# Patient Record
Sex: Female | Born: 1980 | Race: White | Hispanic: No | Marital: Married | Smoking: Former smoker
Health system: Southern US, Community
[De-identification: ages and names within clinical notes are randomized; demographics above are authoritative.]

## PROBLEM LIST (undated history)

## (undated) DIAGNOSIS — B354 Tinea corporis: Secondary | ICD-10-CM

## (undated) DIAGNOSIS — K5790 Diverticulosis of intestine, part unspecified, without perforation or abscess without bleeding: Secondary | ICD-10-CM

## (undated) DIAGNOSIS — F329 Major depressive disorder, single episode, unspecified: Secondary | ICD-10-CM

## (undated) DIAGNOSIS — G40909 Epilepsy, unspecified, not intractable, without status epilepticus: Secondary | ICD-10-CM

## (undated) DIAGNOSIS — F32A Depression, unspecified: Secondary | ICD-10-CM

## (undated) DIAGNOSIS — Z9884 Bariatric surgery status: Secondary | ICD-10-CM

## (undated) DIAGNOSIS — G4730 Sleep apnea, unspecified: Secondary | ICD-10-CM

## (undated) DIAGNOSIS — G40409 Other generalized epilepsy and epileptic syndromes, not intractable, without status epilepticus: Secondary | ICD-10-CM

## (undated) DIAGNOSIS — F419 Anxiety disorder, unspecified: Secondary | ICD-10-CM

## (undated) DIAGNOSIS — J45909 Unspecified asthma, uncomplicated: Secondary | ICD-10-CM

## (undated) DIAGNOSIS — F319 Bipolar disorder, unspecified: Secondary | ICD-10-CM

## (undated) DIAGNOSIS — R0600 Dyspnea, unspecified: Secondary | ICD-10-CM

## (undated) DIAGNOSIS — E119 Type 2 diabetes mellitus without complications: Secondary | ICD-10-CM

## (undated) DIAGNOSIS — J189 Pneumonia, unspecified organism: Secondary | ICD-10-CM

## (undated) DIAGNOSIS — J449 Chronic obstructive pulmonary disease, unspecified: Secondary | ICD-10-CM

## (undated) DIAGNOSIS — G43909 Migraine, unspecified, not intractable, without status migrainosus: Secondary | ICD-10-CM

## (undated) HISTORY — PX: CARPAL TUNNEL RELEASE: SHX101

## (undated) HISTORY — DX: Anxiety disorder, unspecified: F41.9

## (undated) HISTORY — PX: HERNIA REPAIR: SHX51

## (undated) HISTORY — DX: Tinea corporis: B35.4

## (undated) HISTORY — DX: Diverticulosis of intestine, part unspecified, without perforation or abscess without bleeding: K57.90

## (undated) HISTORY — DX: Chronic obstructive pulmonary disease, unspecified: J44.9

## (undated) HISTORY — DX: Bariatric surgery status: Z98.84

## (undated) HISTORY — PX: OTHER SURGICAL HISTORY: SHX169

---

## 2001-03-15 ENCOUNTER — Encounter: Payer: Self-pay | Admitting: Emergency Medicine

## 2001-03-15 ENCOUNTER — Emergency Department (HOSPITAL_COMMUNITY): Admission: EM | Admit: 2001-03-15 | Discharge: 2001-03-15 | Payer: Self-pay | Admitting: Emergency Medicine

## 2002-04-07 ENCOUNTER — Emergency Department (HOSPITAL_COMMUNITY): Admission: EM | Admit: 2002-04-07 | Discharge: 2002-04-08 | Payer: Self-pay

## 2002-08-03 ENCOUNTER — Encounter: Payer: Self-pay | Admitting: Emergency Medicine

## 2002-08-03 ENCOUNTER — Encounter: Payer: Self-pay | Admitting: Internal Medicine

## 2002-08-03 ENCOUNTER — Inpatient Hospital Stay (HOSPITAL_COMMUNITY): Admission: EM | Admit: 2002-08-03 | Discharge: 2002-08-05 | Payer: Self-pay

## 2002-08-05 ENCOUNTER — Encounter: Payer: Self-pay | Admitting: Internal Medicine

## 2002-08-07 ENCOUNTER — Emergency Department (HOSPITAL_COMMUNITY): Admission: EM | Admit: 2002-08-07 | Discharge: 2002-08-07 | Payer: Self-pay | Admitting: *Deleted

## 2002-08-22 ENCOUNTER — Ambulatory Visit (HOSPITAL_COMMUNITY): Admission: RE | Admit: 2002-08-22 | Discharge: 2002-08-22 | Payer: Self-pay | Admitting: Infectious Diseases

## 2002-08-22 ENCOUNTER — Encounter: Payer: Self-pay | Admitting: Infectious Diseases

## 2005-10-31 ENCOUNTER — Emergency Department (HOSPITAL_COMMUNITY): Admission: EM | Admit: 2005-10-31 | Discharge: 2005-10-31 | Payer: Self-pay | Admitting: Emergency Medicine

## 2006-04-07 ENCOUNTER — Ambulatory Visit: Payer: Self-pay | Admitting: Obstetrics & Gynecology

## 2006-04-08 ENCOUNTER — Ambulatory Visit: Payer: Self-pay | Admitting: Obstetrics & Gynecology

## 2006-04-08 ENCOUNTER — Encounter: Payer: Self-pay | Admitting: Obstetrics & Gynecology

## 2006-04-09 ENCOUNTER — Ambulatory Visit (HOSPITAL_COMMUNITY): Admission: RE | Admit: 2006-04-09 | Discharge: 2006-04-09 | Payer: Self-pay | Admitting: Gynecology

## 2006-04-15 ENCOUNTER — Ambulatory Visit: Payer: Self-pay | Admitting: Obstetrics & Gynecology

## 2006-04-26 ENCOUNTER — Ambulatory Visit: Payer: Self-pay | Admitting: Obstetrics & Gynecology

## 2006-05-12 ENCOUNTER — Ambulatory Visit (HOSPITAL_COMMUNITY): Admission: RE | Admit: 2006-05-12 | Discharge: 2006-05-12 | Payer: Self-pay | Admitting: Gynecology

## 2006-05-13 ENCOUNTER — Ambulatory Visit: Payer: Self-pay | Admitting: Obstetrics & Gynecology

## 2006-05-20 ENCOUNTER — Ambulatory Visit: Payer: Self-pay | Admitting: Obstetrics & Gynecology

## 2006-06-19 ENCOUNTER — Inpatient Hospital Stay (HOSPITAL_COMMUNITY): Admission: AD | Admit: 2006-06-19 | Discharge: 2006-06-19 | Payer: Self-pay | Admitting: Obstetrics & Gynecology

## 2006-07-01 ENCOUNTER — Ambulatory Visit: Payer: Self-pay | Admitting: Obstetrics & Gynecology

## 2006-07-15 ENCOUNTER — Ambulatory Visit: Payer: Self-pay | Admitting: Family Medicine

## 2006-07-29 ENCOUNTER — Ambulatory Visit: Payer: Self-pay | Admitting: Obstetrics & Gynecology

## 2006-08-03 ENCOUNTER — Ambulatory Visit (HOSPITAL_COMMUNITY): Admission: RE | Admit: 2006-08-03 | Discharge: 2006-08-03 | Payer: Self-pay | Admitting: Gynecology

## 2006-08-05 ENCOUNTER — Ambulatory Visit: Payer: Self-pay | Admitting: Obstetrics & Gynecology

## 2006-08-19 ENCOUNTER — Ambulatory Visit: Payer: Self-pay | Admitting: Obstetrics & Gynecology

## 2006-08-19 ENCOUNTER — Ambulatory Visit (HOSPITAL_COMMUNITY): Admission: RE | Admit: 2006-08-19 | Discharge: 2006-08-19 | Payer: Self-pay | Admitting: Obstetrics & Gynecology

## 2006-08-19 ENCOUNTER — Inpatient Hospital Stay (HOSPITAL_COMMUNITY): Admission: AD | Admit: 2006-08-19 | Discharge: 2006-08-19 | Payer: Self-pay | Admitting: Obstetrics & Gynecology

## 2006-08-26 ENCOUNTER — Ambulatory Visit: Payer: Self-pay | Admitting: Obstetrics & Gynecology

## 2006-09-16 ENCOUNTER — Inpatient Hospital Stay (HOSPITAL_COMMUNITY): Admission: AD | Admit: 2006-09-16 | Discharge: 2006-09-16 | Payer: Self-pay | Admitting: Family Medicine

## 2006-09-16 ENCOUNTER — Ambulatory Visit: Payer: Self-pay | Admitting: Family Medicine

## 2006-09-16 ENCOUNTER — Ambulatory Visit: Payer: Self-pay | Admitting: Obstetrics & Gynecology

## 2006-09-23 ENCOUNTER — Ambulatory Visit: Payer: Self-pay | Admitting: Obstetrics & Gynecology

## 2006-09-30 ENCOUNTER — Inpatient Hospital Stay (HOSPITAL_COMMUNITY): Admission: AD | Admit: 2006-09-30 | Discharge: 2006-10-02 | Payer: Self-pay | Admitting: Family Medicine

## 2006-09-30 ENCOUNTER — Ambulatory Visit: Payer: Self-pay | Admitting: Obstetrics & Gynecology

## 2006-11-18 ENCOUNTER — Ambulatory Visit: Payer: Self-pay | Admitting: Obstetrics & Gynecology

## 2006-11-26 ENCOUNTER — Emergency Department: Payer: Self-pay | Admitting: Emergency Medicine

## 2007-03-24 ENCOUNTER — Emergency Department: Payer: Self-pay | Admitting: Emergency Medicine

## 2007-04-14 ENCOUNTER — Encounter: Payer: Self-pay | Admitting: Obstetrics & Gynecology

## 2007-04-14 ENCOUNTER — Ambulatory Visit: Payer: Self-pay | Admitting: Obstetrics & Gynecology

## 2007-06-23 ENCOUNTER — Ambulatory Visit: Payer: Self-pay | Admitting: Obstetrics & Gynecology

## 2007-07-28 ENCOUNTER — Ambulatory Visit: Payer: Self-pay | Admitting: Obstetrics & Gynecology

## 2008-03-15 ENCOUNTER — Encounter: Payer: Self-pay | Admitting: Obstetrics & Gynecology

## 2008-03-15 ENCOUNTER — Ambulatory Visit: Payer: Self-pay | Admitting: Obstetrics & Gynecology

## 2008-05-16 ENCOUNTER — Ambulatory Visit: Payer: Self-pay | Admitting: Obstetrics & Gynecology

## 2008-05-30 ENCOUNTER — Ambulatory Visit: Payer: Self-pay | Admitting: Obstetrics & Gynecology

## 2008-06-01 ENCOUNTER — Encounter: Admission: RE | Admit: 2008-06-01 | Discharge: 2008-06-01 | Payer: Self-pay | Admitting: Obstetrics & Gynecology

## 2009-07-15 ENCOUNTER — Encounter: Payer: Self-pay | Admitting: Obstetrics & Gynecology

## 2009-07-15 ENCOUNTER — Ambulatory Visit: Payer: Self-pay | Admitting: Obstetrics & Gynecology

## 2009-09-04 ENCOUNTER — Ambulatory Visit: Payer: Self-pay | Admitting: Obstetrics & Gynecology

## 2010-01-09 ENCOUNTER — Ambulatory Visit: Payer: Self-pay | Admitting: Unknown Physician Specialty

## 2010-05-21 ENCOUNTER — Ambulatory Visit: Payer: Self-pay | Admitting: Unknown Physician Specialty

## 2010-09-01 ENCOUNTER — Emergency Department (HOSPITAL_COMMUNITY): Admission: EM | Admit: 2010-09-01 | Discharge: 2010-09-02 | Payer: Self-pay | Admitting: Emergency Medicine

## 2010-09-01 ENCOUNTER — Ambulatory Visit: Payer: Self-pay | Admitting: Internal Medicine

## 2010-10-01 ENCOUNTER — Ambulatory Visit: Payer: Self-pay | Admitting: Obstetrics & Gynecology

## 2010-12-21 ENCOUNTER — Emergency Department (HOSPITAL_COMMUNITY)
Admission: EM | Admit: 2010-12-21 | Discharge: 2010-12-22 | Payer: Self-pay | Source: Home / Self Care | Admitting: Internal Medicine

## 2010-12-29 LAB — CBC
HCT: 41.1 % (ref 36.0–46.0)
Hemoglobin: 13.5 g/dL (ref 12.0–15.0)
MCH: 28 pg (ref 26.0–34.0)
MCHC: 32.8 g/dL (ref 30.0–36.0)
MCV: 85.1 fL (ref 78.0–100.0)
Platelets: 275 10*3/uL (ref 150–400)
RBC: 4.83 MIL/uL (ref 3.87–5.11)
RDW: 13 % (ref 11.5–15.5)
WBC: 9.8 10*3/uL (ref 4.0–10.5)

## 2010-12-29 LAB — COMPREHENSIVE METABOLIC PANEL
ALT: 44 U/L — ABNORMAL HIGH (ref 0–35)
AST: 23 U/L (ref 0–37)
Albumin: 3.6 g/dL (ref 3.5–5.2)
Alkaline Phosphatase: 61 U/L (ref 39–117)
BUN: 10 mg/dL (ref 6–23)
CO2: 25 mEq/L (ref 19–32)
Calcium: 9 mg/dL (ref 8.4–10.5)
Chloride: 105 mEq/L (ref 96–112)
Creatinine, Ser: 0.68 mg/dL (ref 0.4–1.2)
GFR calc Af Amer: >60 mL/min (ref >60)
GFR calc non Af Amer: >60 mL/min (ref >60)
Glucose, Bld: 141 mg/dL — ABNORMAL HIGH (ref 70–99)
Potassium: 3.7 mEq/L (ref 3.5–5.1)
Sodium: 138 mEq/L (ref 135–145)
Total Bilirubin: 0.3 mg/dL (ref 0.3–1.2)
Total Protein: 6 g/dL (ref 6.0–8.3)

## 2010-12-29 LAB — POCT I-STAT, CHEM 8
BUN: 12 mg/dL (ref 6–23)
Calcium, Ion: 1.14 mmol/L (ref 1.12–1.32)
Chloride: 104 mEq/L (ref 96–112)
Creatinine, Ser: 0.7 mg/dL (ref 0.4–1.2)
Glucose, Bld: 138 mg/dL — ABNORMAL HIGH (ref 70–99)
HCT: 41 % (ref 36.0–46.0)
Hemoglobin: 13.9 g/dL (ref 12.0–15.0)
Potassium: 3.6 mEq/L (ref 3.5–5.1)
Sodium: 138 mEq/L (ref 135–145)
TCO2: 25 mmol/L (ref 0–100)

## 2010-12-29 LAB — DIFFERENTIAL
Basophils Absolute: 0.1 10*3/uL (ref 0.0–0.1)
Basophils Relative: 1 % (ref 0–1)
Eosinophils Absolute: 0.3 10*3/uL (ref 0.0–0.7)
Eosinophils Relative: 3 % (ref 0–5)
Lymphocytes Relative: 30 % (ref 12–46)
Lymphs Abs: 2.9 10*3/uL (ref 0.7–4.0)
Monocytes Absolute: 0.6 10*3/uL (ref 0.1–1.0)
Monocytes Relative: 7 % (ref 3–12)
Neutro Abs: 5.8 10*3/uL (ref 1.7–7.7)
Neutrophils Relative %: 60 % (ref 43–77)

## 2010-12-29 LAB — URINALYSIS, ROUTINE W REFLEX MICROSCOPIC
Bilirubin Urine: NEGATIVE
Ketones, ur: NEGATIVE mg/dL
Leukocytes, UA: NEGATIVE
Nitrite: NEGATIVE
Protein, ur: NEGATIVE mg/dL
Specific Gravity, Urine: 1.024 (ref 1.005–1.030)
Urine Glucose, Fasting: NEGATIVE mg/dL
Urobilinogen, UA: 0.2 mg/dL (ref 0.0–1.0)
pH: 6 (ref 5.0–8.0)

## 2010-12-29 LAB — URINE MICROSCOPIC-ADD ON

## 2010-12-29 LAB — POCT PREGNANCY, URINE: Preg Test, Ur: NEGATIVE

## 2011-01-06 ENCOUNTER — Ambulatory Visit: Payer: Self-pay | Admitting: Neurology

## 2011-01-07 ENCOUNTER — Ambulatory Visit: Payer: Self-pay | Admitting: Neurology

## 2011-02-26 LAB — DIFFERENTIAL
Basophils Absolute: 0.1 10*3/uL (ref 0.0–0.1)
Lymphocytes Relative: 34 % (ref 12–46)
Lymphs Abs: 3.3 10*3/uL (ref 0.7–4.0)
Neutro Abs: 5.5 10*3/uL (ref 1.7–7.7)
Neutrophils Relative %: 57 % (ref 43–77)

## 2011-02-26 LAB — BASIC METABOLIC PANEL
BUN: 8 mg/dL (ref 6–23)
CO2: 27 mEq/L (ref 19–32)
Calcium: 9.1 mg/dL (ref 8.4–10.5)
Chloride: 105 mEq/L (ref 96–112)
Creatinine, Ser: 0.74 mg/dL (ref 0.4–1.2)
GFR calc Af Amer: >60 mL/min (ref >60)
GFR calc non Af Amer: >60 mL/min (ref >60)
Glucose, Bld: 123 mg/dL — ABNORMAL HIGH (ref 70–99)
Potassium: 3.7 mEq/L (ref 3.5–5.1)
Sodium: 139 mEq/L (ref 135–145)

## 2011-02-26 LAB — RAPID URINE DRUG SCREEN, HOSP PERFORMED
Amphetamines: NOT DETECTED
Barbiturates: NOT DETECTED
Benzodiazepines: NOT DETECTED
Cocaine: NOT DETECTED
Opiates: NOT DETECTED

## 2011-02-26 LAB — CBC
MCV: 84.5 fL (ref 78.0–100.0)
Platelets: 292 10*3/uL (ref 150–400)
RBC: 4.93 MIL/uL (ref 3.87–5.11)
RDW: 13.2 % (ref 11.5–15.5)
WBC: 9.7 10*3/uL (ref 4.0–10.5)

## 2011-02-26 LAB — ETHANOL: Alcohol, Ethyl (B): <5 mg/dL (ref 0–10)

## 2011-03-01 ENCOUNTER — Other Ambulatory Visit: Payer: Self-pay | Admitting: Family Medicine

## 2011-03-02 NOTE — Telephone Encounter (Signed)
Refill request

## 2011-04-28 NOTE — Assessment & Plan Note (Signed)
NAMEPHILLIP, [name] NO.:  [id number]   MEDICAL RECORD NO.:  [id number]          PATIENT TYPE:  POB   LOCATION:  [facility] at [facility]         FACILITY:  [facility]   PHYSICIAN:  Scheryl Darter, MD       DATE OF BIRTH:  [birth year]   DATE OF SERVICE:  09/04/2009                                  CLINIC NOTE   The patient comes for insertion of Mirena.  The patient is a 30-year-  old, white female, gravida 1, para 1, last menstrual period 2 weeks ago  uses condoms for birth control.  She had a Mirena in place for about 2  years and was removed year ago when she had planned to conceive, but she  now wishes to have IUD replaced.  She says she did well with Mirena, had  extremely little menstrual bleeding.  She understands method of birth  control and the risks of bleeding, infection, uterine damage, pain or  failure.  She signed consent.  She states that she has had intercourse  since her last menses, but she use condoms.  She had negative pregnancy  test today.   PHYSICAL EXAMINATION:  GENERAL:  No acute distress.  Normal affect.  PELVIC:  External genitalia, vagina, and cervix appeared normal.  Cervix  was prepped with Betadine.  Single-tooth tenaculum grasped the anterior  lip of the cervix.  Uterus sounded to 8 cm.  Mirena was placed in the  usual fashion without problems.  String was trimmed to about 2-1/2 cm.   The patient tolerated the procedure well without complications.  She  will return in about a month to check placement of IUD, but she reported  she has fever, pain, abnormal discharge, or heavy bleeding.      Scheryl Darter, MD     JA/MEDQ  D:  09/04/2009  T:  09/05/2009  Job:  [id number]

## 2011-04-28 NOTE — Assessment & Plan Note (Signed)
Rachel [name], Rachel [name]                  ACCOUNT NO.:  [id number]   MEDICAL RECORD NO.:  [id number]          PATIENT TYPE:  POB   LOCATION:  [facility] at [facility]         FACILITY:  [facility]   PHYSICIAN:  Elsie Lincoln, MD      DATE OF BIRTH:  1981-11-24   DATE OF SERVICE:  10/01/2010                                  CLINIC NOTE   The patient comes back after a visit to the [facility]  for suicide ideations and the patient's mother had called me on the  emergency OB line saying that the patient had a plan for hurting  herself, that is why I advised her to go to [facility].  Unfortunately, she had a very bad experience there.  They had her wait 5  hours and the psychiatrist spent 5 minutes with her and suggested her to  go to [facility] for counseling.  Since she lives in [place],  this was obviously not suitable for her.  She found counseling on her  own at [facility] in [place].  She is seeing a  psychiatrist and a counselor there.  They diagnosed her with depressive  bipolar and placed her on Lamictal for stabilization.  She is also on  Celexa 20 mg.  She seems much better today.  She has no plans for  suicidal ideation or hurting others.  Of other notes, she needs a flu  shot today.  She needs to quit smoking.  She also needs to weight loss.  She is up-to-date on her Pap smear.  She has a Mirena IUD in situ.   VITAL SIGNS:  Pulse 96, blood pressure 116/82, weight 256, height 5 feet  5 inches.   ASSESSMENT AND PLAN:  A 30 year old female with depressive bipolar.  1. We will speak to [facility] Behavior[facility] about the poor      experience she had with the behavioral health physicians.  2. Continue to see in [facility].  3. Flu shot.  4. Continue intrauterine device.  5. Stenosis.  6. Weight loss.           ______________________________  Elsie Lincoln, MD     KL/MEDQ  D:  10/01/2010  T:  10/02/2010   Job:  [id number]

## 2011-04-28 NOTE — Assessment & Plan Note (Signed)
NAMEGWENN, Rachel [name]              ACCOUNT NO.:  [id number]   MEDICAL RECORD NO.:  [id number]          PATIENT TYPE:  POB   LOCATION:  [facility] at [facility]         FACILITY:  [facility]   PHYSICIAN:  Elsie Lincoln, MD      DATE OF BIRTH:  Apr 14, 1981   DATE OF SERVICE:                                  CLINIC NOTE   REASON FOR VISIT:  Depression and IUD string check.  The patient was  very sad several weeks ago and was given a prescription for Celexa.  She  never got it filled.  She also never Behavior Health; however, she is  much happier now.  She does not have suicidal ideation, and she is  dealing with her difficult social and home situations right now very  well.  She is not sexually active.  She is not doing IUD string checks,  so we did that today.  Her speculum exam and bimanual exam revealed  positive IUD strings.  The patient is up to date on her Pap smear, and  given that she is no longer depressed, I think that we should see her  for annual exam.  She was given all precautions in case her suicidal  ideations or depression returns.  The patient to return to clinic by May  2009.           ______________________________  Elsie Lincoln, MD     KL/MEDQ  D:  07/28/2007  T:  07/29/2007  Job:  [id number]

## 2011-04-28 NOTE — Assessment & Plan Note (Signed)
Rachel [name], Rachel [name] NO.:  [id number]   MEDICAL RECORD NO.:  [id number]          PATIENT TYPE:  POB   LOCATION:  [facility] at [facility]         FACILITY:  [facility]   PHYSICIAN:  Elsie Lincoln, MD      DATE OF BIRTH:  [birth year]   DATE OF SERVICE:                                  CLINIC NOTE   The patient is a 30 year old para 1 female, who presents for annual  exam.  The patient is also desiring a Mirena IUD for birth control.  She  has no complaints.  She has regular menses except for this past month.  She has not had her menses in July, but she did spot yesterday.  Her UPT  is negative today.  The patient did try to get pregnant for a year, but  was unsuccessful.  She thinks it is because of her husband because he  was hit by a roadside bomb in [place] and had some problems with urination  and his urologist told him that most likely he is infertile.  They have  come to terms with this and she likes the IUD for cycle control.  We  will place her back on this next month.  The patient has no other  complaints.   PAST MEDICAL HISTORY:  Depression, PCO, irritable bowel, obesity, breast  lump that came back benign.   PAST SURGICAL HISTORY:  Denies all surgeries.   GYNECOLOGIC HISTORY:  NSVD x1.  No history of sexually-transmitted  diseases, abnormal Pap smears, ovarian cysts or fibroid tumors.  The  patient does have polycystic ovarian syndrome, which has been controlled  on Glucophage in the past.  She is no longer on Glucophage.   MEDICATIONS:  Celexa and Allegra.   ALLERGIES:  PHENERGAN and HORMONAL BIRTH CONTROL PILLS.  No latex  allergy.   FAMILY HISTORY:  Positive for diabetes, chronic hypertension and heart  disease.  No familial cancers with breast cancer, ovarian cancer,  endometrial cancer or colon cancer.   REVIEW OF SYSTEMS:  Negative.   PHYSICAL EXAMINATION:  VITAL SIGNS:  Pulse 104, blood pressure 114/93,  weight 246, height 5 feet 5 inches.  GENERAL:  Obese, well nourished, well developed, no apparent distress.  HEENT:  Normocephalic, atraumatic.  Good dentition.  Thyroid, no masses.  LUNGS:  Clear to auscultation bilaterally.  HEART:  Regular rate and rhythm.  BREASTS:  No masses, nontender.  No lymphadenopathy, hair on midline  chest.  ABDOMEN:  Obese, soft, nontender.  No organomegaly.  No hernia.  GENITALIA:  Tanner V.  Some evidence of tinea corporis being treated by  dermatologist.  Vagina pink, normal rugae.  Cervix is closed, nontender.  Uterus and adnexa, no masses, nontender, but difficult to feel secondary  to habitus.  No rectocele or cystocele.  No hemorrhoids.  EXTREMITIES:  Nontender.   ASSESSMENT AND PLAN:  A 30 year old para 1-0-0-1 female for well-woman  exam.  1. Pap smear, GC and chlamydia.  2. The patient to come back for Mirena intrauterine device.  3. Weight loss.           ______________________________  Elsie Lincoln, MD  KL/MEDQ  D:  07/15/2009  T:  07/16/2009  Job:  [id number]

## 2011-04-28 NOTE — Assessment & Plan Note (Signed)
Rachel [name], MELLONE NO.:  [id number]   MEDICAL RECORD NO.:  [id number]          PATIENT TYPE:  POB   LOCATION:  [facility] at [facility]         FACILITY:  [facility]   PHYSICIAN:  Elsie Lincoln, MD      DATE OF BIRTH:  May 10, 1981   DATE OF SERVICE:                                  CLINIC NOTE   The patient is a 30 year old female, G1, P1 who presents for her yearly  exam.  The patient uses Mirena IUD for birth control.  She has 3  complaints.  Number one, she was bit by a tick a couple weeks ago.  There is no evidence of rash or problems.  I told her to go to her  primary care doctor if she develops a problem.  She is having allergic  rhinitis and has been on Claritin D in the past but has not helped.  She  is much better when it rains, and she is doing well today because it is  currently raining.  I will write a prescription for Allegra-D for this.  If she has any further problems, she should follow up with her primary  care doctor.  She is also complaining of irritability and moodiness and  would like to go back on her Celexa.  The Zoloft she said made her more  somnolent than the Celexa.  She has gotten married since we last met.  She met a man who recently got discharged from the [employer] and works  for [employer].  She moved in with him and seems to be very happy.  He seems to  be a good husband and father to her son, Rachel [name].  They may try for  another child in October.  So currently, she will keep her IUD in place.   PAST MEDICAL HISTORY:  No changes.   PAST SURGICAL HISTORY:  No changes.   MEDICATIONS:  None.   ALLERGIES:  None.   REVIEW OF SYSTEMS:  As above.   PHYSICAL EXAMINATION:  VITAL SIGNS:  Pulse 88, blood pressure 110/86,  weight 235, height 5 feet 5.  GENERAL:  Well-nourished, well-developed in no apparent distress.  HEENT:  Normocephalic, atraumatic.  THYROID:  No masses.  LUNGS:  Clear to auscultation bilaterally.  HEART:  Regular rate and  rhythm.  BREASTS:  Nontender.  No lymphadenopathy.  Positive hirsutism between  the breast on the sternum.  ABDOMEN:  Soft, nontender.  No organomegaly.  No hernia.  GENITALIA:  Tanner V.  Evidence of old folliculitis well healed.  The  patient advised not to shave.  Vagina pink.  Normal rugae.  Cervix IUD  strings seen approximately 2-3 cm long.  Cervix closed, nontender.  Uterus small, nontender.  Adnexa nonpalpable but nontender.  Bladder and  urethra is well-supported.  EXTREMITIES:  Nontender.  No edema.   ASSESSMENT/PLAN:  A 30 year old female here for yearly Pap smear.   1. Pap smear.  2. Gonococcus and Chlamydia.  3. Allegra for allergic rhinitis.  4. Celexa for depression and irritability.  5. Return to clinic in a year or p.r.n.           ______________________________  Elsie Lincoln, MD     KL/MEDQ  D:  03/15/2008  T:  03/15/2008  Job:  [id number]

## 2011-04-28 NOTE — Assessment & Plan Note (Signed)
NAMEBRITINY, DEFRAIN              ACCOUNT NO.:  [id number]   MEDICAL RECORD NO.:  [id number]          PATIENT TYPE:  POB   LOCATION:  [facility]         FACILITY:  [facility]   PHYSICIAN:  Elsie Lincoln, MD      DATE OF BIRTH:  [birth year]   DATE OF SERVICE:  06/23/2007                                  CLINIC NOTE   The patient is a 30 year old female, who presents for IUD insertion and  also for depression.  The patient was consented for the IUD, her UCG was  negative.  Under sterile procedure, an IUD was inserted after the uterus  sounded to 7 cm.  Strings were cut 4 cm long.  The patient understands  that she can still get pregnant and to take a pregnancy test if she  suspects she is pregnant.  She also understands there is a risk that it  could be an ectopic pregnancy.  The patient is not due for a Pap smear  until May of 2009.  The patient is having lots of depression, and she  has been off of her Zoloft for a month.  We will restart her on Celexa  because the Zoloft makes her lethargic.  The patient was suicidal  earlier this month but not right now and agrees to call 911 and be  admitted to [facility] if she becomes suicidal.  Her plan in the  past has been to run her car off the road.  She does not want to do that  again and is not any threat to anyone else.  The patient was restarted  on Celexa 20 mg a day because this worked better for her than Zoloft.  The patient is being referred to [facility] and to come back in  four weeks to evaluate depression and string testing.           ______________________________  Elsie Lincoln, MD     KL/MEDQ  D:  06/23/2007  T:  06/23/2007  Job:  [id number]

## 2011-05-01 NOTE — Assessment & Plan Note (Signed)
[name], [name]              ACCOUNT NO.:  [id number]   MEDICAL RECORD NO.:  [id number]          PATIENT TYPE:  POB   LOCATION:  [facility]         FACILITY:  [facility]   PHYSICIAN:  Elsie Lincoln, MD      DATE OF BIRTH:  Nov 04, 1981   DATE OF SERVICE:  11/18/2006                                  CLINIC NOTE   The patient is a 29 year old female who had her first baby several weeks  ago.  She is PCO and is continuing metformin.  She is not sexually  active yet.  She is breast feeding.  She is currently on her menses.  She is not having any pain in her vagina or pelvic organs; however, who  has had some pain in her lower back since the epidural.  Her mother,  [name], works in the operating room and they have both talked with [employer] who offered her pain pills, but she is only  taking ibuprofen for this.  If she has any further problems, I will  refer her to neurology.  The patient is dating a new man.  The father of  the baby is no longer in her life.  The father of the baby has had a  vasectomy so she is not interested in birth control; however, she is not  sexually active yet.  She does take Zoloft for depression; however, she  is not currently depressed right now.  The patient is excited about her  life.  The baby seems to be doing well but is having problems with  lactose intolerance.  She is getting a job and eventually going back to  school in the spring.  The patient is to come back in April for a Pap  smear and yearly exam.  Continue Metformin for her PCO.           ______________________________  Elsie Lincoln, MD     KL/MEDQ  D:  11/18/2006  T:  11/18/2006  Job:  [id number]

## 2011-06-03 ENCOUNTER — Ambulatory Visit: Payer: Self-pay | Admitting: Family Medicine

## 2011-06-09 ENCOUNTER — Ambulatory Visit (INDEPENDENT_AMBULATORY_CARE_PROVIDER_SITE_OTHER): Payer: BC Managed Care – PPO | Admitting: Family Medicine

## 2011-06-09 DIAGNOSIS — Z01419 Encounter for gynecological examination (general) (routine) without abnormal findings: Secondary | ICD-10-CM

## 2011-06-09 DIAGNOSIS — Z1272 Encounter for screening for malignant neoplasm of vagina: Secondary | ICD-10-CM

## 2011-06-11 ENCOUNTER — Ambulatory Visit: Payer: Self-pay | Admitting: Obstetrics & Gynecology

## 2011-06-11 NOTE — Assessment & Plan Note (Signed)
Rachel [name], Rachel [name] NO.:  [id number]  MEDICAL RECORD NO.:  [id number]           PATIENT TYPE:  LOCATION:  [facility] at [facility]           FACILITY:  PHYSICIAN:  Tinnie Gens, MD        DATE OF BIRTH:  08/14/1981  DATE OF SERVICE:  06/09/2011                                 CLINIC NOTE  CHIEF COMPLAINT:  Yearly exam.  HISTORY OF PRESENT ILLNESS:  The patient is a 30 year old gravida 1, para 1 who comes in today for physical exam.  She has been having multiple issues with seizure disorder.  She is now on Lamictal 800 mg a day, 200 q.i.d. and has had a 20-pound weight gain in the last 6 months. She attributes most of this to this medication.  This is still not controlling her seizures.  She has Mirena for cycle control and would like to continue this.  PAST MEDICAL HISTORY: 1. Depression. 2. PCO. 3. Irritable bowel. 4. Obesity. 5. Breast lump. 6. Seizure disorder.   PAST SURGICAL HISTORY:  Negative.  OBSTETRICAL HISTORY:  SVD x1.  GYN HISTORY:  No history of STDs or abnormal Pap smears.  MEDICATIONS:  She is on Celexa 20 mg daily, Allegra daily, Lamictal 200 mg q.i.d.  ALLERGIES:  PHENERGAN, OCP.  No latex allergies.  FAMILY HISTORY:  Diabetes, hypertension, heart disease.  No familial Cancers.  SOCIAL HISTORY:  Married, has one child, Husband has alcohol problems, no tobacco.  REVIEW OF SYSTEMS:  Reviewed.  She reports that she does not think her Celexa is doing enough to control her depression.  She is on Lamictal, which has been used as a mood stabilizer for her.  She is interested in Topamax potentially.  Otherwise, she denies headache, vision changes, abdominal pain, nausea, vomiting, diarrhea, constipation, dysuria, swelling of feet or ankles, breast mass, or tenderness.  PHYSICAL EXAMINATION:  VITAL SIGNS:  As noted in the chart.  Her weight is 275 pounds, blood pressure 190/75, pulse 105. GENERAL:  She is an obese female in no acute  distress. HEENT:  Normocephalic, atraumatic.  Sclerae anicteric. NECK:  Supple.  No thyroid. LUNGS:  Clear bilaterally. CV:  Regular rate and rhythm.  No murmurs, rubs, or gallops. ABDOMEN:  Soft, nontender, nondistended. GU:  Normal external female genitalia.  BUS is normal.  Vagina is pink and rugated.  Cervix is parous without lesion.  IUD strings are visualized.  Uterus small, anteverted.  No adnexal mass or tenderness.  IMPRESSION: 1. GYN exam with Pap smear. 2. Obesity. 3. Increasing seizure disorder. 4. Bipolar disorder with worsening depression.  PLAN: 1. Pap smear today. 2. Increase her Celexa 40 mg daily. 3. Neurologist to add Topamax to help control seizures, which may also     help with her weight. 4. Continue IUD.          ______________________________ Tinnie Gens, MD    TP/MEDQ  D:  06/09/2011  T:  06/10/2011  Job:  [id number]

## 2011-10-06 ENCOUNTER — Other Ambulatory Visit (INDEPENDENT_AMBULATORY_CARE_PROVIDER_SITE_OTHER): Payer: BC Managed Care – PPO

## 2011-10-06 DIAGNOSIS — Z23 Encounter for immunization: Secondary | ICD-10-CM

## 2011-11-12 ENCOUNTER — Telehealth: Payer: Self-pay | Admitting: *Deleted

## 2011-11-12 DIAGNOSIS — E8881 Metabolic syndrome: Secondary | ICD-10-CM

## 2011-11-12 MED ORDER — METFORMIN HCL 500 MG PO TABS: 500.0000 mg | ORAL_TABLET | Freq: Two times a day (BID) | ORAL | Status: DC

## 2011-11-12 NOTE — Telephone Encounter (Signed)
Patient would like to go back on Glucophage that she has taken in the past.  Ok per Dr. Shawnie Pons.

## 2012-05-24 ENCOUNTER — Telehealth: Payer: Self-pay | Admitting: *Deleted

## 2012-05-24 DIAGNOSIS — R21 Rash and other nonspecific skin eruption: Secondary | ICD-10-CM

## 2012-05-24 MED ORDER — CLOBETASOL PROPIONATE 0.05 % EX CREA: 1.0000 "application " | TOPICAL_CREAM | Freq: Two times a day (BID) | CUTANEOUS | Status: DC

## 2012-05-24 NOTE — Telephone Encounter (Signed)
Patient needs refill of her clobetasol for her rashes that she gets in her groin.

## 2012-06-24 ENCOUNTER — Other Ambulatory Visit: Payer: Self-pay | Admitting: Family Medicine

## 2012-12-14 HISTORY — PX: COLON SURGERY: SHX602

## 2012-12-20 ENCOUNTER — Telehealth: Payer: Self-pay | Admitting: *Deleted

## 2012-12-20 MED ORDER — CITALOPRAM HYDROBROMIDE 40 MG PO TABS: 40.0000 mg | ORAL_TABLET | Freq: Every day | ORAL | Status: DC

## 2012-12-20 NOTE — Telephone Encounter (Signed)
Patient needs refill of citalopram 40mg .

## 2012-12-20 NOTE — Telephone Encounter (Signed)
Patient needs citalopram called in, she currently does not have insurance so we need to call it into [facility] [address] [facility] of [facility].  She is awaiting her disability hearing for the 15th of January.

## 2013-05-24 ENCOUNTER — Telehealth: Payer: Self-pay | Admitting: *Deleted

## 2013-05-24 DIAGNOSIS — F419 Anxiety disorder, unspecified: Secondary | ICD-10-CM

## 2013-05-24 MED ORDER — CITALOPRAM HYDROBROMIDE 40 MG PO TABS: 40.0000 mg | ORAL_TABLET | Freq: Every day | ORAL | Status: DC

## 2013-05-24 NOTE — Telephone Encounter (Signed)
Patient needs refill of celexa, her insurance goes through in July and she will make an appointment to be see after July.

## 2013-06-04 ENCOUNTER — Encounter (HOSPITAL_COMMUNITY): Payer: Self-pay | Admitting: Emergency Medicine

## 2013-06-04 ENCOUNTER — Inpatient Hospital Stay (HOSPITAL_COMMUNITY)
Admission: EM | Admit: 2013-06-04 | Discharge: 2013-06-10 | DRG: 392 | Disposition: A | Discharge status: Home / Self Care | Payer: Medicaid Other | Attending: General Surgery | Admitting: General Surgery

## 2013-06-04 ENCOUNTER — Emergency Department (HOSPITAL_COMMUNITY): Payer: Medicaid Other

## 2013-06-04 DIAGNOSIS — Z6841 Body Mass Index (BMI) 40.0 and over, adult: Secondary | ICD-10-CM

## 2013-06-04 DIAGNOSIS — K5732 Diverticulitis of large intestine without perforation or abscess without bleeding: Principal | ICD-10-CM | POA: Diagnosis present

## 2013-06-04 DIAGNOSIS — F17200 Nicotine dependence, unspecified, uncomplicated: Secondary | ICD-10-CM | POA: Diagnosis present

## 2013-06-04 DIAGNOSIS — R569 Unspecified convulsions: Secondary | ICD-10-CM

## 2013-06-04 DIAGNOSIS — K5720 Diverticulitis of large intestine with perforation and abscess without bleeding: Secondary | ICD-10-CM

## 2013-06-04 HISTORY — DX: Migraine, unspecified, not intractable, without status migrainosus: G43.909

## 2013-06-04 HISTORY — DX: Depression, unspecified: F32.A

## 2013-06-04 HISTORY — DX: Epilepsy, unspecified, not intractable, without status epilepticus: G40.909

## 2013-06-04 HISTORY — DX: Other generalized epilepsy and epileptic syndromes, not intractable, without status epilepticus: G40.409

## 2013-06-04 HISTORY — DX: Major depressive disorder, single episode, unspecified: F32.9

## 2013-06-04 LAB — CBC WITH DIFFERENTIAL/PLATELET
Basophils Absolute: 0 10*3/uL (ref 0.0–0.1)
Eosinophils Absolute: 0.1 10*3/uL (ref 0.0–0.7)
Eosinophils Relative: 0 % (ref 0–5)
HCT: 44.8 % (ref 36.0–46.0)
Lymphocytes Relative: 11 % — ABNORMAL LOW (ref 12–46)
MCH: 28.6 pg (ref 26.0–34.0)
MCHC: 34.4 g/dL (ref 30.0–36.0)
MCV: 83.1 fL (ref 78.0–100.0)
Monocytes Absolute: 1.4 10*3/uL — ABNORMAL HIGH (ref 0.1–1.0)
Platelets: 281 10*3/uL (ref 150–400)
RDW: 13.1 % (ref 11.5–15.5)
WBC: 17.4 10*3/uL — ABNORMAL HIGH (ref 4.0–10.5)

## 2013-06-04 LAB — COMPREHENSIVE METABOLIC PANEL
AST: 14 U/L (ref 0–37)
CO2: 23 mEq/L (ref 19–32)
Calcium: 9.5 mg/dL (ref 8.4–10.5)
Creatinine, Ser: 0.54 mg/dL (ref 0.50–1.10)
GFR calc Af Amer: >90 mL/min (ref >90)
GFR calc non Af Amer: >90 mL/min (ref >90)
Glucose, Bld: 101 mg/dL — ABNORMAL HIGH (ref 70–99)
Total Protein: 8.1 g/dL (ref 6.0–8.3)

## 2013-06-04 LAB — CBC
HCT: 40.4 % (ref 36.0–46.0)
Hemoglobin: 13.6 g/dL (ref 12.0–15.0)
MCV: 84 fL (ref 78.0–100.0)
RDW: 13 % (ref 11.5–15.5)
WBC: 14.4 10*3/uL — ABNORMAL HIGH (ref 4.0–10.5)

## 2013-06-04 LAB — URINALYSIS, ROUTINE W REFLEX MICROSCOPIC
Protein, ur: 30 mg/dL — AB
Specific Gravity, Urine: 1.025 (ref 1.005–1.030)
Urobilinogen, UA: 1 mg/dL (ref 0.0–1.0)

## 2013-06-04 LAB — URINE MICROSCOPIC-ADD ON

## 2013-06-04 LAB — CREATININE, SERUM
GFR calc Af Amer: >90 mL/min (ref >90)
GFR calc non Af Amer: >90 mL/min (ref >90)

## 2013-06-04 MED ORDER — METRONIDAZOLE IN NACL 5-0.79 MG/ML-% IV SOLN
500.0000 mg | Freq: Three times a day (TID) | INTRAVENOUS | Status: DC
  Administered 2013-06-05 – 2013-06-10 (×16): 500 mg via INTRAVENOUS
  Filled 2013-06-04 (×18): qty 100

## 2013-06-04 MED ORDER — ENOXAPARIN SODIUM 40 MG/0.4ML ~~LOC~~ SOLN
40.0000 mg | SUBCUTANEOUS | Status: DC
  Administered 2013-06-04 – 2013-06-09 (×6): 40 mg via SUBCUTANEOUS
  Filled 2013-06-04 (×9): qty 0.4

## 2013-06-04 MED ORDER — IOHEXOL 300 MG/ML  SOLN
25.0000 mL | INTRAMUSCULAR | Status: DC | PRN
  Administered 2013-06-04: 25 mL via ORAL

## 2013-06-04 MED ORDER — DEXTROSE-NACL 5-0.9 % IV SOLN
INTRAVENOUS | Status: DC
  Administered 2013-06-04 – 2013-06-06 (×4): via INTRAVENOUS
  Administered 2013-06-06: 125 mL/h via INTRAVENOUS
  Administered 2013-06-06 – 2013-06-09 (×8): via INTRAVENOUS

## 2013-06-04 MED ORDER — DIPHENHYDRAMINE HCL 50 MG/ML IJ SOLN
12.5000 mg | Freq: Four times a day (QID) | INTRAMUSCULAR | Status: DC | PRN
  Administered 2013-06-05: 12.5 mg via INTRAVENOUS
  Administered 2013-06-08: 25 mg via INTRAVENOUS
  Administered 2013-06-08: 12.5 mg via INTRAVENOUS
  Administered 2013-06-09: 25 mg via INTRAVENOUS
  Filled 2013-06-04 (×4): qty 1

## 2013-06-04 MED ORDER — ONDANSETRON HCL 4 MG/2ML IJ SOLN
4.0000 mg | Freq: Once | INTRAMUSCULAR | Status: AC
  Administered 2013-06-04: 4 mg via INTRAVENOUS
  Filled 2013-06-04: qty 2

## 2013-06-04 MED ORDER — SODIUM CHLORIDE 0.9 % IV BOLUS (SEPSIS)
1000.0000 mL | Freq: Once | INTRAVENOUS | Status: AC
  Administered 2013-06-04: 1000 mL via INTRAVENOUS

## 2013-06-04 MED ORDER — HYDROMORPHONE HCL PF 1 MG/ML IJ SOLN
1.0000 mg | Freq: Once | INTRAMUSCULAR | Status: AC
  Administered 2013-06-04: 1 mg via INTRAVENOUS
  Filled 2013-06-04: qty 1

## 2013-06-04 MED ORDER — HYDROMORPHONE HCL PF 1 MG/ML IJ SOLN
1.0000 mg | INTRAMUSCULAR | Status: DC | PRN
  Administered 2013-06-04 – 2013-06-05 (×3): 2 mg via INTRAVENOUS
  Administered 2013-06-05: 1 mg via INTRAVENOUS
  Administered 2013-06-05 (×4): 2 mg via INTRAVENOUS
  Administered 2013-06-05 – 2013-06-08 (×9): 1 mg via INTRAVENOUS
  Administered 2013-06-08: 2 mg via INTRAVENOUS
  Administered 2013-06-08: 1 mg via INTRAVENOUS
  Administered 2013-06-08: 2 mg via INTRAVENOUS
  Administered 2013-06-08: 1 mg via INTRAVENOUS
  Administered 2013-06-08: 2 mg via INTRAVENOUS
  Administered 2013-06-09: 1 mg via INTRAVENOUS
  Filled 2013-06-04 (×3): qty 2
  Filled 2013-06-04 (×2): qty 1
  Filled 2013-06-04: qty 2
  Filled 2013-06-04 (×4): qty 1
  Filled 2013-06-04 (×3): qty 2
  Filled 2013-06-04: qty 1
  Filled 2013-06-04 (×2): qty 2
  Filled 2013-06-04 (×3): qty 1
  Filled 2013-06-04: qty 2
  Filled 2013-06-04 (×5): qty 1

## 2013-06-04 MED ORDER — DIPHENHYDRAMINE HCL 12.5 MG/5ML PO ELIX: 12.5000 mg | ORAL_SOLUTION | Freq: Four times a day (QID) | ORAL | Status: DC | PRN

## 2013-06-04 MED ORDER — IOHEXOL 300 MG/ML  SOLN
100.0000 mL | Freq: Once | INTRAMUSCULAR | Status: AC | PRN
  Administered 2013-06-04: 100 mL via INTRAVENOUS

## 2013-06-04 MED ORDER — LEVETIRACETAM ER 500 MG PO TB24
500.0000 mg | ORAL_TABLET | Freq: Every day | ORAL | Status: DC
  Administered 2013-06-04 – 2013-06-06 (×3): 500 mg via ORAL
  Filled 2013-06-04 (×7): qty 1

## 2013-06-04 MED ORDER — SODIUM CHLORIDE 0.9 % IV SOLN
1.0000 g | INTRAVENOUS | Status: DC
  Administered 2013-06-04: 1 g via INTRAVENOUS
  Filled 2013-06-04: qty 1

## 2013-06-04 MED ORDER — CIPROFLOXACIN IN D5W 400 MG/200ML IV SOLN
400.0000 mg | Freq: Two times a day (BID) | INTRAVENOUS | Status: DC
  Administered 2013-06-04 – 2013-06-10 (×12): 400 mg via INTRAVENOUS
  Filled 2013-06-04 (×12): qty 200

## 2013-06-04 MED ORDER — FENTANYL CITRATE 0.05 MG/ML IJ SOLN
50.0000 ug | Freq: Once | INTRAMUSCULAR | Status: AC
  Administered 2013-06-04: 50 ug via INTRAVENOUS
  Filled 2013-06-04: qty 2

## 2013-06-04 MED ORDER — PANTOPRAZOLE SODIUM 40 MG IV SOLR
40.0000 mg | Freq: Every day | INTRAVENOUS | Status: DC
  Administered 2013-06-04 – 2013-06-09 (×6): 40 mg via INTRAVENOUS
  Filled 2013-06-04 (×9): qty 40

## 2013-06-04 MED ORDER — LAMOTRIGINE 200 MG PO TABS
400.0000 mg | ORAL_TABLET | Freq: Two times a day (BID) | ORAL | Status: DC
  Filled 2013-06-04 (×13): qty 2

## 2013-06-04 MED ORDER — ONDANSETRON HCL 4 MG/2ML IJ SOLN
4.0000 mg | Freq: Four times a day (QID) | INTRAMUSCULAR | Status: DC | PRN
  Administered 2013-06-04 – 2013-06-05 (×2): 4 mg via INTRAVENOUS
  Filled 2013-06-04 (×2): qty 2

## 2013-06-04 NOTE — ED Notes (Addendum)
Pt given ice chips. Lab at bedside.  Pt's mother and husband at bedside.

## 2013-06-04 NOTE — ED Provider Notes (Signed)
History     CSN: [id number]  Arrival date & time 06/04/13  1157   First MD Initiated Contact with Patient 06/04/13 1215      Chief Complaint  Patient presents with  . Abdominal Pain    (Consider location/radiation/quality/duration/timing/severity/associated sxs/prior treatment) Patient is a 32 y.o. female presenting with abdominal pain.  Abdominal Pain Associated symptoms include abdominal pain.   Pt with no significant PMH reports several days of gradually worsening bilateral lower abdominal pain, particularly severe today, unable to find a comfortable position. Symptoms associated with nausea, no vomiting diarrhea or constipation. No dysuria or hematuria. No vaginal bleeding or discharge. She has IUD in place x4 years and no longer has regular menses. She has had low grade fever at home since yesterday.   Past Medical History  Diagnosis Date  . Seizures     Past Surgical History  Procedure Laterality Date  . Carpal tunnel release      No family history on file.  History  Substance Use Topics  . Smoking status: Current Every Day Smoker  . Smokeless tobacco: Not on file  . Alcohol Use: No    OB History   Grav Para Term Preterm Abortions TAB SAB Ect Mult Living                  Review of Systems  Gastrointestinal: Positive for abdominal pain.   All other systems reviewed and are negative except as noted in HPI.   Allergies  Morphine and related and Phenergan  Home Medications   Current Outpatient Rx  Name  Route  Sig  Dispense  Refill  . citalopram (CELEXA) 40 MG tablet   Oral   Take 1 tablet (40 mg total) by mouth daily.   30 tablet   2   . clobetasol cream (TEMOVATE) 0.05 %   Topical   Apply 1 application topically 2 (two) times daily.   30 g   6   . EXPIRED: metFORMIN (GLUCOPHAGE) 500 MG tablet   Oral   Take 1 tablet (500 mg total) by mouth 2 (two) times daily with a meal.   60 tablet   11     BP 112/94  Pulse 125  Temp(Src) 100 F  (37.8 C) (Oral)  Resp 24  SpO2 100%  Physical Exam  Nursing note and vitals reviewed. Constitutional: She is oriented to person, place, and time. She appears well-developed and well-nourished. She appears distressed.  HENT:  Head: Normocephalic and atraumatic.  Eyes: EOM are normal. Pupils are equal, round, and reactive to light.  Neck: Normal range of motion. Neck supple.  Cardiovascular: Normal rate, normal heart sounds and intact distal pulses.   Pulmonary/Chest: Effort normal and breath sounds normal.  Abdominal: Bowel sounds are normal. She exhibits no distension. There is tenderness (bilateral lower abdomen L>R). There is guarding. There is no rebound.  Musculoskeletal: Normal range of motion. She exhibits no edema and no tenderness.  Neurological: She is alert and oriented to person, place, and time. She has normal strength. No cranial nerve deficit or sensory deficit.  Skin: Skin is warm and dry. No rash noted.  Psychiatric: She has a normal mood and affect.    ED Course  Procedures (including critical care time)  Labs Reviewed  CBC WITH DIFFERENTIAL - Abnormal; Notable for the following:    WBC 17.4 (*)    RBC 5.39 (*)    Hemoglobin 15.4 (*)    Neutrophils Relative % 81 (*)  Neutro Abs 14.1 (*)    Lymphocytes Relative 11 (*)    Monocytes Absolute 1.4 (*)    All other components within normal limits  COMPREHENSIVE METABOLIC PANEL - Abnormal; Notable for the following:    Glucose, Bld 101 (*)    BUN 5 (*)    All other components within normal limits  URINALYSIS, ROUTINE W REFLEX MICROSCOPIC - Abnormal; Notable for the following:    Color, Urine ORANGE (*)    APPearance CLOUDY (*)    Hgb urine dipstick SMALL (*)    Bilirubin Urine MODERATE (*)    Ketones, ur >80 (*)    Protein, ur 30 (*)    Leukocytes, UA SMALL (*)    All other components within normal limits  LIPASE, BLOOD  URINE MICROSCOPIC-ADD ON  POCT PREGNANCY, URINE   Ct Abdomen Pelvis W  Contrast  06/04/2013   *RADIOLOGY REPORT*  Clinical Data: Lower abdominal pain and fever  CT ABDOMEN AND PELVIS WITH CONTRAST  Technique:  Multidetector CT imaging of the abdomen and pelvis was performed following the standard protocol during bolus administration of intravenous contrast.  Contrast: OMNIPAQUE IOHEXOL 300 MG/ML  SOLN  Comparison: CT abdomen 08/19/2006  Findings: Lung bases are clear.  There is no pericardial fluid. There is gas surrounding the distal esophagus at the GE junction. The gas tracks inferiorly along the left retroperitoneal space to the level of the sigmoid colon. The transverse portion of the sigmoid colon is inflamed consistent with acute diverticulitis.  There is there is mild pericolonic stranding at t level of the transverse portion the sigmoid colon.  There are several diverticula through this region.  There is a gas tract evident on the sagittal projection along the mesenteric border (image 78).  This is consistent with a perforation of the sigmoid colon with tracking of the gas within the retroperitoneum extending to the GE junction.  The pancreas is elevated by this dissecting gas collection also which also surrounds upper kidney.  The liver, gallbladder, pancreas, spleen, adrenal glands, and kidneys are normal.  Abdominal aorta is normal in caliber.  There is no free fluid pelvis the uterus and ovaries are normal.  The bladder is normal. IUD in expected location within the uterus.  IMPRESSION:  1.  Acute sigmoid diverticulitis with perforation. 2.  Perforation recurrence along mesenteric border of the transverse sigmoid colon and dissects into the retroperitoneum to the level of the GE junction. 3.  No evidence of abscess formation.  Findings discussed with  Dr. Bernette Mayers on 06/04/2013 at 1505 hours   Original Report Authenticated By: Genevive Bi, M.D.     1. Diverticulitis of colon with perforation       MDM  Reviewed CT images with radiologist. There is  sigmoid diverticulitis with perforation and free air in the retroperitoneum. Discussed with General Surgery on call. Invanz ordered. Pt's pain improved. Discussed these findings and plan with the patient and husband at bedside.   CRITICAL CARE Performed by: Pollyann Savoy Total critical care time: 45 Critical care time was exclusive of separately billable procedures and treating other patients. Critical care was necessary to treat or prevent imminent or life-threatening deterioration. Critical care was time spent personally by me on the following activities: development of treatment plan with patient and/or surrogate as well as nursing, discussions with consultants, evaluation of patient's response to treatment, examination of patient, obtaining history from patient or surrogate, ordering and performing treatments and interventions, ordering and review of laboratory studies, ordering and  review of radiographic studies, pulse oximetry and re-evaluation of patient's condition.       Hau Sanor B. Bernette Mayers, MD 06/04/13 1815

## 2013-06-04 NOTE — ED Notes (Signed)
Pt reports having severe abdominal pain onset Thursday. Pt reports pain is mainly in the lower quadrants. Pt took some of her husband's pain medication at 0200. Pt reports woke in sweat. Pt c/o nausea. Pt denies urinary problems. Pt reports temp a home 100.0.

## 2013-06-04 NOTE — ED Notes (Signed)
Pt given pillow.

## 2013-06-04 NOTE — ED Notes (Signed)
Surgeon at bedside and informed pt that she would be admitted to hospital for IV antibiotics and not going to OR at this time.  Pt asked surgeon about eating and drinking and he states pt is to be kept NPO at this time.

## 2013-06-04 NOTE — ED Notes (Signed)
Awaiting bed assignment and meds from pharmacy

## 2013-06-04 NOTE — ED Notes (Signed)
Pt asked "can I have something to drink." Nt told pt that she could not have anything to drink right now per the surgeon. Pt was offered a mouth swab, pt stated that that was "okay". When NT walked back into the pt room with the mouth swab pt was up and out of bed drinking water from the sink. Pt advised again that she shouldn't drink anything as the doctor requested. Pt stated "I don't care." RN and EDP Bernette Mayers made aware.

## 2013-06-04 NOTE — H&P (Signed)
Rachel [name] is an 32 y.o. female.   Chief Complaint: Abdominal pain HPI: the patient is a 32 year old female with a 3 to four-day history of abdominal pain that is localized left lower quadrant. The patient states overnight the pain has become more intense and she presented to the ED. Upon arrival in the ED patient underwent CT scan which revealed diverticulitis in the sigmoid colon along with some free air.  The patient states she's never had an episode similar to this. She also states that she has had some loose bowel movements which are normal for her, has had some nausea but no emesis.  Past Medical History  Diagnosis Date  . Seizures     Past Surgical History  Procedure Laterality Date  . Carpal tunnel release      No family history on file. Social History:  reports that she has been smoking.  She does not have any smokeless tobacco history on file. She reports that she does not drink alcohol or use illicit drugs.  Allergies:  Allergies  Allergen Reactions  . Morphine And Related     itching  . Phenergan (Promethazine Hcl)     vomiting     (Not in a hospital admission)  Results for orders placed during the hospital encounter of 06/04/13 (from the past 48 hour(s))  CBC WITH DIFFERENTIAL     Status: Abnormal   Collection Time    06/04/13 12:45 PM      Result Value Range   WBC 17.4 (*) 4.0 - 10.5 K/uL   RBC 5.39 (*) 3.87 - 5.11 MIL/uL   Hemoglobin 15.4 (*) 12.0 - 15.0 g/dL   HCT 16.1  09.6 - 04.5 %   MCV 83.1  78.0 - 100.0 fL   MCH 28.6  26.0 - 34.0 pg   MCHC 34.4  30.0 - 36.0 g/dL   RDW 40.9  81.1 - 91.4 %   Platelets 281  150 - 400 K/uL   Neutrophils Relative % 81 (*) 43 - 77 %   Neutro Abs 14.1 (*) 1.7 - 7.7 K/uL   Lymphocytes Relative 11 (*) 12 - 46 %   Lymphs Abs 1.9  0.7 - 4.0 K/uL   Monocytes Relative 8  3 - 12 %   Monocytes Absolute 1.4 (*) 0.1 - 1.0 K/uL   Eosinophils Relative 0  0 - 5 %   Eosinophils Absolute 0.1  0.0 - 0.7 K/uL   Basophils Relative 0   0 - 1 %   Basophils Absolute 0.0  0.0 - 0.1 K/uL  COMPREHENSIVE METABOLIC PANEL     Status: Abnormal   Collection Time    06/04/13 12:45 PM      Result Value Range   Sodium 135  135 - 145 mEq/L   Potassium 3.9  3.5 - 5.1 mEq/L   Chloride 98  96 - 112 mEq/L   CO2 23  19 - 32 mEq/L   Glucose, Bld 101 (*) 70 - 99 mg/dL   BUN 5 (*) 6 - 23 mg/dL   Creatinine, Ser 7.82  0.50 - 1.10 mg/dL   Calcium 9.5  8.4 - 95.6 mg/dL   Total Protein 8.1  6.0 - 8.3 g/dL   Albumin 3.8  3.5 - 5.2 g/dL   AST 14  0 - 37 U/L   ALT 22  0 - 35 U/L   Alkaline Phosphatase 84  39 - 117 U/L   Total Bilirubin 0.9  0.3 - 1.2 mg/dL  GFR calc non Af Amer >90  >90 mL/min   GFR calc Af Amer >90  >90 mL/min   Comment:            The eGFR has been calculated     using the CKD EPI equation.     This calculation has not been     validated in all clinical     situations.     eGFR's persistently     <90 mL/min signify     possible Chronic Kidney Disease.  LIPASE, BLOOD     Status: None   Collection Time    06/04/13 12:45 PM      Result Value Range   Lipase 21  11 - 59 U/L  URINALYSIS, ROUTINE W REFLEX MICROSCOPIC     Status: Abnormal   Collection Time    06/04/13 12:50 PM      Result Value Range   Color, Urine ORANGE (*) YELLOW   Comment: BIOCHEMICALS MAY BE AFFECTED BY COLOR   APPearance CLOUDY (*) CLEAR   Specific Gravity, Urine 1.025  1.005 - 1.030   pH 7.5  5.0 - 8.0   Glucose, UA NEGATIVE  NEGATIVE mg/dL   Hgb urine dipstick SMALL (*) NEGATIVE   Bilirubin Urine MODERATE (*) NEGATIVE   Ketones, ur >80 (*) NEGATIVE mg/dL   Protein, ur 30 (*) NEGATIVE mg/dL   Urobilinogen, UA 1.0  0.0 - 1.0 mg/dL   Nitrite NEGATIVE  NEGATIVE   Leukocytes, UA SMALL (*) NEGATIVE  URINE MICROSCOPIC-ADD ON     Status: None   Collection Time    06/04/13 12:50 PM      Result Value Range   Squamous Epithelial / LPF RARE  RARE   WBC, UA 0-2  <3 WBC/hpf   RBC / HPF 3-6  <3 RBC/hpf   Urine-Other MUCOUS PRESENT    POCT  PREGNANCY, URINE     Status: None   Collection Time    06/04/13 12:56 PM      Result Value Range   Preg Test, Ur NEGATIVE  NEGATIVE   Comment:            THE SENSITIVITY OF THIS     METHODOLOGY IS >24 mIU/mL   Ct Abdomen Pelvis W Contrast  06/04/2013   *RADIOLOGY REPORT*  Clinical Data: Lower abdominal pain and fever  CT ABDOMEN AND PELVIS WITH CONTRAST  Technique:  Multidetector CT imaging of the abdomen and pelvis was performed following the standard protocol during bolus administration of intravenous contrast.  Contrast: OMNIPAQUE IOHEXOL 300 MG/ML  SOLN  Comparison: CT abdomen 08/19/2006  Findings: Lung bases are clear.  There is no pericardial fluid. There is gas surrounding the distal esophagus at the GE junction. The gas tracks inferiorly along the left retroperitoneal space to the level of the sigmoid colon. The transverse portion of the sigmoid colon is inflamed consistent with acute diverticulitis.  There is there is mild pericolonic stranding at t level of the transverse portion the sigmoid colon.  There are several diverticula through this region.  There is a gas tract evident on the sagittal projection along the mesenteric border (image 78).  This is consistent with a perforation of the sigmoid colon with tracking of the gas within the retroperitoneum extending to the GE junction.  The pancreas is elevated by this dissecting gas collection also which also surrounds upper kidney.  The liver, gallbladder, pancreas, spleen, adrenal glands, and kidneys are normal.  Abdominal aorta is normal in  caliber.  There is no free fluid pelvis the uterus and ovaries are normal.  The bladder is normal. IUD in expected location within the uterus.  IMPRESSION:  1.  Acute sigmoid diverticulitis with perforation. 2.  Perforation recurrence along mesenteric border of the transverse sigmoid colon and dissects into the retroperitoneum to the level of the GE junction. 3.  No evidence of abscess formation.   Findings discussed with  Dr. Bernette Mayers on 06/04/2013 at 1505 hours   Original Report Authenticated By: Genevive Bi, M.D.    Review of Systems  Constitutional: Negative.   HENT: Negative.   Eyes: Negative.   Respiratory: Negative.   Cardiovascular: Negative.   Gastrointestinal: Positive for nausea, abdominal pain and diarrhea. Negative for vomiting, blood in stool and melena.  Genitourinary: Negative.   Musculoskeletal: Negative.   Skin: Negative.   Neurological: Negative.   All other systems reviewed and are negative.    Blood pressure 112/94, pulse 125, temperature 100 F (37.8 C), temperature source Oral, resp. rate 24, SpO2 100.00%. Physical Exam  Vitals reviewed. Constitutional: She is oriented to person, place, and time. She appears well-developed and well-nourished.  HENT:  Head: Normocephalic and atraumatic.  Eyes: Conjunctivae and EOM are normal. Pupils are equal, round, and reactive to light.  Neck: Normal range of motion. Neck supple.  Cardiovascular: Normal rate and regular rhythm.   Respiratory: Effort normal and breath sounds normal.  GI: Soft. Bowel sounds are normal. She exhibits no distension. There is tenderness (LLQ point tenderness). There is no rebound and no guarding.    Musculoskeletal: Normal range of motion.  Neurological: She is alert and oriented to person, place, and time.  Skin: Skin is warm and dry.     Assessment/Plan The patient is a 32 year old female with sigmoid diverticulitis. There is no abscess that can be seen on CT scan, however there is some free air.  1. Will admit patient to the floor serial abdominal exams 2. We'll begin IV antibiotics, Cipro Flagyl 3. Will obtain adequate pain control 4. I discussed with the patient should her condition she may need exploratory laparotomy and possible ostomy, and her and her husband both understand.  Marigene Ehlers., Dagmawi Venable 06/04/2013, 3:58 PM

## 2013-06-05 ENCOUNTER — Encounter (HOSPITAL_COMMUNITY): Payer: Self-pay | Admitting: General Practice

## 2013-06-05 LAB — BASIC METABOLIC PANEL
BUN: 4 mg/dL — ABNORMAL LOW (ref 6–23)
CO2: 26 mEq/L (ref 19–32)
Chloride: 97 mEq/L (ref 96–112)
GFR calc non Af Amer: >90 mL/min (ref >90)
Glucose, Bld: 111 mg/dL — ABNORMAL HIGH (ref 70–99)
Potassium: 4 mEq/L (ref 3.5–5.1)
Sodium: 131 mEq/L — ABNORMAL LOW (ref 135–145)

## 2013-06-05 LAB — CBC
HCT: 39.2 % (ref 36.0–46.0)
Hemoglobin: 12.6 g/dL (ref 12.0–15.0)
RBC: 4.56 MIL/uL (ref 3.87–5.11)
WBC: 16.5 10*3/uL — ABNORMAL HIGH (ref 4.0–10.5)

## 2013-06-05 MED ORDER — PNEUMOCOCCAL VAC POLYVALENT 25 MCG/0.5ML IJ INJ
0.5000 mL | INJECTION | INTRAMUSCULAR | Status: AC
  Administered 2013-06-06: 0.5 mL via INTRAMUSCULAR
  Filled 2013-06-05: qty 0.5

## 2013-06-05 NOTE — Progress Notes (Signed)
[facility] Progress Note    Subjective: The patient is miserable.  Still having significant pain, but states that it is somewhat better than yesterday.  Objective: Vital signs in last 24 hours: Temp:  [97.6 F (36.4 C)-100 F (37.8 C)] 97.6 F (36.4 C) (06/23 0945) Pulse Rate:  [84-125] 84 (06/23 0945) Resp:  [16-24] 18 (06/23 0945) BP: (98-131)/(55-94) 98/56 mmHg (06/23 0945) SpO2:  [93 %-100 %] 96 % (06/23 0945) Weight:  [129.275 kg (285 lb)] 129.275 kg (285 lb) (06/22 1654) Last BM Date: 06/04/13  Intake/Output from previous day: 06/22 0701 - 06/23 0700 In: 1485.4 [I.V.:1385.4; IV Piggyback:100] Out: -  Intake/Output this shift:    General: Lying on her stomach, feels better that way.  Generally seems very uncomfortable  Lungs: Clear  Abd: Soft, very tender in lower abdomen.  Has guarding in lower abdomen.  Has some bowel sounds.  Extremities: No DVT signs or symptoms  Neuro: Intact  Lab Results:  @LABLAST2 (wbc:2,hgb:2,hct:2,plt:2) BMET  Recent Labs  06/04/13 1245 06/04/13 1646 06/05/13 0538  NA 135  --  131*  K 3.9  --  4.0  CL 98  --  97  CO2 23  --  26  GLUCOSE 101*  --  111*  BUN 5*  --  4*  CREATININE 0.54 0.54 0.59  CALCIUM 9.5  --  8.4   PT/INR No results found for this basename: LABPROT, INR,  in the last 72 hours ABG No results found for this basename: PHART, PCO2, PO2, HCO3,  in the last 72 hours  Studies/Results: Ct Abdomen Pelvis W Contrast  06/04/2013   *RADIOLOGY REPORT*  Clinical Data: Lower abdominal pain and fever  CT ABDOMEN AND PELVIS WITH CONTRAST  Technique:  Multidetector CT imaging of the abdomen and pelvis was performed following the standard protocol during bolus administration of intravenous contrast.  Contrast: OMNIPAQUE IOHEXOL 300 MG/ML  SOLN  Comparison: CT abdomen 08/19/2006  Findings: Lung bases are clear.  There is no pericardial fluid. There is gas surrounding the distal esophagus at the GE junction. The gas tracks  inferiorly along the left retroperitoneal space to the level of the sigmoid colon. The transverse portion of the sigmoid colon is inflamed consistent with acute diverticulitis.  There is there is mild pericolonic stranding at t level of the transverse portion the sigmoid colon.  There are several diverticula through this region.  There is a gas tract evident on the sagittal projection along the mesenteric border (image 78).  This is consistent with a perforation of the sigmoid colon with tracking of the gas within the retroperitoneum extending to the GE junction.  The pancreas is elevated by this dissecting gas collection also which also surrounds upper kidney.  The liver, gallbladder, pancreas, spleen, adrenal glands, and kidneys are normal.  Abdominal aorta is normal in caliber.  There is no free fluid pelvis the uterus and ovaries are normal.  The bladder is normal. IUD in expected location within the uterus.  IMPRESSION:  1.  Acute sigmoid diverticulitis with perforation. 2.  Perforation recurrence along mesenteric border of the transverse sigmoid colon and dissects into the retroperitoneum to the level of the GE junction. 3.  No evidence of abscess formation.  Findings discussed with  Dr. Bernette Mayers on 06/04/2013 at 1505 hours   Original Report Authenticated By: Genevive Bi, M.D.    Anti-infectives: Anti-infectives   Start     Dose/Rate Route Frequency Ordered Stop   06/04/13 1730  ciprofloxacin (CIPRO) IVPB 400 mg  400 mg 200 mL/hr over 60 Minutes Intravenous Every 12 hours 06/04/13 1615     06/04/13 1630  metroNIDAZOLE (FLAGYL) IVPB 500 mg     500 mg 100 mL/hr over 60 Minutes Intravenous Every 8 hours 06/04/13 1615     06/04/13 1600  ertapenem (INVANZ) 1 g in sodium chloride 0.9 % 50 mL IVPB  Status:  Discontinued     1 g 100 mL/hr over 30 Minutes Intravenous Every 24 hours 06/04/13 1507 06/04/13 1615      Assessment/Plan: s/p  Continue antibiotics for treatment of severe  diverticulitis. Patient's WBC has increased and if it continue to increase and her examinations is not significantly improved, will need to go to OR soon for Hartman's procedure.  LOS: 1 day   Marta Lamas. Gae Bon, MD, FACS [phone] [facility] Surgery 06/05/2013

## 2013-06-05 NOTE — Progress Notes (Signed)
UR COMPLETED  

## 2013-06-06 LAB — BASIC METABOLIC PANEL
BUN: 4 mg/dL — ABNORMAL LOW (ref 6–23)
Chloride: 99 mEq/L (ref 96–112)
Creatinine, Ser: 0.54 mg/dL (ref 0.50–1.10)
GFR calc non Af Amer: >90 mL/min (ref >90)
Glucose, Bld: 125 mg/dL — ABNORMAL HIGH (ref 70–99)
Potassium: 3.5 mEq/L (ref 3.5–5.1)

## 2013-06-06 LAB — CBC WITH DIFFERENTIAL/PLATELET
Basophils Relative: 0 % (ref 0–1)
Eosinophils Absolute: 0.2 10*3/uL (ref 0.0–0.7)
HCT: 33.5 % — ABNORMAL LOW (ref 36.0–46.0)
Hemoglobin: 11.2 g/dL — ABNORMAL LOW (ref 12.0–15.0)
Lymphs Abs: 1.7 10*3/uL (ref 0.7–4.0)
MCH: 28.4 pg (ref 26.0–34.0)
MCHC: 33.4 g/dL (ref 30.0–36.0)
Monocytes Absolute: 0.9 10*3/uL (ref 0.1–1.0)
Monocytes Relative: 8 % (ref 3–12)
Neutrophils Relative %: 76 % (ref 43–77)
RBC: 3.95 MIL/uL (ref 3.87–5.11)

## 2013-06-06 MED ORDER — ACETAMINOPHEN 325 MG PO TABS
650.0000 mg | ORAL_TABLET | Freq: Four times a day (QID) | ORAL | Status: DC | PRN
  Administered 2013-06-06 (×2): 650 mg via ORAL
  Filled 2013-06-06 (×2): qty 2

## 2013-06-06 MED ORDER — TRAMADOL HCL 50 MG PO TABS
50.0000 mg | ORAL_TABLET | Freq: Four times a day (QID) | ORAL | Status: DC | PRN
  Administered 2013-06-06 – 2013-06-09 (×3): 50 mg via ORAL
  Filled 2013-06-06 (×3): qty 1

## 2013-06-06 NOTE — Progress Notes (Signed)
[facility]/Rachel [name] Progress Note    Subjective: Patient states that she is still having discomfort in her lower abdomen.  Objective: Vital signs in last 24 hours: Temp:  [97.6 F (36.4 C)-98.6 F (37 C)] 98.2 F (36.8 C) (06/24 0714) Pulse Rate:  [78-105] 78 (06/24 0714) Resp:  [16-19] 16 (06/24 0714) BP: (84-122)/(44-71) 122/71 mmHg (06/24 0714) SpO2:  [92 %-100 %] 95 % (06/24 0714) Last BM Date: 06/04/13  Intake/Output from previous day: 06/23 0701 - 06/24 0700 In: 3158.3 [I.V.:2558.3; IV Piggyback:600] Out: 800 [Urine:800] Intake/Output this shift:    General: Sleeping on arrival.  No acute distress at rest  Lungs: Clear  Abd: Excellent bowel sounds.  Has passed flatus.  Still very tender in the lower abdomen.  Extremities: No DVT signs or symptoms  Neuro: Intact  Lab Results:  @LABLAST2 (wbc:2,hgb:2,hct:2,plt:2) BMET  Recent Labs  06/05/13 0538 06/06/13 0525  NA 131* 133*  K 4.0 3.5  CL 97 99  CO2 26 26  GLUCOSE 111* 125*  BUN 4* 4*  CREATININE 0.59 0.54  CALCIUM 8.4 8.2*   PT/INR No results found for this basename: LABPROT, INR,  in the last 72 hours ABG No results found for this basename: PHART, PCO2, PO2, HCO3,  in the last 72 hours  Studies/Results: Ct Abdomen Pelvis W Contrast  06/04/2013   *RADIOLOGY REPORT*  Clinical Data: Lower abdominal pain and fever  CT ABDOMEN AND PELVIS WITH CONTRAST  Technique:  Multidetector CT imaging of the abdomen and pelvis was performed following the standard protocol during bolus administration of intravenous contrast.  Contrast: OMNIPAQUE IOHEXOL 300 MG/ML  SOLN  Comparison: CT abdomen 08/19/2006  Findings: Lung bases are clear.  There is no pericardial fluid. There is gas surrounding the distal esophagus at the GE junction. The gas tracks inferiorly along the left retroperitoneal space to the level of the sigmoid colon. The transverse portion of the sigmoid colon is inflamed consistent with acute diverticulitis.  There  is there is mild pericolonic stranding at t level of the transverse portion the sigmoid colon.  There are several diverticula through this region.  There is a gas tract evident on the sagittal projection along the mesenteric border (image 78).  This is consistent with a perforation of the sigmoid colon with tracking of the gas within the retroperitoneum extending to the GE junction.  The pancreas is elevated by this dissecting gas collection also which also surrounds upper kidney.  The liver, gallbladder, pancreas, spleen, adrenal glands, and kidneys are normal.  Abdominal aorta is normal in caliber.  There is no free fluid pelvis the uterus and ovaries are normal.  The bladder is normal. IUD in expected location within the uterus.  IMPRESSION:  1.  Acute sigmoid diverticulitis with perforation. 2.  Perforation recurrence along mesenteric border of the transverse sigmoid colon and dissects into the retroperitoneum to the level of the GE junction. 3.  No evidence of abscess formation.  Findings discussed with  Dr. Bernette Mayers on 06/04/2013 at 1505 hours   Original Report Authenticated By: Genevive Bi, M.D.    Anti-infectives: Anti-infectives   Start     Dose/Rate Route Frequency Ordered Stop   06/04/13 1730  ciprofloxacin (CIPRO) IVPB 400 mg     400 mg 200 mL/hr over 60 Minutes Intravenous Every 12 hours 06/04/13 1615     06/04/13 1630  metroNIDAZOLE (FLAGYL) IVPB 500 mg     500 mg 100 mL/hr over 60 Minutes Intravenous Every 8 hours 06/04/13 1615  06/04/13 1600  ertapenem (INVANZ) 1 g in sodium chloride 0.9 % 50 mL IVPB  Status:  Discontinued     1 g 100 mL/hr over 30 Minutes Intravenous Every 24 hours 06/04/13 1507 06/04/13 1615      Assessment/Plan: s/p  Continue IV antibiotics Continue to monitor pain and tenderness. If the discomfort improves soon will be able to start clear liquids.   LOS: 2 days   Marta Lamas. Gae Bon, MD, FACS [phone] [facility] Surgery 06/06/2013

## 2013-06-07 LAB — CBC
HCT: 34.3 % — ABNORMAL LOW (ref 36.0–46.0)
Hemoglobin: 11.4 g/dL — ABNORMAL LOW (ref 12.0–15.0)
MCH: 27.8 pg (ref 26.0–34.0)
MCHC: 33.2 g/dL (ref 30.0–36.0)
RDW: 12.5 % (ref 11.5–15.5)

## 2013-06-07 NOTE — Progress Notes (Signed)
  Subjective: Pt worse today, she developed abdominal pain characterized as cramping last night.  Denies nausea or vomiting.  She is having bowel movements and passing flatus.  Objective: Vital signs in last 24 hours: Temp:  [97.8 F (36.6 C)-98.8 F (37.1 C)] 97.8 F (36.6 C) (06/25 0557) Pulse Rate:  [72-80] 72 (06/25 0557) Resp:  [16-18] 18 (06/25 0557) BP: (112-125)/(54-69) 112/64 mmHg (06/25 0557) SpO2:  [95 %-99 %] 99 % (06/25 0557) Last BM Date: 06/06/13  Intake/Output from previous day: 06/24 0701 - 06/25 0700 In: 1127.1 [I.V.:1127.1] Out: -  Intake/Output this shift:    General appearance: alert, cooperative, appears older than stated age and moderate distress Resp: clear to auscultation bilaterally Cardio: regular rate and rhythm, S1, S2 normal, no murmur, click, rub or gallop GI: obese, ttp LLQ.  No masses or hernias appreciated  Lab Results:   Recent Labs  06/05/13 0538 06/06/13 0525  WBC 16.5* 11.5*  HGB 12.6 11.2*  HCT 39.2 33.5*  PLT 265 281   BMET  Recent Labs  06/05/13 0538 06/06/13 0525  NA 131* 133*  K 4.0 3.5  CL 97 99  CO2 26 26  GLUCOSE 111* 125*  BUN 4* 4*  CREATININE 0.59 0.54  CALCIUM 8.4 8.2*   Studies/Results: No results found.  Anti-infectives: Anti-infectives   Start     Dose/Rate Route Frequency Ordered Stop   06/04/13 1730  ciprofloxacin (CIPRO) IVPB 400 mg     400 mg 200 mL/hr over 60 Minutes Intravenous Every 12 hours 06/04/13 1615     06/04/13 1630  metroNIDAZOLE (FLAGYL) IVPB 500 mg     500 mg 100 mL/hr over 60 Minutes Intravenous Every 8 hours 06/04/13 1615     06/04/13 1600  ertapenem (INVANZ) 1 g in sodium chloride 0.9 % 50 mL IVPB  Status:  Discontinued     1 g 100 mL/hr over 30 Minutes Intravenous Every 24 hours 06/04/13 1507 06/04/13 1615      Assessment/Plan: Sigmoid diverticulitis(1st episode) -her pain has worsened as of last night.  She has a white count pending.  She is afebrile and  hemodynamically stable.  Will discuss with Dr. Lindie Spruce regarding repeat CT scan and changing antibiotics. -clear liquid diet, do not advance until symptoms improve   LOS: 3 days   Levan Aloia ANP-BC 06/07/2013

## 2013-06-07 NOTE — Progress Notes (Signed)
Her WBC is responding to antibiotics.  Even though she is having some more pain, she has not been pain free.  Will keep on clear liquids.  Marta Lamas. Gae Bon, MD, FACS [phone] [facility]

## 2013-06-08 ENCOUNTER — Inpatient Hospital Stay (HOSPITAL_COMMUNITY): Payer: Medicaid Other

## 2013-06-08 ENCOUNTER — Encounter (HOSPITAL_COMMUNITY): Payer: Self-pay | Admitting: Radiology

## 2013-06-08 LAB — CBC
HCT: 36.1 % (ref 36.0–46.0)
Hemoglobin: 12.1 g/dL (ref 12.0–15.0)
MCH: 28 pg (ref 26.0–34.0)
MCHC: 33.5 g/dL (ref 30.0–36.0)
MCV: 83.6 fL (ref 78.0–100.0)
Platelets: 359 10*3/uL (ref 150–400)
RBC: 4.32 MIL/uL (ref 3.87–5.11)
RDW: 12.6 % (ref 11.5–15.5)
WBC: 7.4 10*3/uL (ref 4.0–10.5)

## 2013-06-08 MED ORDER — IOHEXOL 300 MG/ML  SOLN
25.0000 mL | INTRAMUSCULAR | Status: AC
  Administered 2013-06-08: 25 mL via ORAL

## 2013-06-08 MED ORDER — IOHEXOL 300 MG/ML  SOLN
100.0000 mL | Freq: Once | INTRAMUSCULAR | Status: AC | PRN
  Administered 2013-06-08: 100 mL via INTRAVENOUS

## 2013-06-08 NOTE — Progress Notes (Signed)
Patient apparently having a lot more pain according to the PA.  [place] X-rays do not show any free air, but she does have persistent retroperitoneal air.  WBC is 7.4.  Hemoglobin stable.  Will repeat stat CT abdomen and pelvis with contrast.  Marta Lamas. Gae Bon, MD, FACS [phone] [facility]

## 2013-06-08 NOTE — Progress Notes (Signed)
Will get CT of the abdomen.

## 2013-06-08 NOTE — Progress Notes (Signed)
Patient ID: Rachel [name], female   DOB: 1981/04/17, 32 y.o.   MRN: [id number] On exam, significant tenderness LLQ and suprapubic area without generalized peritonitis.  Noted plan by Dr. Lindie Spruce for repeat CT. Violeta Gelinas, MD, MPH, FACS Pager: [phone]

## 2013-06-08 NOTE — Progress Notes (Signed)
Patient ID: Rachel [name], female   DOB: October 02, 1981, 32 y.o.   MRN: [id number]    Subjective: Pt reports severe pain and nausea, much worse, started after she had a few sips of clears, she is begging for surgery, denies vomiting, .  Objective: Vital signs in last 24 hours: Temp:  [97.5 F (36.4 C)-98.2 F (36.8 C)] 98.2 F (36.8 C) (06/26 0519) Pulse Rate:  [66-78] 78 (06/26 0519) Resp:  [17-18] 17 (06/26 0519) BP: (117-132)/(57-74) 132/74 mmHg (06/26 0519) SpO2:  [96 %-99 %] 96 % (06/26 0519) Last BM Date: 06/06/13  Intake/Output from previous day: 06/25 0701 - 06/26 0700 In: 5125 [I.V.:5125] Out: -  Intake/Output this shift:    General: appears in pain, not willing to move due to increased pain and nausea with movement Resp: clear to auscultation bilaterally Cardio: regular rate and rhythm GI: obese, extremely tender, significant guarding, distended, +BS, diffuse tenderness  Lab Results:   Recent Labs  06/07/13 1236 06/08/13 1000  WBC 7.5 7.4  HGB 11.4* 12.1  HCT 34.3* 36.1  PLT 340 359   BMET  Recent Labs  06/06/13 0525  NA 133*  K 3.5  CL 99  CO2 26  GLUCOSE 125*  BUN 4*  CREATININE 0.54  CALCIUM 8.2*   Studies/Results: Dg Abd Portable 2v  06/08/2013   *RADIOLOGY REPORT*  Clinical Data: Abdominal pain.  Nausea.  Diverticulitis.  PORTABLE ABDOMEN - 2 VIEW  Comparison: CT scan dated 06/04/2013  Findings: Retroperitoneal gas around the pancreas is noted in the left upper quadrant, essentially unchanged since the CT scan. There is diminished air in the small bowel.  There is a small amount of contrast and air in the nondistended colon.  Stomach is not distended.  No osseous abnormality.  IUD in place. No free air in the abdomen.  IMPRESSION: Persistent retroperitoneal air around the body and tail of the pancreas. Otherwise, benign-appearing abdomen.   Original Report Authenticated By: Rachel [name], M.D.    Anti-infectives: Anti-infectives   Start      Dose/Rate Route Frequency Ordered Stop   06/04/13 1730  ciprofloxacin (CIPRO) IVPB 400 mg     400 mg 200 mL/hr over 60 Minutes Intravenous Every 12 hours 06/04/13 1615     06/04/13 1630  metroNIDAZOLE (FLAGYL) IVPB 500 mg     500 mg 100 mL/hr over 60 Minutes Intravenous Every 8 hours 06/04/13 1615     06/04/13 1600  ertapenem (INVANZ) 1 g in sodium chloride 0.9 % 50 mL IVPB  Status:  Discontinued     1 g 100 mL/hr over 30 Minutes Intravenous Every 24 hours 06/04/13 1507 06/04/13 1615      Assessment/Plan: Sigmoid diverticulitis(1st episode)  --DG abd film didn't show significant changes  --Dr. Lindie [name] reviewed and given significant change in pain will order stat CT abd/pelvis with  contrast  --Make NPO  --Will follow up after CT   LOS: 4 days   Rachel [name]  06/08/2013

## 2013-06-09 NOTE — Progress Notes (Signed)
Subjective: Pt feels much better today.  Very little tenderness since her episode of vomiting yesterday.  She got a good nights sleep and only has minimal nausea with ambulation.  Urinating normally and no BM since 06/06/13.  Pt is hungry and would like to eat.  Objective: Vital signs in last 24 hours: Temp:  [98.4 F (36.9 C)-99 F (37.2 C)] 98.4 F (36.9 C) (06/27 0553) Pulse Rate:  [75-77] 77 (06/27 0553) Resp:  [16-18] 16 (06/27 0553) BP: (118-119)/(56-58) 118/58 mmHg (06/27 0553) SpO2:  [97 %] 97 % (06/27 0553) Last BM Date: 06/06/13  Intake/Output from previous day: 06/26 0701 - 06/27 0700 In: 1500 [I.V.:1500] Out: -  Intake/Output this shift:    PE: Gen:  Alert, NAD, pleasant Abd: Soft, mild tenderness, ND, +BS, no HSM, no peritoneal signs  Lab Results:   Recent Labs  06/07/13 1236 06/08/13 1000  WBC 7.5 7.4  HGB 11.4* 12.1  HCT 34.3* 36.1  PLT 340 359   BMET No results found for this basename: NA, K, CL, CO2, GLUCOSE, BUN, CREATININE, CALCIUM,  in the last 72 hours PT/INR No results found for this basename: LABPROT, INR,  in the last 72 hours CMP     Component Value Date/Time   NA 133* 06/06/2013 0525   K 3.5 06/06/2013 0525   CL 99 06/06/2013 0525   CO2 26 06/06/2013 0525   GLUCOSE 125* 06/06/2013 0525   BUN 4* 06/06/2013 0525   CREATININE 0.54 06/06/2013 0525   CALCIUM 8.2* 06/06/2013 0525   PROT 8.1 06/04/2013 1245   ALBUMIN 3.8 06/04/2013 1245   AST 14 06/04/2013 1245   ALT 22 06/04/2013 1245   ALKPHOS 84 06/04/2013 1245   BILITOT 0.9 06/04/2013 1245   GFRNONAA >90 06/06/2013 0525   GFRAA >90 06/06/2013 0525   Lipase     Component Value Date/Time   LIPASE 21 06/04/2013 1245       Studies/Results: Ct Abdomen Pelvis W Contrast  06/08/2013   *RADIOLOGY REPORT*  Clinical Data: Diverticulitis with retroperitoneal air.  Increased abdominal pain.  CT ABDOMEN AND PELVIS WITH CONTRAST  Technique:  Multidetector CT imaging of the abdomen and pelvis was  performed following the standard protocol during bolus administration of intravenous contrast.  Contrast: OMNIPAQUE IOHEXOL 300 MG/ML  SOLN  Comparison: 06/04/2013  Findings: The lung bases show interval development of small bilateral pleural effusions with associated bibasilar atelectasis.  There is significant diminishment in retroperitoneal air since the prior study with  significantly less air present.  A small amount of air remains present as high as the GE junction and continuing around the kidney and into the pelvis.  In the pelvis, inflammation in the mesenteric fat of the left lower quadrant shows slightly increased prominence without development of a marginated fluid collection or abscess.  Other than some locules of air at the level of the left lower quadrant mesenteric fat, no significant free intraperitoneal air is identified.  The liver, gallbladder, pancreas, spleen, adrenal glands and kidneys remain unremarkable.  No hernias are identified.  Bladder is within normal limits.  Stable IUD in the uterus.  IMPRESSION: Significant diminishment in retroperitoneal air since the prior study with some air remaining.  Localized air also remains present in the left lower quadrant mesenteric fat.  Inflammatory stranding in the left lower quadrant mesenteric fat is mildly more prominent compared to the prior study.  There is no evidence of development of focal abscess.   Original Report Authenticated By: Sherrine Maples  Fredia Sorrow, M.D.   Dg Abd Portable 2v  06/08/2013   *RADIOLOGY REPORT*  Clinical Data: Abdominal pain.  Nausea.  Diverticulitis.  PORTABLE ABDOMEN - 2 VIEW  Comparison: CT scan dated 06/04/2013  Findings: Retroperitoneal gas around the pancreas is noted in the left upper quadrant, essentially unchanged since the CT scan. There is diminished air in the small bowel.  There is a small amount of contrast and air in the nondistended colon.  Stomach is not distended.  No osseous abnormality.  IUD in place.  No free air in the abdomen.  IMPRESSION: Persistent retroperitoneal air around the body and tail of the pancreas. Otherwise, benign-appearing abdomen.   Original Report Authenticated By: Francene Boyers, M.D.    Anti-infectives: Anti-infectives   Start     Dose/Rate Route Frequency Ordered Stop   06/04/13 1730  ciprofloxacin (CIPRO) IVPB 400 mg     400 mg 200 mL/hr over 60 Minutes Intravenous Every 12 hours 06/04/13 1615     06/04/13 1630  metroNIDAZOLE (FLAGYL) IVPB 500 mg     500 mg 100 mL/hr over 60 Minutes Intravenous Every 8 hours 06/04/13 1615     06/04/13 1600  ertapenem (INVANZ) 1 g in sodium chloride 0.9 % 50 mL IVPB  Status:  Discontinued     1 g 100 mL/hr over 30 Minutes Intravenous Every 24 hours 06/04/13 1507 06/04/13 1615       Assessment/Plan Sigmoid diverticulitis (1st episode)  --DG abd film didn't show significant changes  --CT abd/pelvis with contrast showed some improvement from previous study, no evidence of developing abcesses --Pt with significant improvement in pain since yesterday --Restart clears in small amounts at first --Ambulate and IS --SCD's and lovenox  Leukocytosis - normalized  Disp:  Hopefully home in the next few days if pain continues to improve and we can advance diet     LOS: 5 days    Aris Georgia 06/09/2013, 7:44 AM Pager: [phone]

## 2013-06-10 LAB — BASIC METABOLIC PANEL
Calcium: 8.4 mg/dL (ref 8.4–10.5)
Creatinine, Ser: 0.66 mg/dL (ref 0.50–1.10)
GFR calc Af Amer: >90 mL/min (ref >90)
GFR calc non Af Amer: >90 mL/min (ref >90)
Sodium: 137 mEq/L (ref 135–145)

## 2013-06-10 LAB — CBC
Platelets: 404 10*3/uL — ABNORMAL HIGH (ref 150–400)
RDW: 12.6 % (ref 11.5–15.5)
WBC: 8.5 10*3/uL (ref 4.0–10.5)

## 2013-06-10 MED ORDER — METRONIDAZOLE 500 MG PO TABS
500.0000 mg | ORAL_TABLET | Freq: Three times a day (TID) | ORAL | Status: DC
  Administered 2013-06-10: 500 mg via ORAL
  Filled 2013-06-10 (×2): qty 1

## 2013-06-10 MED ORDER — CIPROFLOXACIN HCL 500 MG PO TABS: 500.0000 mg | ORAL_TABLET | Freq: Two times a day (BID) | ORAL | Status: DC

## 2013-06-10 MED ORDER — POTASSIUM CHLORIDE CRYS ER 20 MEQ PO TBCR
40.0000 meq | EXTENDED_RELEASE_TABLET | Freq: Two times a day (BID) | ORAL | Status: DC
  Administered 2013-06-10: 40 meq via ORAL
  Filled 2013-06-10: qty 2

## 2013-06-10 MED ORDER — PANTOPRAZOLE SODIUM 40 MG PO TBEC: 40.0000 mg | DELAYED_RELEASE_TABLET | Freq: Every day | ORAL | Status: DC

## 2013-06-10 MED ORDER — METRONIDAZOLE 500 MG PO TABS: 500.0000 mg | ORAL_TABLET | Freq: Three times a day (TID) | ORAL | Status: DC

## 2013-06-10 NOTE — Progress Notes (Signed)
Subjective: Stable and alert. Denies pain. Having liquid stools. Still on clear liquid diet. IV at 125 cc per hour. Campaigning to go home.  Objective: Vital signs in last 24 hours: Temp:  [98.1 F (36.7 C)-98.7 F (37.1 C)] 98.1 F (36.7 C) (06/28 0503) Pulse Rate:  [53-58] 53 (06/28 0503) Resp:  [16-19] 19 (06/28 0503) BP: (105-127)/(53-68) 106/56 mmHg (06/28 0503) SpO2:  [97 %-100 %] 100 % (06/28 0503) Last BM Date: 06/09/13  Intake/Output from previous day: 06/27 0701 - 06/28 0700 In: 6286.7 [P.O.:120; I.V.:2866.7; IV Piggyback:3300] Out: -  Intake/Output this shift:    General appearance: alert. Cooperative. Pleasant. Mental status normal. Morbidly obese. No distress. GI: abdomen soft. Localized tenderness left lower quadrant but no mass, no guarding or peritoneal signs.  Lab Results:   Recent Labs  06/08/13 1000 06/10/13 0530  WBC 7.4 8.5  HGB 12.1 12.7  HCT 36.1 37.2  PLT 359 404*   BMET  Recent Labs  06/10/13 0530  NA 137  K 3.0*  CL 101  CO2 28  GLUCOSE 130*  BUN 3*  CREATININE 0.66  CALCIUM 8.4   PT/INR No results found for this basename: LABPROT, INR,  in the last 72 hours ABG No results found for this basename: PHART, PCO2, PO2, HCO3,  in the last 72 hours  Studies/Results: Ct Abdomen Pelvis W Contrast  06/08/2013   *RADIOLOGY REPORT*  Clinical Data: Diverticulitis with retroperitoneal air.  Increased abdominal pain.  CT ABDOMEN AND PELVIS WITH CONTRAST  Technique:  Multidetector CT imaging of the abdomen and pelvis was performed following the standard protocol during bolus administration of intravenous contrast.  Contrast: OMNIPAQUE IOHEXOL 300 MG/ML  SOLN  Comparison: 06/04/2013  Findings: The lung bases show interval development of small bilateral pleural effusions with associated bibasilar atelectasis.  There is significant diminishment in retroperitoneal air since the prior study with  significantly less air present.  A small amount  of air remains present as high as the GE junction and continuing around the kidney and into the pelvis.  In the pelvis, inflammation in the mesenteric fat of the left lower quadrant shows slightly increased prominence without development of a marginated fluid collection or abscess.  Other than some locules of air at the level of the left lower quadrant mesenteric fat, no significant free intraperitoneal air is identified.  The liver, gallbladder, pancreas, spleen, adrenal glands and kidneys remain unremarkable.  No hernias are identified.  Bladder is within normal limits.  Stable IUD in the uterus.  IMPRESSION: Significant diminishment in retroperitoneal air since the prior study with some air remaining.  Localized air also remains present in the left lower quadrant mesenteric fat.  Inflammatory stranding in the left lower quadrant mesenteric fat is mildly more prominent compared to the prior study.  There is no evidence of development of focal abscess.   Original Report Authenticated By: Irish Lack, M.D.   Dg Abd Portable 2v  06/08/2013   *RADIOLOGY REPORT*  Clinical Data: Abdominal pain.  Nausea.  Diverticulitis.  PORTABLE ABDOMEN - 2 VIEW  Comparison: CT scan dated 06/04/2013  Findings: Retroperitoneal gas around the pancreas is noted in the left upper quadrant, essentially unchanged since the CT scan. There is diminished air in the small bowel.  There is a small amount of contrast and air in the nondistended colon.  Stomach is not distended.  No osseous abnormality.  IUD in place. No free air in the abdomen.  IMPRESSION: Persistent retroperitoneal air around the body  and tail of the pancreas. Otherwise, benign-appearing abdomen.   Original Report Authenticated By: Francene Boyers, M.D.    Anti-infectives: Anti-infectives   Start     Dose/Rate Route Frequency Ordered Stop   06/04/13 1730  ciprofloxacin (CIPRO) IVPB 400 mg     400 mg 200 mL/hr over 60 Minutes Intravenous Every 12 hours 06/04/13 1615      06/04/13 1630  metroNIDAZOLE (FLAGYL) IVPB 500 mg     500 mg 100 mL/hr over 60 Minutes Intravenous Every 8 hours 06/04/13 1615     06/04/13 1600  ertapenem (INVANZ) 1 g in sodium chloride 0.9 % 50 mL IVPB  Status:  Discontinued     1 g 100 mL/hr over 30 Minutes Intravenous Every 24 hours 06/04/13 1507 06/04/13 1615      Assessment/Plan:   Sigmoid diverticulitis, first episode. Microperforation with retroperitoneal air but without obstruction or abscess. Seems to be responding to medical management. Advance diet, decrease IV rate, switched to oral antibiotics Possible discharge this afternoon or tomorrow depending on progress Will need another 8 days of oral antibiotics as an outpatient Followup with Dr. Jimmye Norman as outpatient.  Morbid obesity.   LOS: 6 days    Brennah Quraishi M. Derrell Lolling, M.D., [facility] General and Minimally invasive Surgery Breast and Colorectal Surgery Office:   [phone] Pager:   [phone]  06/10/2013

## 2013-06-10 NOTE — Progress Notes (Signed)
Pt ready to go home.  Dr. Magnus Ivan called and informed.  Cipro and Flagyl Rx called into [facility] by Dr. Magnus Ivan.  Reviewed instructions for discharge and copy given to pt.  Pt given information on "Diverticulitis" and explained. Diet instructions reviewed with pt.   Pt is to follow up with Dr. Lindie Spruce and # given to pt to call for appt.  No further questions about home self care.

## 2013-06-29 NOTE — Discharge Summary (Signed)
  Physician Discharge Summary  Patient ID: Rachel [name] MRN: [id number] DOB/AGE: 02-23-1981 32 y.o.  Admit date: 06/04/2013 Discharge date: 06/29/2013  Admitting Diagnosis: Sigmoid diverticulitis   Discharge Diagnosis Sigmoid Diverticulitis  Consultants None  Procedures none  Hospital Course:  33 yr old female presented to ER with abdominal pain.  W/U showed sigmoid diverticulitis with poss microperf.  She was admitted, placed on bowel rest, IV antibiotics and serial abdominal exams.  She showed steady improvement with conservative management and her diet was advanced.  At time of discharge her symptoms were resolved, tol diet, afebrile and felt stable for discharge home.     Medication List         ciprofloxacin 500 MG tablet  Commonly known as:  CIPRO  Take 1 tablet (500 mg total) by mouth 2 (two) times daily.     citalopram 40 MG tablet  Commonly known as:  CELEXA  Take 1 tablet (40 mg total) by mouth daily.     clobetasol cream 0.05 %  Commonly known as:  TEMOVATE  Apply 1 application topically 2 (two) times daily.     lamoTRIgine 200 MG tablet  Commonly known as:  LAMICTAL  Take 400 mg by mouth 2 (two) times daily.     levETIRAcetam 500 MG 24 hr tablet  Commonly known as:  KEPPRA XR  Take 500 mg by mouth daily.     metFORMIN 500 MG tablet  Commonly known as:  GLUCOPHAGE  Take 500 mg by mouth 2 (two) times daily with a meal.     metroNIDAZOLE 500 MG tablet  Commonly known as:  FLAGYL  Take 1 tablet (500 mg total) by mouth every 8 (eight) hours.             Follow-up Information   Call [facility], Marta Lamas, MD.   Contact information:   [address] [place], [address]  [place] [address] [phone]       Signed: Clance Boll, [facility] [phone]  06/29/2013, 12:23 PM

## 2013-06-30 NOTE — Discharge Summary (Signed)
Agree with discharge summary and treatment planned as outlined.  Rachel [name]. Derrell Lolling, M.D., [facility] General and Minimally invasive Surgery Breast and Colorectal Surgery Office:   [phone] Pager:   [phone]

## 2013-07-04 ENCOUNTER — Ambulatory Visit (INDEPENDENT_AMBULATORY_CARE_PROVIDER_SITE_OTHER): Payer: Self-pay | Admitting: General Surgery

## 2013-08-08 ENCOUNTER — Other Ambulatory Visit (HOSPITAL_COMMUNITY)
Admission: RE | Admit: 2013-08-08 | Discharge: 2013-08-08 | Disposition: A | Discharge status: Home / Self Care | Payer: Medicare Other | Source: Ambulatory Visit | Attending: Family Medicine | Admitting: Family Medicine

## 2013-08-08 ENCOUNTER — Encounter: Payer: Self-pay | Admitting: Family Medicine

## 2013-08-08 ENCOUNTER — Ambulatory Visit (INDEPENDENT_AMBULATORY_CARE_PROVIDER_SITE_OTHER): Payer: Medicare Other | Admitting: Family Medicine

## 2013-08-08 VITALS — BP 120/80 | HR 104 | Resp 16 | Ht 65.0 in | Wt 274.0 lb

## 2013-08-08 DIAGNOSIS — Z124 Encounter for screening for malignant neoplasm of cervix: Secondary | ICD-10-CM | POA: Insufficient documentation

## 2013-08-08 DIAGNOSIS — Z8719 Personal history of other diseases of the digestive system: Secondary | ICD-10-CM

## 2013-08-08 DIAGNOSIS — Z1151 Encounter for screening for human papillomavirus (HPV): Secondary | ICD-10-CM

## 2013-08-08 DIAGNOSIS — B354 Tinea corporis: Secondary | ICD-10-CM

## 2013-08-08 DIAGNOSIS — Z720 Tobacco use: Secondary | ICD-10-CM

## 2013-08-08 DIAGNOSIS — F17200 Nicotine dependence, unspecified, uncomplicated: Secondary | ICD-10-CM

## 2013-08-08 DIAGNOSIS — R569 Unspecified convulsions: Secondary | ICD-10-CM | POA: Insufficient documentation

## 2013-08-08 MED ORDER — FLUCONAZOLE 100 MG PO TABS: 100.0000 mg | ORAL_TABLET | Freq: Every day | ORAL | Status: DC

## 2013-08-08 NOTE — Patient Instructions (Signed)
Preventive Care for Adults, Female A healthy lifestyle and preventive care can promote health and wellness. Preventive health guidelines for women include the following key practices.  A routine yearly physical is a good way to check with your caregiver about your health and preventive screening. It is a chance to share any concerns and updates on your health, and to receive a thorough exam.  Visit your dentist for a routine exam and preventive care every 6 months. Brush your teeth twice a day and floss once a day. Good oral hygiene prevents tooth decay and gum disease.  The frequency of eye exams is based on your age, health, family medical history, use of contact lenses, and other factors. Follow your caregiver's recommendations for frequency of eye exams.  Eat a healthy diet. Foods like vegetables, fruits, whole grains, low-fat dairy products, and lean protein foods contain the nutrients you need without too many calories. Decrease your intake of foods high in solid fats, added sugars, and salt. Eat the right amount of calories for you.Get information about a proper diet from your caregiver, if necessary.  Regular physical exercise is one of the most important things you can do for your health. Most adults should get at least 150 minutes of moderate-intensity exercise (any activity that increases your heart rate and causes you to sweat) each week. In addition, most adults need muscle-strengthening exercises on 2 or more days a week.  Maintain a healthy weight. The body mass index (BMI) is a screening tool to identify possible weight problems. It provides an estimate of body fat based on height and weight. Your caregiver can help determine your BMI, and can help you achieve or maintain a healthy weight.For adults 20 years and older:  A BMI below 18.5 is considered underweight.  A BMI of 18.5 to 24.9 is normal.  A BMI of 25 to 29.9 is considered overweight.  A BMI of 30 and above is  considered obese.  Maintain normal blood lipids and cholesterol levels by exercising and minimizing your intake of saturated fat. Eat a balanced diet with plenty of fruit and vegetables. Blood tests for lipids and cholesterol should begin at age 20 and be repeated every 5 years. If your lipid or cholesterol levels are high, you are over 50, or you are at high risk for heart disease, you may need your cholesterol levels checked more frequently.Ongoing high lipid and cholesterol levels should be treated with medicines if diet and exercise are not effective.  If you smoke, find out from your caregiver how to quit. If you do not use tobacco, do not start.  If you are pregnant, do not drink alcohol. If you are breastfeeding, be very cautious about drinking alcohol. If you are not pregnant and choose to drink alcohol, do not exceed 1 drink per day. One drink is considered to be 12 ounces (355 mL) of beer, 5 ounces (148 mL) of wine, or 1.5 ounces (44 mL) of liquor.  Avoid use of street drugs. Do not share needles with anyone. Ask for help if you need support or instructions about stopping the use of drugs.  High blood pressure causes heart disease and increases the risk of stroke. Your blood pressure should be checked at least every 1 to 2 years. Ongoing high blood pressure should be treated with medicines if weight loss and exercise are not effective.  If you are 55 to 33 years old, ask your caregiver if you should take aspirin to prevent strokes.  Diabetes   screening involves taking a blood sample to check your fasting blood sugar level. This should be done once every 3 years, after age 45, if you are within normal weight and without risk factors for diabetes. Testing should be considered at a younger age or be carried out more frequently if you are overweight and have at least 1 risk factor for diabetes.  Breast cancer screening is essential preventive care for women. You should practice "breast  self-awareness." This means understanding the normal appearance and feel of your breasts and may include breast self-examination. Any changes detected, no matter how small, should be reported to a caregiver. Women in their 20s and 30s should have a clinical breast exam (CBE) by a caregiver as part of a regular health exam every 1 to 3 years. After age 40, women should have a CBE every year. Starting at age 40, women should consider having a mammography (breast X-ray test) every year. Women who have a family history of breast cancer should talk to their caregiver about genetic screening. Women at a high risk of breast cancer should talk to their caregivers about having magnetic resonance imaging (MRI) and a mammography every year.  The Pap test is a screening test for cervical cancer. A Pap test can show cell changes on the cervix that might become cervical cancer if left untreated. A Pap test is a procedure in which cells are obtained and examined from the lower end of the uterus (cervix).  Women should have a Pap test starting at age 21.  Between ages 21 and 29, Pap tests should be repeated every 2 years.  Beginning at age 30, you should have a Pap test every 3 years as long as the past 3 Pap tests have been normal.  Some women have medical problems that increase the chance of getting cervical cancer. Talk to your caregiver about these problems. It is especially important to talk to your caregiver if a new problem develops soon after your last Pap test. In these cases, your caregiver may recommend more frequent screening and Pap tests.  The above recommendations are the same for women who have or have not gotten the vaccine for human papillomavirus (HPV).  If you had a hysterectomy for a problem that was not cancer or a condition that could lead to cancer, then you no longer need Pap tests. Even if you no longer need a Pap test, a regular exam is a good idea to make sure no other problems are  starting.  If you are between ages 65 and 70, and you have had normal Pap tests going back 10 years, you no longer need Pap tests. Even if you no longer need a Pap test, a regular exam is a good idea to make sure no other problems are starting.  If you have had past treatment for cervical cancer or a condition that could lead to cancer, you need Pap tests and screening for cancer for at least 20 years after your treatment.  If Pap tests have been discontinued, risk factors (such as a new sexual partner) need to be reassessed to determine if screening should be resumed.  The HPV test is an additional test that may be used for cervical cancer screening. The HPV test looks for the virus that can cause the cell changes on the cervix. The cells collected during the Pap test can be tested for HPV. The HPV test could be used to screen women aged 30 years and older, and should   be used in women of any age who have unclear Pap test results. After the age of 30, women should have HPV testing at the same frequency as a Pap test.  Colorectal cancer can be detected and often prevented. Most routine colorectal cancer screening begins at the age of 50 and continues through age 75. However, your caregiver may recommend screening at an earlier age if you have risk factors for colon cancer. On a yearly basis, your caregiver may provide home test kits to check for hidden blood in the stool. Use of a small camera at the end of a tube, to directly examine the colon (sigmoidoscopy or colonoscopy), can detect the earliest forms of colorectal cancer. Talk to your caregiver about this at age 50, when routine screening begins. Direct examination of the colon should be repeated every 5 to 10 years through age 75, unless early forms of pre-cancerous polyps or small growths are found.  Hepatitis C blood testing is recommended for all people born from 1945 through 1965 and any individual with known risks for hepatitis C.  Practice  safe sex. Use condoms and avoid high-risk sexual practices to reduce the spread of sexually transmitted infections (STIs). STIs include gonorrhea, chlamydia, syphilis, trichomonas, herpes, HPV, and human immunodeficiency virus (HIV). Herpes, HIV, and HPV are viral illnesses that have no cure. They can result in disability, cancer, and death. Sexually active women aged 25 and younger should be checked for chlamydia. Older women with new or multiple partners should also be tested for chlamydia. Testing for other STIs is recommended if you are sexually active and at increased risk.  Osteoporosis is a disease in which the bones lose minerals and strength with aging. This can result in serious bone fractures. The risk of osteoporosis can be identified using a bone density scan. Women ages 65 and over and women at risk for fractures or osteoporosis should discuss screening with their caregivers. Ask your caregiver whether you should take a calcium supplement or vitamin D to reduce the rate of osteoporosis.  Menopause can be associated with physical symptoms and risks. Hormone replacement therapy is available to decrease symptoms and risks. You should talk to your caregiver about whether hormone replacement therapy is right for you.  Use sunscreen with sun protection factor (SPF) of 30 or more. Apply sunscreen liberally and repeatedly throughout the day. You should seek shade when your shadow is shorter than you. Protect yourself by wearing long sleeves, pants, a wide-brimmed hat, and sunglasses year round, whenever you are outdoors.  Once a month, do a whole body skin exam, using a mirror to look at the skin on your back. Notify your caregiver of new moles, moles that have irregular borders, moles that are larger than a pencil eraser, or moles that have changed in shape or color.  Stay current with required immunizations.  Influenza. You need a dose every fall (or winter). The composition of the flu vaccine  changes each year, so being vaccinated once is not enough.  Pneumococcal polysaccharide. You need 1 to 2 doses if you smoke cigarettes or if you have certain chronic medical conditions. You need 1 dose at age 65 (or older) if you have never been vaccinated.  Tetanus, diphtheria, pertussis (Tdap, Td). Get 1 dose of Tdap vaccine if you are younger than age 65, are over 65 and have contact with an infant, are a healthcare worker, are pregnant, or simply want to be protected from whooping cough. After that, you need a Td   booster dose every 10 years. Consult your caregiver if you have not had at least 3 tetanus and diphtheria-containing shots sometime in your life or have a deep or dirty wound.  HPV. You need this vaccine if you are a woman age 26 or younger. The vaccine is given in 3 doses over 6 months.  Measles, mumps, rubella (MMR). You need at least 1 dose of MMR if you were born in 1957 or later. You may also need a second dose.  Meningococcal. If you are age 19 to 21 and a first-year college student living in a residence hall, or have one of several medical conditions, you need to get vaccinated against meningococcal disease. You may also need additional booster doses.  Zoster (shingles). If you are age 60 or older, you should get this vaccine.  Varicella (chickenpox). If you have never had chickenpox or you were vaccinated but received only 1 dose, talk to your caregiver to find out if you need this vaccine.  Hepatitis A. You need this vaccine if you have a specific risk factor for hepatitis A virus infection or you simply wish to be protected from this disease. The vaccine is usually given as 2 doses, 6 to 18 months apart.  Hepatitis B. You need this vaccine if you have a specific risk factor for hepatitis B virus infection or you simply wish to be protected from this disease. The vaccine is given in 3 doses, usually over 6 months. Preventive Services / Frequency Ages 19 to 39  Blood  pressure check.** / Every 1 to 2 years.  Lipid and cholesterol check.** / Every 5 years beginning at age 20.  Clinical breast exam.** / Every 3 years for women in their 20s and 30s.  Pap test.** / Every 2 years from ages 21 through 29. Every 3 years starting at age 30 through age 65 or 70 with a history of 3 consecutive normal Pap tests.  HPV screening.** / Every 3 years from ages 30 through ages 65 to 70 with a history of 3 consecutive normal Pap tests.  Hepatitis C blood test.** / For any individual with known risks for hepatitis C.  Skin self-exam. / Monthly.  Influenza immunization.** / Every year.  Pneumococcal polysaccharide immunization.** / 1 to 2 doses if you smoke cigarettes or if you have certain chronic medical conditions.  Tetanus, diphtheria, pertussis (Tdap, Td) immunization. / A one-time dose of Tdap vaccine. After that, you need a Td booster dose every 10 years.  HPV immunization. / 3 doses over 6 months, if you are 26 and younger.  Measles, mumps, rubella (MMR) immunization. / You need at least 1 dose of MMR if you were born in 1957 or later. You may also need a second dose.  Meningococcal immunization. / 1 dose if you are age 19 to 21 and a first-year college student living in a residence hall, or have one of several medical conditions, you need to get vaccinated against meningococcal disease. You may also need additional booster doses.  Varicella immunization.** / Consult your caregiver.  Hepatitis A immunization.** / Consult your caregiver. 2 doses, 6 to 18 months apart.  Hepatitis B immunization.** / Consult your caregiver. 3 doses usually over 6 months. Ages 40 to 64  Blood pressure check.** / Every 1 to 2 years.  Lipid and cholesterol check.** / Every 5 years beginning at age 20.  Clinical breast exam.** / Every year after age 40.  Mammogram.** / Every year beginning at age 40   and continuing for as long as you are in good health. Consult with your  caregiver.  Pap test.** / Every 3 years starting at age 30 through age 65 or 70 with a history of 3 consecutive normal Pap tests.  HPV screening.** / Every 3 years from ages 30 through ages 65 to 70 with a history of 3 consecutive normal Pap tests.  Fecal occult blood test (FOBT) of stool. / Every year beginning at age 50 and continuing until age 75. You may not need to do this test if you get a colonoscopy every 10 years.  Flexible sigmoidoscopy or colonoscopy.** / Every 5 years for a flexible sigmoidoscopy or every 10 years for a colonoscopy beginning at age 50 and continuing until age 75.  Hepatitis C blood test.** / For all people born from 1945 through 1965 and any individual with known risks for hepatitis C.  Skin self-exam. / Monthly.  Influenza immunization.** / Every year.  Pneumococcal polysaccharide immunization.** / 1 to 2 doses if you smoke cigarettes or if you have certain chronic medical conditions.  Tetanus, diphtheria, pertussis (Tdap, Td) immunization.** / A one-time dose of Tdap vaccine. After that, you need a Td booster dose every 10 years.  Measles, mumps, rubella (MMR) immunization. / You need at least 1 dose of MMR if you were born in 1957 or later. You may also need a second dose.  Varicella immunization.** / Consult your caregiver.  Meningococcal immunization.** / Consult your caregiver.  Hepatitis A immunization.** / Consult your caregiver. 2 doses, 6 to 18 months apart.  Hepatitis B immunization.** / Consult your caregiver. 3 doses, usually over 6 months. Ages 65 and over  Blood pressure check.** / Every 1 to 2 years.  Lipid and cholesterol check.** / Every 5 years beginning at age 20.  Clinical breast exam.** / Every year after age 40.  Mammogram.** / Every year beginning at age 40 and continuing for as long as you are in good health. Consult with your caregiver.  Pap test.** / Every 3 years starting at age 30 through age 65 or 70 with a 3  consecutive normal Pap tests. Testing can be stopped between 65 and 70 with 3 consecutive normal Pap tests and no abnormal Pap or HPV tests in the past 10 years.  HPV screening.** / Every 3 years from ages 30 through ages 65 or 70 with a history of 3 consecutive normal Pap tests. Testing can be stopped between 65 and 70 with 3 consecutive normal Pap tests and no abnormal Pap or HPV tests in the past 10 years.  Fecal occult blood test (FOBT) of stool. / Every year beginning at age 50 and continuing until age 75. You may not need to do this test if you get a colonoscopy every 10 years.  Flexible sigmoidoscopy or colonoscopy.** / Every 5 years for a flexible sigmoidoscopy or every 10 years for a colonoscopy beginning at age 50 and continuing until age 75.  Hepatitis C blood test.** / For all people born from 1945 through 1965 and any individual with known risks for hepatitis C.  Osteoporosis screening.** / A one-time screening for women ages 65 and over and women at risk for fractures or osteoporosis.  Skin self-exam. / Monthly.  Influenza immunization.** / Every year.  Pneumococcal polysaccharide immunization.** / 1 dose at age 65 (or older) if you have never been vaccinated.  Tetanus, diphtheria, pertussis (Tdap, Td) immunization. / A one-time dose of Tdap vaccine if you are over   65 and have contact with an infant, are a Research scientist (physical sciences), or simply want to be protected from whooping cough. After that, you need a Td booster dose every 10 years.  Varicella immunization.** / Consult your caregiver.  Meningococcal immunization.** / Consult your caregiver.  Hepatitis A immunization.** / Consult your caregiver. 2 doses, 6 to 18 months apart.  Hepatitis B immunization.** / Check with your caregiver. 3 doses, usually over 6 months. ** Family history and personal history of risk and conditions may change your caregiver's recommendations. Document Released: 01/26/2002 Document Revised: 02/22/2012  Document Reviewed: 04/27/2011 [facility] Patient Information 2014 [place], [place]. Diverticulitis A diverticulum is a small pouch or sac on the colon. Diverticulosis is the presence of these diverticula on the colon. Diverticulitis is the irritation (inflammation) or infection of diverticula. CAUSES  The colon and its diverticula contain bacteria. If food particles block the tiny opening to a diverticulum, the bacteria inside can grow and cause an increase in pressure. This leads to infection and inflammation and is called diverticulitis. SYMPTOMS   Abdominal pain and tenderness. Usually, the pain is located on the left side of your abdomen. However, it could be located elsewhere.  Fever.  Bloating.  Feeling sick to your stomach (nausea).  Throwing up (vomiting).  Abnormal stools. DIAGNOSIS  Your caregiver will take a history and perform a physical exam. Since many things can cause abdominal pain, other tests may be necessary. Tests may include:  Blood tests.  Urine tests.  X-ray of the abdomen.  CT scan of the abdomen. Sometimes, surgery is needed to determine if diverticulitis or other conditions are causing your symptoms. TREATMENT  Most of the time, you can be treated without surgery. Treatment includes:  Resting the bowels by only having liquids for a few days. As you improve, you will need to eat a low-fiber diet.  Intravenous (IV) fluids if you are losing body fluids (dehydrated).  Antibiotic medicines that treat infections may be given.  Pain and nausea medicine, if needed.  Surgery if the inflamed diverticulum has burst. HOME CARE INSTRUCTIONS   Try a clear liquid diet (broth, tea, or water for as long as directed by your caregiver). You may then gradually begin a low-fiber diet as tolerated.  A low-fiber diet is a diet with less than 10 grams of fiber. Choose the foods below to reduce fiber in the diet:  White breads, cereals, rice, and pasta.  Cooked fruits  and vegetables or soft fresh fruits and vegetables without the skin.  Ground or well-cooked tender beef, ham, veal, lamb, pork, or poultry.  Eggs and seafood.  After your diverticulitis symptoms have improved, your caregiver may put you on a high-fiber diet. A high-fiber diet includes 14 grams of fiber for every 1000 calories consumed. For a standard 2000 calorie diet, you would need 28 grams of fiber. Follow these diet guidelines to help you increase the fiber in your diet. It is important to slowly increase the amount fiber in your diet to avoid gas, constipation, and bloating.  Choose whole-grain breads, cereals, pasta, and brown rice.  Choose fresh fruits and vegetables with the skin on. Do not overcook vegetables because the more vegetables are cooked, the more fiber is lost.  Choose more nuts, seeds, legumes, dried peas, beans, and lentils.  Look for food products that have greater than 3 grams of fiber per serving on the Nutrition Facts label.  Take all medicine as directed by your caregiver.  If your caregiver has given you  a follow-up appointment, it is very important that you go. Not going could result in lasting (chronic) or permanent injury, pain, and disability. If there is any problem keeping the appointment, call to reschedule. SEEK MEDICAL CARE IF:   Your pain does not improve.  You have a hard time advancing your diet beyond clear liquids.  Your bowel movements do not return to normal. SEEK IMMEDIATE MEDICAL CARE IF:   Your pain becomes worse.  You have an oral temperature above 102 F (38.9 C), not controlled by medicine.  You have repeated vomiting.  You have bloody or black, tarry stools.  Symptoms that brought you to your caregiver become worse or are not getting better. MAKE SURE YOU:   Understand these instructions.  Will watch your condition.  Will get help right away if you are not doing well or get worse. Document Released: 09/09/2005 Document  Revised: 02/22/2012 Document Reviewed: 01/05/2011 [facility] Patient Information 2014 [place], [place]. Body Ringworm Ringworm (tinea corporis) is a fungal infection of the skin on the body. This infection is not caused by worms, but is actually caused by a fungus. Fungus normally lives on the top of your skin and can be useful. However, in the case of ringworms, the fungus grows out of control and causes a skin infection. It can involve any area of skin on the body and can spread easily from one person to another (contagious). Ringworm is a common problem for children, but it can affect adults as well. Ringworm is also often found in athletes, especially wrestlers who share equipment and mats.  CAUSES  Ringworm of the body is caused by a fungus called dermatophyte. It can spread by:  Touchingother people who are infected.  Touchinginfected pets.  Touching or sharingobjects that have been in contact with the infected person or pet (hats, combs, towels, clothing, sports equipment). SYMPTOMS   Itchy, raised red spots and bumps on the skin.  Ring-shaped rash.  Redness near the border of the rash with a clear center.  Dry and scaly skin on or around the rash. Not every person develops a ring-shaped rash. Some develop only the red, scaly patches. DIAGNOSIS  Most often, ringworm can be diagnosed by performing a skin exam. Your caregiver may choose to take a skin scraping from the affected area. The sample will be examined under the microscope to see if the fungus is present.  TREATMENT  Body ringworm may be treated with a topical antifungal cream or ointment. Sometimes, an antifungal shampoo that can be used on your body is prescribed. You may be prescribed antifungal medicines to take by mouth if your ringworm is severe, keeps coming back, or lasts a long time.  HOME CARE INSTRUCTIONS   Only take over-the-counter or prescription medicines as directed by your caregiver.  Wash the infected  area and dry it completely before applying yourcream or ointment.  When using antifungal shampoo to treat the ringworm, leave the shampoo on the body for 3 5 minutes before rinsing.   Wear loose clothing to stop clothes from rubbing and irritating the rash.  Wash or change your bed sheets every night while you have the rash.  Have your pet treated by your veterinarian if it has the same infection. To prevent ringworm:   Practice good hygiene.  Wear sandals or shoes in public places and showers.  Do not share personal items with others.  Avoid touching red patches of skin on other people.  Avoid touching pets that have bald  spots or wash your hands after doing so. SEEK MEDICAL CARE IF:   Your rash continues to spread after 7 days of treatment.  Your rash is not gone in 4 weeks.  The area around your rash becomes red, warm, tender, and swollen. Document Released: 11/27/2000 Document Revised: 08/24/2012 Document Reviewed: 06/13/2012 [facility] Patient Information 2014 [place], [place].

## 2013-08-08 NOTE — Assessment & Plan Note (Signed)
Trial of Diflucan.

## 2013-08-08 NOTE — Progress Notes (Signed)
  Subjective:    Patient ID: Rachel [name], female    DOB: Aug 20, 1981, 32 y.o.   MRN: [id number]  Gynecologic Exam Associated symptoms include rash. Pertinent negatives include no abdominal pain, back pain, chills, dysuria or fever.    Here for much needed pap.  Recent hospitalization for diverticulitis.  This has improved.  Needs po medication for tinea, topical is not working.  Has skin breakdown related to this.  Notes IUD is in place.  Due to be changed next year.  No cycles.  The following portions of the patient's history were reviewed and updated as appropriate: allergies, current medications, past family history, past medical history, past social history, past surgical history and problem list.  Review of Systems  Constitutional: Negative for fever and chills.  Respiratory: Negative for chest tightness and shortness of breath.   Cardiovascular: Negative for chest pain.  Gastrointestinal: Negative for abdominal pain.  Genitourinary: Negative for dysuria.  Musculoskeletal: Negative for back pain.  Skin: Positive for rash.       Objective:   Physical Exam  Vitals reviewed. Constitutional: She is oriented to person, place, and time. She appears well-developed and well-nourished. No distress.  HENT:  Head: Normocephalic and atraumatic.  Eyes: No scleral icterus.  Neck: Neck supple. No thyromegaly present.  Cardiovascular: Normal rate and regular rhythm.   No murmur heard. Pulmonary/Chest: Effort normal and breath sounds normal.  Abdominal: Soft. There is no tenderness.  Genitourinary: Vagina normal and uterus normal. There is no lesion on the right labia. There is no lesion on the left labia. Uterus is not enlarged and not tender. Cervix exhibits no motion tenderness and no discharge. Right adnexum displays no mass and no tenderness. Left adnexum displays no mass and no tenderness.  Musculoskeletal: Normal range of motion. She exhibits no edema.  Neurological: She is alert and  oriented to person, place, and time.  Skin: Skin is warm and dry. Rash (around vulva and lower abdomen and upper thighs.) noted. Rash is papular. There is erythema.  Psychiatric: She has a normal mood and affect. Her behavior is normal. Thought content normal.          Assessment & Plan:

## 2013-08-08 NOTE — Assessment & Plan Note (Signed)
Pap smear today. 

## 2013-08-16 ENCOUNTER — Telehealth: Payer: Self-pay | Admitting: *Deleted

## 2013-08-16 DIAGNOSIS — B354 Tinea corporis: Secondary | ICD-10-CM

## 2013-08-16 MED ORDER — FLUCONAZOLE 100 MG PO TABS: 100.0000 mg | ORAL_TABLET | Freq: Every day | ORAL | Status: DC

## 2013-08-16 NOTE — Telephone Encounter (Signed)
Patient requests refill of her diflucan.

## 2013-09-13 ENCOUNTER — Ambulatory Visit (INDEPENDENT_AMBULATORY_CARE_PROVIDER_SITE_OTHER): Payer: Medicare Other | Admitting: *Deleted

## 2013-09-13 DIAGNOSIS — Z23 Encounter for immunization: Secondary | ICD-10-CM

## 2013-10-03 ENCOUNTER — Telehealth (INDEPENDENT_AMBULATORY_CARE_PROVIDER_SITE_OTHER): Payer: Self-pay | Admitting: *Deleted

## 2013-10-03 NOTE — Telephone Encounter (Signed)
Patient called to report that she feels she is having another diverticular flare up.  Patient states this is the exact same abdominal pain as she had when she was admitted to the hospital the last time.  Patient states she did not come for a follow up appt due to the front desk told her that because she didn't have surgery she wouldn't need a PO visit.  Explained to patient that I would send a message to Dr. Lindie Spruce who she was suppose to follow up with per discharge instructions to find out what his suggestion would be for patient to do but also explained that it will be tomorrow before I have a response.  Instructed patient that if things get worse she would need to go to the ED for assessment.  Patient states understanding and agreeable at this time.

## 2013-10-04 ENCOUNTER — Telehealth (INDEPENDENT_AMBULATORY_CARE_PROVIDER_SITE_OTHER): Payer: Self-pay | Admitting: *Deleted

## 2013-10-04 NOTE — Telephone Encounter (Signed)
Patient updated with below message.  Patient states understanding and agreeable at this time.

## 2013-10-04 NOTE — Telephone Encounter (Signed)
Message copied by Consuelo Pandy on Wed Oct 04, 2013  8:38 AM ------      Message from: Frederik Schmidt      Created: Tue Oct 03, 2013  9:33 PM      Regarding: follow up       This patient was a [facility] admitted patient with nonoperative diverticulitis, and should have been followed in clinic, either the [facility] clinic or mine.  She needs to go back to the ED if she is having similar symptoms. ------

## 2013-10-06 ENCOUNTER — Inpatient Hospital Stay (HOSPITAL_COMMUNITY)
Admission: EM | Admit: 2013-10-06 | Discharge: 2013-10-12 | DRG: 392 | Disposition: A | Discharge status: Home / Self Care | Payer: Medicare Other | Attending: General Surgery | Admitting: General Surgery

## 2013-10-06 ENCOUNTER — Emergency Department (HOSPITAL_COMMUNITY): Payer: Medicare Other

## 2013-10-06 ENCOUNTER — Encounter (HOSPITAL_COMMUNITY): Payer: Self-pay | Admitting: Emergency Medicine

## 2013-10-06 DIAGNOSIS — Z6841 Body Mass Index (BMI) 40.0 and over, adult: Secondary | ICD-10-CM

## 2013-10-06 DIAGNOSIS — R109 Unspecified abdominal pain: Secondary | ICD-10-CM

## 2013-10-06 DIAGNOSIS — F17200 Nicotine dependence, unspecified, uncomplicated: Secondary | ICD-10-CM | POA: Diagnosis present

## 2013-10-06 DIAGNOSIS — F3289 Other specified depressive episodes: Secondary | ICD-10-CM | POA: Diagnosis present

## 2013-10-06 DIAGNOSIS — G40309 Generalized idiopathic epilepsy and epileptic syndromes, not intractable, without status epilepticus: Secondary | ICD-10-CM | POA: Diagnosis present

## 2013-10-06 DIAGNOSIS — K5732 Diverticulitis of large intestine without perforation or abscess without bleeding: Principal | ICD-10-CM | POA: Diagnosis present

## 2013-10-06 DIAGNOSIS — F329 Major depressive disorder, single episode, unspecified: Secondary | ICD-10-CM | POA: Diagnosis present

## 2013-10-06 DIAGNOSIS — K5792 Diverticulitis of intestine, part unspecified, without perforation or abscess without bleeding: Secondary | ICD-10-CM

## 2013-10-06 DIAGNOSIS — R21 Rash and other nonspecific skin eruption: Secondary | ICD-10-CM | POA: Diagnosis not present

## 2013-10-06 LAB — PREGNANCY, URINE: Preg Test, Ur: NEGATIVE

## 2013-10-06 LAB — URINALYSIS, ROUTINE W REFLEX MICROSCOPIC
Glucose, UA: NEGATIVE mg/dL
Leukocytes, UA: NEGATIVE
Protein, ur: NEGATIVE mg/dL
Specific Gravity, Urine: 1.021 (ref 1.005–1.030)
pH: 6 (ref 5.0–8.0)

## 2013-10-06 LAB — URINE MICROSCOPIC-ADD ON

## 2013-10-06 LAB — CBC WITH DIFFERENTIAL/PLATELET
Basophils Absolute: 0 10*3/uL (ref 0.0–0.1)
Basophils Relative: 0 % (ref 0–1)
Eosinophils Relative: 2 % (ref 0–5)
Lymphocytes Relative: 25 % (ref 12–46)
MCHC: 34.3 g/dL (ref 30.0–36.0)
Neutro Abs: 7.3 10*3/uL (ref 1.7–7.7)
Platelets: 324 10*3/uL (ref 150–400)
RDW: 13.3 % (ref 11.5–15.5)
WBC: 11.4 10*3/uL — ABNORMAL HIGH (ref 4.0–10.5)

## 2013-10-06 LAB — COMPREHENSIVE METABOLIC PANEL
ALT: 41 U/L — ABNORMAL HIGH (ref 0–35)
AST: 25 U/L (ref 0–37)
Albumin: 3.4 g/dL — ABNORMAL LOW (ref 3.5–5.2)
CO2: 25 mEq/L (ref 19–32)
Calcium: 9.4 mg/dL (ref 8.4–10.5)
Chloride: 102 mEq/L (ref 96–112)
GFR calc non Af Amer: >90 mL/min (ref >90)
Sodium: 137 mEq/L (ref 135–145)

## 2013-10-06 MED ORDER — HYDROMORPHONE HCL PF 1 MG/ML IJ SOLN
1.0000 mg | Freq: Once | INTRAMUSCULAR | Status: AC
  Administered 2013-10-06: 1 mg via INTRAVENOUS
  Filled 2013-10-06: qty 1

## 2013-10-06 MED ORDER — SODIUM CHLORIDE 0.9 % IV BOLUS (SEPSIS)
1000.0000 mL | Freq: Once | INTRAVENOUS | Status: AC
  Administered 2013-10-06: 1000 mL via INTRAVENOUS

## 2013-10-06 MED ORDER — IOHEXOL 300 MG/ML  SOLN
100.0000 mL | Freq: Once | INTRAMUSCULAR | Status: AC | PRN
  Administered 2013-10-06: 100 mL via INTRAVENOUS

## 2013-10-06 MED ORDER — IOHEXOL 300 MG/ML  SOLN
25.0000 mL | Freq: Once | INTRAMUSCULAR | Status: AC | PRN
  Administered 2013-10-06: 25 mL via ORAL

## 2013-10-06 MED ORDER — PIPERACILLIN-TAZOBACTAM 3.375 G IVPB
3.3750 g | Freq: Once | INTRAVENOUS | Status: AC
  Administered 2013-10-06: 3.375 g via INTRAVENOUS
  Filled 2013-10-06: qty 50

## 2013-10-06 MED ORDER — ONDANSETRON HCL 4 MG/2ML IJ SOLN
4.0000 mg | Freq: Once | INTRAMUSCULAR | Status: AC
  Administered 2013-10-06: 4 mg via INTRAVENOUS
  Filled 2013-10-06: qty 2

## 2013-10-06 MED ORDER — ONDANSETRON 8 MG PO TBDP: 8.0000 mg | ORAL_TABLET | Freq: Once | ORAL | Status: DC

## 2013-10-06 NOTE — ED Notes (Signed)
Diverticulitis flare up since Tuesday. Called Dr. Lindie Spruce.  Left lower abd. Pain. Diarrhea last several days. No bm today.

## 2013-10-06 NOTE — ED Notes (Signed)
Pt states she is having another bought of antibiotics. States she was in the hospital for 7 days before and it was fixed w antibiotics.

## 2013-10-06 NOTE — ED Provider Notes (Signed)
CSN: [id number]     Arrival date & time 10/06/13  2012 History   First MD Initiated Contact with Patient 10/06/13 2023     Chief Complaint  Patient presents with  . Diverticulitis   (Consider location/radiation/quality/duration/timing/severity/associated sxs/prior Treatment) HPI Comments: 32 yo female with diverticulitis, smoking hx presents with LLQ pain since Tue, gradually worsening, nothing improves, severe sharp/ ache.  No abd surgery hx but pt was admitted for one week for similar in the past.  Morphine allergy.  No current abx.  The history is provided by the patient.    Past Medical History  Diagnosis Date  . Migraines     "haven't had one since I was a kid" (06/05/2013)  . 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 Depression   . Epilepsy   . Diverticulosis    Past Surgical History  Procedure Laterality Date  . Carpal tunnel release Left 2011?   No family history on file. History  Substance Use Topics  . Smoking status: Current Every Day Smoker -- 1.00 packs/day for 18 years    Types: Cigarettes  . Smokeless tobacco: Never Used  . Alcohol Use: No   OB History   Grav Para Term Preterm Abortions TAB SAB Ect Mult Living   4 1   3  3   1      Review of Systems  Constitutional: Positive for appetite change. Negative for fever and chills.  Eyes: Negative for visual disturbance.  Respiratory: Negative for shortness of breath.   Cardiovascular: Negative for chest pain.  Gastrointestinal: Positive for abdominal pain. Negative for vomiting.  Genitourinary: Negative for dysuria and flank pain.  Musculoskeletal: Negative for back pain, neck pain and neck stiffness.  Skin: Negative for rash.  Neurological: Negative for light-headedness and headaches.    Allergies  Morphine and related and Phenergan  Home Medications   Current Outpatient Rx  Name  Route  Sig  Dispense  Refill  . citalopram (CELEXA) 40 MG  tablet   Oral   Take 1 tablet (40 mg total) by mouth daily.   30 tablet   2   . clobetasol cream (TEMOVATE) 0.05 %   Topical   Apply 1 application topically 2 (two) times daily.   30 g   6   . fluconazole (DIFLUCAN) 100 MG tablet   Oral   Take 1 tablet (100 mg total) by mouth daily. Take 2 on first day   11 tablet   2   . lamoTRIgine (LAMICTAL) 200 MG tablet   Oral   Take 400 mg by mouth 2 (two) times daily.         Marland Kitchen levETIRAcetam (KEPPRA XR) 500 MG 24 hr tablet   Oral   Take 500 mg by mouth daily.         . metFORMIN (GLUCOPHAGE) 500 MG tablet   Oral   Take 500 mg by mouth 2 (two) times daily with a meal.          BP 121/69  Pulse 111  Temp(Src) 98.9 F (37.2 C) (Oral)  Resp 20  SpO2 97% Physical Exam  Nursing note and vitals reviewed. Constitutional: She is oriented to person, place, and time. She appears well-developed and well-nourished.  HENT:  Head: Normocephalic and atraumatic.  Dry mm  Eyes: Conjunctivae are normal. Right eye exhibits no discharge. Left eye exhibits no discharge.  Neck: Normal range of motion. Neck supple.  No tracheal deviation present.  Cardiovascular: Regular rhythm.  Tachycardia present.   Pulmonary/Chest: Effort normal and breath sounds normal.  Abdominal: Soft. She exhibits no distension. There is tenderness (LLQ moderate). There is no guarding.  Musculoskeletal: She exhibits no edema.  Neurological: She is alert and oriented to person, place, and time.  Skin: Skin is warm. No rash noted.  Psychiatric: She has a normal mood and affect.    ED Course  Procedures (including critical care time) Labs Review Labs Reviewed  CBC WITH DIFFERENTIAL - Abnormal; Notable for the following:    WBC 11.4 (*)    All other components within normal limits  COMPREHENSIVE METABOLIC PANEL - Abnormal; Notable for the following:    Glucose, Bld 125 (*)    Albumin 3.4 (*)    ALT 41 (*)    Total Bilirubin 0.2 (*)    All other components  within normal limits  URINALYSIS, ROUTINE W REFLEX MICROSCOPIC - Abnormal; Notable for the following:    Hgb urine dipstick SMALL (*)    All other components within normal limits  URINE MICROSCOPIC-ADD ON - Abnormal; Notable for the following:    Casts HYALINE CASTS (*)    All other components within normal limits  LIPASE, BLOOD  PREGNANCY, URINE  LACTIC ACID, PLASMA   Imaging Review No results found.  EKG Interpretation   None       MDM  No diagnosis found. Concern for diverticulitis, with severity of pain and hx plan for CT to rule out abscess. Pain meds and fluids. Rechecked, pain persistent, dilaudid ordered.   Discussed with Dr Onalee Hua for admission, surgery consult - Dr Carolynne Edouard will see patient and likely take primarily.   BP dropped to high 80s.  Second IV and 2nd L bolus ordered.  Lactate added.  Zosyn given.    Signed out for final dispo for admission pending surgery eval in ED and close monitoring of bp.  The patients results and plan were reviewed and discussed.   Any x-rays performed were personally reviewed by myself.   Differential diagnosis were considered with the presenting HPI.    Admission/ observation were discussed with the admitting physician, patient and/or family and they are comfortable with the plan.  Diagnosis: Severe sepsis, Diverticulitis with abscess/ perforation, Abd pain      Enid Skeens, MD 10/07/13 0000

## 2013-10-07 ENCOUNTER — Encounter (HOSPITAL_COMMUNITY): Payer: Self-pay | Admitting: *Deleted

## 2013-10-07 DIAGNOSIS — K5732 Diverticulitis of large intestine without perforation or abscess without bleeding: Secondary | ICD-10-CM

## 2013-10-07 LAB — CBC
MCH: 28.6 pg (ref 26.0–34.0)
MCHC: 33 g/dL (ref 30.0–36.0)
Platelets: 281 10*3/uL (ref 150–400)
RBC: 4.4 MIL/uL (ref 3.87–5.11)
WBC: 9 10*3/uL (ref 4.0–10.5)

## 2013-10-07 LAB — BASIC METABOLIC PANEL
Calcium: 8 mg/dL — ABNORMAL LOW (ref 8.4–10.5)
Chloride: 103 mEq/L (ref 96–112)
GFR calc Af Amer: >90 mL/min (ref >90)
GFR calc non Af Amer: >90 mL/min (ref >90)
Sodium: 136 mEq/L (ref 135–145)

## 2013-10-07 LAB — MRSA PCR SCREENING: MRSA by PCR: NEGATIVE

## 2013-10-07 LAB — LACTIC ACID, PLASMA: Lactic Acid, Venous: 1.1 mmol/L (ref 0.5–2.2)

## 2013-10-07 MED ORDER — CHLORHEXIDINE GLUCONATE 0.12 % MT SOLN
15.0000 mL | Freq: Two times a day (BID) | OROMUCOSAL | Status: DC
  Administered 2013-10-07 – 2013-10-10 (×7): 15 mL via OROMUCOSAL
  Filled 2013-10-07 (×5): qty 15

## 2013-10-07 MED ORDER — HYDROMORPHONE HCL PF 1 MG/ML IJ SOLN
0.5000 mg | INTRAMUSCULAR | Status: DC | PRN
  Administered 2013-10-07 – 2013-10-12 (×23): 0.5 mg via INTRAVENOUS
  Filled 2013-10-07 (×23): qty 1

## 2013-10-07 MED ORDER — ENOXAPARIN SODIUM 40 MG/0.4ML ~~LOC~~ SOLN
40.0000 mg | SUBCUTANEOUS | Status: DC
  Administered 2013-10-07 – 2013-10-12 (×6): 40 mg via SUBCUTANEOUS
  Filled 2013-10-07 (×7): qty 0.4

## 2013-10-07 MED ORDER — LEVETIRACETAM ER 500 MG PO TB24
500.0000 mg | ORAL_TABLET | Freq: Every day | ORAL | Status: DC
  Administered 2013-10-07 – 2013-10-12 (×6): 500 mg via ORAL
  Filled 2013-10-07 (×7): qty 1

## 2013-10-07 MED ORDER — CITALOPRAM HYDROBROMIDE 40 MG PO TABS
40.0000 mg | ORAL_TABLET | Freq: Every day | ORAL | Status: DC
  Administered 2013-10-07 – 2013-10-11 (×5): 40 mg via ORAL
  Filled 2013-10-07 (×6): qty 1

## 2013-10-07 MED ORDER — KCL IN DEXTROSE-NACL 20-5-0.9 MEQ/L-%-% IV SOLN
INTRAVENOUS | Status: DC
  Administered 2013-10-07: 12:00:00 via INTRAVENOUS
  Administered 2013-10-07: 100 mL/h via INTRAVENOUS
  Administered 2013-10-07 – 2013-10-12 (×10): via INTRAVENOUS
  Filled 2013-10-07 (×17): qty 1000

## 2013-10-07 MED ORDER — LAMOTRIGINE 200 MG PO TABS
200.0000 mg | ORAL_TABLET | Freq: Four times a day (QID) | ORAL | Status: DC
  Administered 2013-10-07 – 2013-10-12 (×22): 200 mg via ORAL
  Filled 2013-10-07 (×25): qty 1

## 2013-10-07 MED ORDER — FENTANYL CITRATE 0.05 MG/ML IJ SOLN
50.0000 ug | INTRAMUSCULAR | Status: DC | PRN
  Administered 2013-10-07 (×5): 50 ug via INTRAVENOUS
  Filled 2013-10-07 (×5): qty 2

## 2013-10-07 MED ORDER — PIPERACILLIN-TAZOBACTAM 3.375 G IVPB
3.3750 g | Freq: Three times a day (TID) | INTRAVENOUS | Status: DC
  Administered 2013-10-07 – 2013-10-12 (×16): 3.375 g via INTRAVENOUS
  Filled 2013-10-07 (×19): qty 50

## 2013-10-07 MED ORDER — BIOTENE DRY MOUTH MT LIQD
15.0000 mL | Freq: Two times a day (BID) | OROMUCOSAL | Status: DC
  Administered 2013-10-07 – 2013-10-10 (×7): 15 mL via OROMUCOSAL

## 2013-10-07 MED ORDER — CITALOPRAM HYDROBROMIDE 40 MG PO TABS
40.0000 mg | ORAL_TABLET | Freq: Every day | ORAL | Status: DC
  Filled 2013-10-07: qty 1

## 2013-10-07 MED ORDER — PANTOPRAZOLE SODIUM 40 MG IV SOLR
40.0000 mg | Freq: Every day | INTRAVENOUS | Status: DC
  Administered 2013-10-07 – 2013-10-10 (×4): 40 mg via INTRAVENOUS
  Filled 2013-10-07 (×8): qty 40

## 2013-10-07 MED ORDER — ONDANSETRON HCL 4 MG/2ML IJ SOLN
4.0000 mg | Freq: Four times a day (QID) | INTRAMUSCULAR | Status: DC | PRN
  Administered 2013-10-10: 4 mg via INTRAVENOUS
  Filled 2013-10-07: qty 2

## 2013-10-07 NOTE — Progress Notes (Signed)
Patient reports that Fentanyl is not relieving her pain;says she had Dilaudid on her last admission and it helped.  Denies any problems or adverse effects from Dilaudid.  MD paged.

## 2013-10-07 NOTE — Progress Notes (Addendum)
Subjective: She says her abdomen is still sore.  Wanted to know the chances of having colostomy before going home.  Objective: Vital signs in last 24 hours: Temp:  [97.8 F (36.6 C)-98.9 F (37.2 C)] 98.5 F (36.9 C) (10/25 0400) Pulse Rate:  [77-116] 78 (10/25 0700) Resp:  [13-23] 23 (10/25 0700) BP: (80-129)/(39-74) 129/68 mmHg (10/25 0700) SpO2:  [84 %-100 %] 97 % (10/25 0700) Weight:  [129 kg (284 lb 6.3 oz)] 129 kg (284 lb 6.3 oz) (10/25 0156)  NPO Afebrile, VSS,  Labs OK WBC 9.0  Intake/Output from previous day: 10/24 0701 - 10/25 0700 In: 410 [I.V.:360; IV Piggyback:50] Out: 125 [Urine:125] Intake/Output this shift:    General appearance: alert, cooperative and no distress GI: soft, tender LLQ, few BS, no distension  Lab Results:   Recent Labs  10/06/13 2100 10/07/13 0540  WBC 11.4* 9.0  HGB 13.6 12.6  HCT 39.7 38.2  PLT 324 281    BMET  Recent Labs  10/06/13 2100 10/07/13 0540  NA 137 136  K 3.8 3.9  CL 102 103  CO2 25 23  GLUCOSE 125* 120*  BUN 9 7  CREATININE 0.68 0.62  CALCIUM 9.4 8.0*   PT/INR No results found for this basename: LABPROT, INR,  in the last 72 hours   Recent Labs Lab 10/06/13 2100  AST 25  ALT 41*  ALKPHOS 79  BILITOT 0.2*  PROT 7.1  ALBUMIN 3.4*     Lipase     Component Value Date/Time   LIPASE 35 10/06/2013 2100     Studies/Results: Ct Abdomen Pelvis W Contrast  10/06/2013   CLINICAL DATA:  Left lower quadrant abdominal pain. History of diverticulitis.  EXAM: CT ABDOMEN AND PELVIS WITH CONTRAST  TECHNIQUE: Multidetector CT imaging of the abdomen and pelvis was performed using the standard protocol following bolus administration of intravenous contrast.  CONTRAST:  OMNIPAQUE IOHEXOL 300 MG/ML  SOLN  COMPARISON:  CT of the abdomen and pelvis from 06/08/2013  FINDINGS: Minimal bibasilar atelectasis is noted.  There is diffuse fatty infiltration of the liver, with mild sparing about the IVC. The liver  and spleen are otherwise unremarkable in appearance. The gallbladder is within normal limits. The pancreas and adrenal glands are unremarkable.  The kidneys are unremarkable in appearance. There is no evidence of hydronephrosis. No renal or ureteral stones are seen. No perinephric stranding is appreciated.  No free fluid is identified. The small bowel is unremarkable in appearance. The stomach is within normal limits. No acute vascular abnormalities are seen.  The appendix is normal in caliber and contains air, without evidence of appendicitis.  There appears to be a small focal perforation at the proximal sigmoid colon, with extension to a small collection of air and fluid measuring 2.6 x 2.3 cm just above the left ovary. Surrounding soft tissue inflammation is seen. This may reflect mild abscess formation. Minimal soft tissue inflammation is noted along the sigmoid colon, with associated diverticulosis. Findings are compatible with focal acute diverticulitis.  The bladder is mildly distended and grossly unremarkable in appearance. The uterus is within normal limits; an intrauterine device is noted at the fundus of the uterus. The ovaries are relatively symmetric with no suspicious adnexal masses are subtle seen. No inguinal lymphadenopathy is seen.  No acute osseous abnormalities are identified.  IMPRESSION: 1. Diverticulitis involving the proximal sigmoid colon, with an apparent small focal perforation of the proximal sigmoid colon, and extension to a small collection of air and  fluid measuring 2.6 x 2.3 cm just above the left ovary, with surrounding soft tissue inflammation. This may reflect mild abscess formation. 2. Diffuse fatty infiltration within the liver. These results were called by telephone at the time of interpretation on 10/06/2013 at 10:58 PM to Dr. Blane Ohara , who verbally acknowledged these results.   Electronically Signed   By: Roanna Raider M.D.   On: 10/06/2013 22:58    Medications: .  lamoTRIgine  200 mg Oral QID  . levETIRAcetam  500 mg Oral Daily  . pantoprazole (PROTONIX) IV  40 mg Intravenous QHS  . piperacillin-tazobactam (ZOSYN)  IV  3.375 g Intravenous Q8H   Assessment/Plan 1. Diverticulitis with perforation (2.6 x 2.3 cm collection above left ovary) 2. Hx of Migraines 3.  Grand Mal Seizures on  4.  Depression 5.  Body mass index is 47.33 6.  Tobacco use   Plan:  Medical management for now. Bowel rest, hydration, seizure med and Celexa with sips only. Start Lovenox/SCD's  for DVT prophylaxis. I think she can transfer to floor.   LOS: 1 day   JENNINGS,WILLARD 10/07/2013  Agree with above. On disability because of "uncontrolled" seizures.  She sees Dr. Excell Seltzer in [place] as her neurologist.  Dr. Welton Flakes is her PCP. Her morbid obesity would make emergent surgery very difficult.  Ovidio Kin, MD, [facility] Pager: [phone] Office phone:  [phone]

## 2013-10-07 NOTE — H&P (Signed)
Rachel [name] is an 32 y.o. female.   Chief Complaint: abdominal pain HPI:  The patient is a 32 year old white female who presents to the mesh are with abdominal pain that started on Tuesday of this week. She has had a couple episodes of nausea but no vomiting. She denies any fevers or chills. The pain has gradually gotten worse since Tuesday. A CT scan showed that she has evidence of sigmoid diverticulitis with a small contained perforation. She apparently had an episode of diverticulitis similar to the singeing of this year and got better on antibiotics. She is followed by Dr. Lindie Spruce  Past Medical History  Diagnosis Date  . Migraines     "haven't had one since I was a kid" (06/05/2013)  . 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 Depression   . Epilepsy   . Diverticulosis     Past Surgical History  Procedure Laterality Date  . Carpal tunnel release Left 2011?    No family history on file. Social History:  reports that she has been smoking Cigarettes.  She has a 18 pack-year smoking history. She has never used smokeless tobacco. She reports that she does not drink alcohol or use illicit drugs.  Allergies:  Allergies  Allergen Reactions  . Morphine And Related     itching  . Phenergan [Promethazine Hcl]     vomiting     (Not in a hospital admission)  Results for orders placed during the hospital encounter of 10/06/13 (from the past 48 hour(s))  URINALYSIS, ROUTINE W REFLEX MICROSCOPIC     Status: Abnormal   Collection Time    10/06/13  8:49 PM      Result Value Range   Color, Urine YELLOW  YELLOW   APPearance CLEAR  CLEAR   Specific Gravity, Urine 1.021  1.005 - 1.030   pH 6.0  5.0 - 8.0   Glucose, UA NEGATIVE  NEGATIVE mg/dL   Hgb urine dipstick SMALL (*) NEGATIVE   Bilirubin Urine NEGATIVE  NEGATIVE   Ketones, ur NEGATIVE  NEGATIVE mg/dL   Protein, ur NEGATIVE  NEGATIVE mg/dL   Urobilinogen, UA 1.0  0.0 - 1.0  mg/dL   Nitrite NEGATIVE  NEGATIVE   Leukocytes, UA NEGATIVE  NEGATIVE  PREGNANCY, URINE     Status: None   Collection Time    10/06/13  8:49 PM      Result Value Range   Preg Test, Ur NEGATIVE  NEGATIVE   Comment:            THE SENSITIVITY OF THIS     METHODOLOGY IS >20 mIU/mL.  URINE MICROSCOPIC-ADD ON     Status: Abnormal   Collection Time    10/06/13  8:49 PM      Result Value Range   Squamous Epithelial / LPF RARE  RARE   WBC, UA 0-2  <3 WBC/hpf   RBC / HPF 0-2  <3 RBC/hpf   Bacteria, UA RARE  RARE   Casts HYALINE CASTS (*) NEGATIVE  CBC WITH DIFFERENTIAL     Status: Abnormal   Collection Time    10/06/13  9:00 PM      Result Value Range   WBC 11.4 (*) 4.0 - 10.5 K/uL   RBC 4.66  3.87 - 5.11 MIL/uL   Hemoglobin 13.6  12.0 - 15.0 g/dL   HCT 16.1  09.6 - 04.5 %   MCV 85.2  78.0 -  100.0 fL   MCH 29.2  26.0 - 34.0 pg   MCHC 34.3  30.0 - 36.0 g/dL   RDW 16.1  09.6 - 04.5 %   Platelets 324  150 - 400 K/uL   Neutrophils Relative % 64  43 - 77 %   Neutro Abs 7.3  1.7 - 7.7 K/uL   Lymphocytes Relative 25  12 - 46 %   Lymphs Abs 2.8  0.7 - 4.0 K/uL   Monocytes Relative 9  3 - 12 %   Monocytes Absolute 1.0  0.1 - 1.0 K/uL   Eosinophils Relative 2  0 - 5 %   Eosinophils Absolute 0.2  0.0 - 0.7 K/uL   Basophils Relative 0  0 - 1 %   Basophils Absolute 0.0  0.0 - 0.1 K/uL  COMPREHENSIVE METABOLIC PANEL     Status: Abnormal   Collection Time    10/06/13  9:00 PM      Result Value Range   Sodium 137  135 - 145 mEq/L   Potassium 3.8  3.5 - 5.1 mEq/L   Chloride 102  96 - 112 mEq/L   CO2 25  19 - 32 mEq/L   Glucose, Bld 125 (*) 70 - 99 mg/dL   BUN 9  6 - 23 mg/dL   Creatinine, Ser 4.09  0.50 - 1.10 mg/dL   Calcium 9.4  8.4 - 81.1 mg/dL   Total Protein 7.1  6.0 - 8.3 g/dL   Albumin 3.4 (*) 3.5 - 5.2 g/dL   AST 25  0 - 37 U/L   ALT 41 (*) 0 - 35 U/L   Alkaline Phosphatase 79  39 - 117 U/L   Total Bilirubin 0.2 (*) 0.3 - 1.2 mg/dL   GFR calc non Af Amer >90  >90 mL/min    GFR calc Af Amer >90  >90 mL/min   Comment: (NOTE)     The eGFR has been calculated using the CKD EPI equation.     This calculation has not been validated in all clinical situations.     eGFR's persistently <90 mL/min signify possible Chronic Kidney     Disease.  LIPASE, BLOOD     Status: None   Collection Time    10/06/13  9:00 PM      Result Value Range   Lipase 35  11 - 59 U/L  LACTIC ACID, PLASMA     Status: None   Collection Time    10/06/13 11:44 PM      Result Value Range   Lactic Acid, Venous 1.1  0.5 - 2.2 mmol/L   Ct Abdomen Pelvis W Contrast  10/06/2013   CLINICAL DATA:  Left lower quadrant abdominal pain. History of diverticulitis.  EXAM: CT ABDOMEN AND PELVIS WITH CONTRAST  TECHNIQUE: Multidetector CT imaging of the abdomen and pelvis was performed using the standard protocol following bolus administration of intravenous contrast.  CONTRAST:  OMNIPAQUE IOHEXOL 300 MG/ML  SOLN  COMPARISON:  CT of the abdomen and pelvis from 06/08/2013  FINDINGS: Minimal bibasilar atelectasis is noted.  There is diffuse fatty infiltration of the liver, with mild sparing about the IVC. The liver and spleen are otherwise unremarkable in appearance. The gallbladder is within normal limits. The pancreas and adrenal glands are unremarkable.  The kidneys are unremarkable in appearance. There is no evidence of hydronephrosis. No renal or ureteral stones are seen. No perinephric stranding is appreciated.  No free fluid is identified. The small bowel is unremarkable in  appearance. The stomach is within normal limits. No acute vascular abnormalities are seen.  The appendix is normal in caliber and contains air, without evidence of appendicitis.  There appears to be a small focal perforation at the proximal sigmoid colon, with extension to a small collection of air and fluid measuring 2.6 x 2.3 cm just above the left ovary. Surrounding soft tissue inflammation is seen. This may reflect mild abscess  formation. Minimal soft tissue inflammation is noted along the sigmoid colon, with associated diverticulosis. Findings are compatible with focal acute diverticulitis.  The bladder is mildly distended and grossly unremarkable in appearance. The uterus is within normal limits; an intrauterine device is noted at the fundus of the uterus. The ovaries are relatively symmetric with no suspicious adnexal masses are subtle seen. No inguinal lymphadenopathy is seen.  No acute osseous abnormalities are identified.  IMPRESSION: 1. Diverticulitis involving the proximal sigmoid colon, with an apparent small focal perforation of the proximal sigmoid colon, and extension to a small collection of air and fluid measuring 2.6 x 2.3 cm just above the left ovary, with surrounding soft tissue inflammation. This may reflect mild abscess formation. 2. Diffuse fatty infiltration within the liver. These results were called by telephone at the time of interpretation on 10/06/2013 at 10:58 PM to Dr. Blane Ohara , who verbally acknowledged these results.   Electronically Signed   By: Roanna Raider M.D.   On: 10/06/2013 22:58    Review of Systems  Constitutional: Negative.   HENT: Negative.   Eyes: Negative.   Respiratory: Negative.   Cardiovascular: Negative.   Gastrointestinal: Positive for nausea and abdominal pain.  Genitourinary: Negative.   Musculoskeletal: Negative.   Skin: Negative.   Neurological: Negative.   Endo/Heme/Allergies: Negative.   Psychiatric/Behavioral: Negative.     Blood pressure 91/50, pulse 93, temperature 98.9 F (37.2 C), temperature source Oral, resp. rate 20, SpO2 91.00%. Physical Exam  Constitutional: She is oriented to person, place, and time. She appears well-developed and well-nourished.  Obese wf  HENT:  Head: Normocephalic and atraumatic.  Eyes: Conjunctivae and EOM are normal. Pupils are equal, round, and reactive to light.  Neck: Normal range of motion. Neck supple.   Cardiovascular: Normal rate, regular rhythm and normal heart sounds.   Respiratory: Effort normal and breath sounds normal.  GI: Soft. Bowel sounds are normal.  There is focal tenderness in the LLQ. No peritonitis  Musculoskeletal: Normal range of motion.  Neurological: She is alert and oriented to person, place, and time.  Skin: Skin is warm and dry.  Psychiatric: She has a normal mood and affect. Her behavior is normal.     Assessment/Plan The patient appears to have sigmoid diverticulitis with a small area of contained perforation. She does not exhibit generalized peritonitis. Because of this I think she is a good candidate for bowel rest and broad-spectrum antibiotic therapy. If she improves on antibiotics then I think she has a chance of having a one stage operation in avoiding a colostomy and having her surgery done electively in several weeks. If she does not improve then she may require sigmoid colectomy with colostomy. I have discussed the options with her and she understands and agrees with this treatment plan  TOTH III,Mardell Suttles S 10/07/2013, 12:54 AM

## 2013-10-08 NOTE — Progress Notes (Signed)
General Surgery Note  LOS: 2 days  POD -     Assessment/Plan: 1. Diverticulitis with perforation (2.6 x 2.3 cm collection above left ovary)   On Zosyn  No labs for today.  About the same as yesterday.  Continue NPO, check labs in AM, ambulate.    2.  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 Excell Seltzer in [place] as her neurologist.  4.  Depression  5.  Body mass index is 47.33  6.  Tobacco use 7.  On disability secondary to seizures 8.  DVT prophylaxis - Lovenox  Subjective:  About the same.  The patient was under the impression she is to be at bedrest.  No flatus or BM. Objective:   Filed Vitals:   10/08/13 0551  BP: 103/54  Pulse: 66  Temp: 97.6 F (36.4 C)  Resp: 16     Intake/Output from previous day:  10/25 0701 - 10/26 0700 In: 2450 [I.V.:2400; IV Piggyback:50] Out: -   Intake/Output this shift:      Physical Exam:   General: Obese WF who is alert and oriented.    HEENT: Normal. Pupils equal. .   Lungs: Clear.   Abdomen: BS present.  Moderate tenderness in the LLQ - no peritoneal sxes.   Lab Results:    Recent Labs  10/06/13 2100 10/07/13 0540  WBC 11.4* 9.0  HGB 13.6 12.6  HCT 39.7 38.2  PLT 324 281    BMET   Recent Labs  10/06/13 2100 10/07/13 0540  NA 137 136  K 3.8 3.9  CL 102 103  CO2 25 23  GLUCOSE 125* 120*  BUN 9 7  CREATININE 0.68 0.62  CALCIUM 9.4 8.0*    PT/INR  No results found for this basename: LABPROT, INR,  in the last 72 hours  ABG  No results found for this basename: PHART, PCO2, PO2, HCO3,  in the last 72 hours   Studies/Results:  Ct Abdomen Pelvis W Contrast  10/06/2013   CLINICAL DATA:  Left lower quadrant abdominal pain. History of diverticulitis.  EXAM: CT ABDOMEN AND PELVIS WITH CONTRAST  TECHNIQUE: Multidetector CT imaging of the abdomen and pelvis was performed using the standard protocol following bolus administration of intravenous contrast.  CONTRAST:  OMNIPAQUE IOHEXOL 300 MG/ML  SOLN   COMPARISON:  CT of the abdomen and pelvis from 06/08/2013  FINDINGS: Minimal bibasilar atelectasis is noted.  There is diffuse fatty infiltration of the liver, with mild sparing about the IVC. The liver and spleen are otherwise unremarkable in appearance. The gallbladder is within normal limits. The pancreas and adrenal glands are unremarkable.  The kidneys are unremarkable in appearance. There is no evidence of hydronephrosis. No renal or ureteral stones are seen. No perinephric stranding is appreciated.  No free fluid is identified. The small bowel is unremarkable in appearance. The stomach is within normal limits. No acute vascular abnormalities are seen.  The appendix is normal in caliber and contains air, without evidence of appendicitis.  There appears to be a small focal perforation at the proximal sigmoid colon, with extension to a small collection of air and fluid measuring 2.6 x 2.3 cm just above the left ovary. Surrounding soft tissue inflammation is seen. This may reflect mild abscess formation. Minimal soft tissue inflammation is noted along the sigmoid colon, with associated diverticulosis. Findings are compatible with focal acute diverticulitis.  The bladder is mildly distended and grossly unremarkable in appearance. The uterus is within normal  limits; an intrauterine device is noted at the fundus of the uterus. The ovaries are relatively symmetric with no suspicious adnexal masses are subtle seen. No inguinal lymphadenopathy is seen.  No acute osseous abnormalities are identified.  IMPRESSION: 1. Diverticulitis involving the proximal sigmoid colon, with an apparent small focal perforation of the proximal sigmoid colon, and extension to a small collection of air and fluid measuring 2.6 x 2.3 cm just above the left ovary, with surrounding soft tissue inflammation. This may reflect mild abscess formation. 2. Diffuse fatty infiltration within the liver. These results were called by telephone at the time  of interpretation on 10/06/2013 at 10:58 PM to Dr. Blane Ohara , who verbally acknowledged these results.   Electronically Signed   By: Roanna Raider M.D.   On: 10/06/2013 22:58     Anti-infectives:   Anti-infectives   Start     Dose/Rate Route Frequency Ordered Stop   10/07/13 0600  piperacillin-tazobactam (ZOSYN) IVPB 3.375 g     3.375 g 12.5 mL/hr over 240 Minutes Intravenous 3 times per day 10/07/13 0207     10/06/13 2300  piperacillin-tazobactam (ZOSYN) IVPB 3.375 g     3.375 g 12.5 mL/hr over 240 Minutes Intravenous  Once 10/06/13 2259 10/07/13 0112      Ovidio Kin, MD, FACS Pager: [phone] [facility] Office: [phone] 10/08/2013

## 2013-10-09 LAB — CBC WITH DIFFERENTIAL/PLATELET
Basophils Relative: 1 % (ref 0–1)
Eosinophils Absolute: 0.3 10*3/uL (ref 0.0–0.7)
HCT: 36.4 % (ref 36.0–46.0)
Hemoglobin: 12 g/dL (ref 12.0–15.0)
MCH: 28.5 pg (ref 26.0–34.0)
MCHC: 33 g/dL (ref 30.0–36.0)
Monocytes Absolute: 0.5 10*3/uL (ref 0.1–1.0)
Monocytes Relative: 8 % (ref 3–12)
Neutro Abs: 3.2 10*3/uL (ref 1.7–7.7)
Platelets: 302 10*3/uL (ref 150–400)
WBC: 5.9 10*3/uL (ref 4.0–10.5)

## 2013-10-09 LAB — BASIC METABOLIC PANEL
BUN: <3 mg/dL — ABNORMAL LOW (ref 6–23)
Creatinine, Ser: 0.73 mg/dL (ref 0.50–1.10)
GFR calc Af Amer: >90 mL/min (ref >90)
GFR calc non Af Amer: >90 mL/min (ref >90)

## 2013-10-09 MED ORDER — SODIUM CHLORIDE 0.9 % IJ SOLN
10.0000 mL | INTRAMUSCULAR | Status: DC | PRN
  Administered 2013-10-11: 10 mL

## 2013-10-09 NOTE — Progress Notes (Signed)
Patient ID: Rachel [name], female   DOB: [birth year], 32 y.o.   MRN: [id number]    Subjective: Patient endorses LLQ abdominal pain through out the night, wakening her at times and needing pain medications. She has not has BM since last Thursday, she has experienced flatus. She denies nausea, vomit or diarrhea. She reports ambulating regularly. She desires to eat applesauce or pop sickles.   Objective: Vital signs in last 24 hours: Temp:  [97.7 F (36.5 C)-98.5 F (36.9 C)] 98.4 F (36.9 C) (10/27 0527) Pulse Rate:  [73-77] 73 (10/27 0527) Resp:  [16-18] 16 (10/27 0527) BP: (107-125)/(59-73) 111/59 mmHg (10/27 0527) SpO2:  [96 %-100 %] 96 % (10/27 0527) Last BM Date: 10/05/13  Intake/Output from previous day: 10/26 0701 - 10/27 0700 In: 950 [I.V.:800; IV Piggyback:150] Out: -  Intake/Output this shift:    PE: Gen: Afebrile. NAD. Lying in bed, watching TV.  Chest: CTAB, no wheeze or crackles Abd: Soft. Morbidly obese. ND. Tender LLQ > LUQ. BS hypoactive. No Masses palpated.  Ext: No erythema, no edema or tenderness. SCD's in room, not on patient. Patient removed last night.   Lab Results:   Recent Labs  10/07/13 0540 10/09/13 0507  WBC 9.0 5.9  HGB 12.6 12.0  HCT 38.2 36.4  PLT 281 302   BMET  Recent Labs  10/07/13 0540 10/09/13 0507  NA 136 135  K 3.9 4.0  CL 103 101  CO2 23 27  GLUCOSE 120* 109*  BUN 7 <3*  CREATININE 0.62 0.73  CALCIUM 8.0* 8.6   PT/INR No results found for this basename: LABPROT, INR,  in the last 72 hours CMP     Component Value Date/Time   NA 135 10/09/2013 0507   K 4.0 10/09/2013 0507   CL 101 10/09/2013 0507   CO2 27 10/09/2013 0507   GLUCOSE 109* 10/09/2013 0507   BUN <3* 10/09/2013 0507   CREATININE 0.73 10/09/2013 0507   CALCIUM 8.6 10/09/2013 0507   PROT 7.1 10/06/2013 2100   ALBUMIN 3.4* 10/06/2013 2100   AST 25 10/06/2013 2100   ALT 41* 10/06/2013 2100   ALKPHOS 79 10/06/2013 2100   BILITOT 0.2* 10/06/2013 2100    GFRNONAA >90 10/09/2013 0507   GFRAA >90 10/09/2013 0507   Lipase     Component Value Date/Time   LIPASE 35 10/06/2013 2100   Studies/Results: No results found.  Anti-infectives: Anti-infectives   Start     Dose/Rate Route Frequency Ordered Stop   10/07/13 0600  piperacillin-tazobactam (ZOSYN) IVPB 3.375 g     3.375 g 12.5 mL/hr over 240 Minutes Intravenous 3 times per day 10/07/13 0207     10/06/13 2300  piperacillin-tazobactam (ZOSYN) IVPB 3.375 g     3.375 g 12.5 mL/hr over 240 Minutes Intravenous  Once 10/06/13 2259 10/07/13 0112     Assessment: 1. Diverticulitis (Sigmoid) with contianed perforation (2.6 x 2.3 cm collection above left ovary)  2. Hx of Migraines  3. 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 Depression on cylexa 5. Body mass index is 47.33  6. Tobacco use  Plan: - Diverticulitis: bowel rest and broad-spectrum antibiotic therapy (Zosyn started 10/24). If she improves on antibiotics then she has a chance of having a one stage operation and avoiding a colostomy and having her surgery done electively in several weeks. If she does not improve then she may require sigmoid colectomy with colostomy. Surfeon has discussed the options with her and she understands and agrees with this  treatment plan - PICC line insertion scheduled for this morning for long term antibiotic administration  - Diet: NPO. Bowel rest until abdominal pain improves, will then advance to clears.  - Continue to encourage ambulation - Continue Zosyn   LOS: 3 days    Felix Pacini 10/09/2013, 9:15 AM Pager: [phone]

## 2013-10-09 NOTE — Progress Notes (Signed)
Less LLQ tenderness Will start some clear liquid sips Continue abx  Wilmon Arms. Corliss Skains, MD, [facility]  General/ Trauma Surgery  10/09/2013 1:37 PM

## 2013-10-09 NOTE — Progress Notes (Signed)
Peripherally Inserted Central Catheter/Midline Placement  The IV Nurse has discussed with the patient and/or persons authorized to consent for the patient, the purpose of this procedure and the potential benefits and risks involved with this procedure.  The benefits include less needle sticks, lab draws from the catheter and patient may be discharged home with the catheter.  Risks include, but not limited to, infection, bleeding, blood clot (thrombus formation), and puncture of an artery; nerve damage and irregular heat beat.  Alternatives to this procedure were also discussed.  PICC/Midline Placement Documentation        Rachel [name] 10/09/2013, 10:21 AM

## 2013-10-10 NOTE — Progress Notes (Signed)
  Subjective: She says her abdomen is better, but still sore in the LLQ.  She fixated on having surgery, in the future.  Objective: Vital signs in last 24 hours: Temp:  [98 F (36.7 C)-98.2 F (36.8 C)] 98 F (36.7 C) (10/28 0538) Pulse Rate:  [68-75] 68 (10/28 0538) Resp:  [18] 18 (10/28 0538) BP: (117-132)/(52-79) 117/52 mmHg (10/28 0538) SpO2:  [97 %-99 %] 97 % (10/28 0538) Last BM Date: 10/05/13 Diet: clears 240 PO recorded. Afebrile, VSS Labs normal yesterday Intake/Output from previous day: 10/27 0701 - 10/28 0700 In: 3758.3 [P.O.:240; I.V.:3268.3; IV Piggyback:250] Out: -  Intake/Output this shift:    General appearance: alert, cooperative and no distress Resp: clear to auscultation bilaterally GI: soft, points to LLQ as site of pain, moving in bed better.  Lab Results:   Recent Labs  10/09/13 0507  WBC 5.9  HGB 12.0  HCT 36.4  PLT 302    BMET  Recent Labs  10/09/13 0507  NA 135  K 4.0  CL 101  CO2 27  GLUCOSE 109*  BUN <3*  CREATININE 0.73  CALCIUM 8.6   PT/INR No results found for this basename: LABPROT, INR,  in the last 72 hours   Recent Labs Lab 10/06/13 2100  AST 25  ALT 41*  ALKPHOS 79  BILITOT 0.2*  PROT 7.1  ALBUMIN 3.4*     Lipase     Component Value Date/Time   LIPASE 35 10/06/2013 2100     Studies/Results: No results found.  Medications: . antiseptic oral rinse  15 mL Mouth Rinse q12n4p  . chlorhexidine  15 mL Mouth Rinse BID  . citalopram  40 mg Oral QHS  . enoxaparin (LOVENOX) injection  40 mg Subcutaneous Q24H  . lamoTRIgine  200 mg Oral QID  . levETIRAcetam  500 mg Oral Daily  . pantoprazole (PROTONIX) IV  40 mg Intravenous QHS  . piperacillin-tazobactam (ZOSYN)  IV  3.375 g Intravenous Q8H    Assessment/Plan . Diverticulitis (Sigmoid) with contianed perforation (2.6 x 2.3 cm collection above left ovary)  2. Hx of Migraines  3. 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 Depression on cylexa  5.  Body mass index is 47.33  6. Tobacco use   Plan:  She thinks the plan is for surgery in 6 weeks.  Dr. Carolynne Edouard says PICC was just for IV access here, and she can follow up with Dr. Lindie Spruce. Will ambulate more and leave on clears for now.   LOS: 4 days    Suellen Durocher 10/10/2013

## 2013-10-10 NOTE — Progress Notes (Signed)
Still with significant LLQ tenderness, although this seems to be less than on admission. Afebrile. Pain slightly worse with eating clear liquids.  If no improvement tomorrow, will repeat CT scan.  Last CT was 10/24.    Clears for now.  Wilmon Arms. Corliss Skains, MD, [facility]  General/ Trauma Surgery  10/10/2013 1:20 PM

## 2013-10-11 LAB — BASIC METABOLIC PANEL
BUN: <3 mg/dL — ABNORMAL LOW (ref 6–23)
CO2: 27 mEq/L (ref 19–32)
Calcium: 8.7 mg/dL (ref 8.4–10.5)
GFR calc Af Amer: >90 mL/min (ref >90)
GFR calc non Af Amer: >90 mL/min (ref >90)
Potassium: 3.9 mEq/L (ref 3.5–5.1)
Sodium: 134 mEq/L — ABNORMAL LOW (ref 135–145)

## 2013-10-11 LAB — CBC
Hemoglobin: 12.6 g/dL (ref 12.0–15.0)
MCH: 28.5 pg (ref 26.0–34.0)
MCHC: 33.4 g/dL (ref 30.0–36.0)
MCV: 85.3 fL (ref 78.0–100.0)
RDW: 13.4 % (ref 11.5–15.5)

## 2013-10-11 MED ORDER — PANTOPRAZOLE SODIUM 40 MG PO TBEC
40.0000 mg | DELAYED_RELEASE_TABLET | Freq: Every day | ORAL | Status: DC
  Administered 2013-10-11: 40 mg via ORAL
  Filled 2013-10-11: qty 1

## 2013-10-11 MED ORDER — HYDROCORTISONE 1 % EX CREA
TOPICAL_CREAM | Freq: Two times a day (BID) | CUTANEOUS | Status: DC
  Administered 2013-10-11: 1 via TOPICAL
  Administered 2013-10-11 – 2013-10-12 (×2): via TOPICAL
  Filled 2013-10-11: qty 28

## 2013-10-11 NOTE — Progress Notes (Signed)
Patient ID: Rachel [name], female   DOB: May 10, 1981, 32 y.o.   MRN: [id number]    Subjective: She states she is feeling better today. She walked "3 laps" yesterday and tolerated it well with only one episode of dizziness, after initial standing. She has tolerated her clear liquid diet and wants her diet advanced today. She has not had a BM since last Thursday, but endorses flatus.   Objective: Vital signs in last 24 hours: Temp:  [97.8 F (36.6 C)-98.3 F (36.8 C)] 98.3 F (36.8 C) (10/29 0623) Pulse Rate:  [69-78] 71 (10/29 0623) Resp:  [17-18] 17 (10/29 0623) BP: (108-137)/(52-102) 108/52 mmHg (10/29 0623) SpO2:  [95 %-98 %] 98 % (10/29 0623) Last BM Date: 10/05/13 Diet: clears 240 PO recorded. Afebrile, VSS Labs normal yesterday Intake/Output from previous day: 10/28 0701 - 10/29 0700 In: 1591.7 [I.V.:1591.7] Out: -  Intake/Output this shift:   Gen: NAD. Sitting up in bed.  CV: RRR  Chest: CTAB, no wheeze or crackles Abd: Soft. ND. Mild LLQ TTP. BS hypoactive.  Ext: No erythema. No edema or tenderness. Skin: Posterior neck and scalp erythema with excoriations. No rashes noted elsewhere on body.   Lab Results:   Recent Labs  10/09/13 0507 10/11/13 0515  WBC 5.9 5.3  HGB 12.0 12.6  HCT 36.4 37.7  PLT 302 313    BMET  Recent Labs  10/09/13 0507 10/11/13 0515  NA 135 134*  K 4.0 3.9  CL 101 100  CO2 27 27  GLUCOSE 109* 121*  BUN <3* <3*  CREATININE 0.73 0.78  CALCIUM 8.6 8.7   PT/INR No results found for this basename: LABPROT, INR,  in the last 72 hours   Recent Labs Lab 10/06/13 2100  AST 25  ALT 41*  ALKPHOS 79  BILITOT 0.2*  PROT 7.1  ALBUMIN 3.4*     Lipase     Component Value Date/Time   LIPASE 35 10/06/2013 2100     Studies/Results: No results found.  Medications: . antiseptic oral rinse  15 mL Mouth Rinse q12n4p  . chlorhexidine  15 mL Mouth Rinse BID  . citalopram  40 mg Oral QHS  . enoxaparin (LOVENOX) injection  40  mg Subcutaneous Q24H  . lamoTRIgine  200 mg Oral QID  . levETIRAcetam  500 mg Oral Daily  . pantoprazole (PROTONIX) IV  40 mg Intravenous QHS  . piperacillin-tazobactam (ZOSYN)  IV  3.375 g Intravenous Q8H    Assessment/Plan 1. Diverticulitis (Sigmoid) with contianed perforation (2.6 x 2.3 cm collection above left ovary)  2. Hx of Migraines  3. 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 Depression on cylexa  5. Body mass index is 47.33  6. Tobacco use 7. Posterior Neck and scalp rash  Plan:  She thinks the plan is for surgery in 5- 6 weeks.  Dr. Carolynne Edouard says PICC was just for IV access here, and she can follow up with Dr. Lindie Spruce. Informed patient she will go home on oral antibiotics. She wants to keep her PICC line until surgery. Continue IV abx for now and will transition to oral abx prior to DC.  Abdominal pain improved today. Will advance to full liquids today.  Rash: New onset rash that started yesterday on posterior neck and scalp. Will order hydrocortisone cream and monitor. Unlikely drug reaction considering focal location. Possible reaction to pillow.  Patient needs to ambulate more, encourage to ambulate Q shift.    LOS: 5 days    Sabrine Patchen,  Zurii Hewes 10/11/2013

## 2013-10-11 NOTE — Progress Notes (Signed)
"  I feel much better" LLQ pain decreased.   Advanced to full liquids  Hopefully will be able to transition to PO abx tomorrow.  Home soon.  Wilmon Arms. Corliss Skains, MD, [facility]  General/ Trauma Surgery  10/11/2013 3:09 PM

## 2013-10-12 MED ORDER — AMOXICILLIN-POT CLAVULANATE 875-125 MG PO TABS
1.0000 | ORAL_TABLET | Freq: Two times a day (BID) | ORAL | Status: DC
  Administered 2013-10-12: 1 via ORAL
  Filled 2013-10-12: qty 1

## 2013-10-12 MED ORDER — POLYETHYLENE GLYCOL 3350 17 G PO PACK
17.0000 g | PACK | Freq: Every day | ORAL | Status: DC
  Administered 2013-10-12: 17 g via ORAL
  Filled 2013-10-12: qty 1

## 2013-10-12 MED ORDER — AMOXICILLIN-POT CLAVULANATE 875-125 MG PO TABS: 1.0000 | ORAL_TABLET | Freq: Two times a day (BID) | ORAL | Status: AC

## 2013-10-12 MED ORDER — HYDROCORTISONE 1 % EX CREA: TOPICAL_CREAM | Freq: Two times a day (BID) | CUTANEOUS | Status: DC

## 2013-10-12 MED ORDER — HYDROCODONE-ACETAMINOPHEN 5-325 MG PO TABS: 1.0000 | ORAL_TABLET | Freq: Four times a day (QID) | ORAL | Status: DC | PRN

## 2013-10-12 MED ORDER — HYDROCODONE-ACETAMINOPHEN 5-325 MG PO TABS: 1.0000 | ORAL_TABLET | ORAL | Status: DC | PRN

## 2013-10-12 NOTE — Discharge Summary (Signed)
Physician Discharge Summary  Rachel [name] EAV:[id number] DOB: 11/21/1981 DOA: 10/06/2013  PCP: Reva Bores, MD  Consultation: None  Admit date: 10/06/2013 Discharge date: 10/12/2013  Recommendations for Outpatient follow-up:  Follow-up Information   Follow up with WYATT, Marta Lamas, MD. Call in 2 weeks.   Specialty:  General Surgery   Contact information:   [address] [place], [address]  [place] [place] [address] [phone]      Discharge Diagnoses:  1. Diverticulitis (Sigmoid) with contianed perforation (2.6 x 2.3 cm collection above left ovary)  2. Hx of Migraines  3. 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 Depression 5. Tobacco use    Surgical Procedure: None  Discharge Condition: Stable Disposition: Home  Diet recommendation: Low-fiber x 2 weeks and then high fiber  Filed Weights   10/07/13 0156  Weight: 284 lb 6.3 oz (129 kg)    Filed Vitals:   10/12/13 1416  BP: 141/86  Pulse: 75  Temp: 97.9 F (36.6 C)  Resp: 16    Hospital Course:   Rachel [name] is a 32 y.o female with a history of Diverticulosis, Depression, Epilepsy, and Migraines.  She was admitted on 10/24 with diverticulitis with abscess.  This is her second admission since June.  She has been followed by Dr. Lindie Spruce as an outpatient.  Upon admission her CT showed a small contained perforation.  She was started on broad spectrum antibiotics and bowel rest.  PICC line was placed due to poor access.  Her diet was advanced as tolerated.  She was mobilized and her pain was treated accordingly, which improved over time.  She had a slightly elevated white count which normalized. Renal panel remained stable.  VS were stable.  On HD #6, antibiotics were switched to oral, she was tolerating a low-residue diet, denied any pain on examination.  Our preference was to keep her overnight to ensure that she remained stable and is able to tolerate the oral antibiotics.  Her preference was  to go home and therefore she is being discharged.  We discussed diet modifications, tobacco cessation, she will continue with antibiotics for 8 additional days.  We discussed warnings signs that warrant urgent referral.  She was encouraged to call with any questions or concerns.  She will follow-up with Dr. Lindie Spruce.     Discharge Instructions     Medication List         amoxicillin-clavulanate 875-125 MG per tablet  Commonly known as:  AUGMENTIN  Take 1 tablet by mouth every 12 (twelve) hours.     citalopram 40 MG tablet  Commonly known as:  CELEXA  Take 1 tablet (40 mg total) by mouth daily.     HYDROcodone-acetaminophen 5-325 MG per tablet  Commonly known as:  NORCO/VICODIN  Take 1 tablet by mouth every 6 (six) hours as needed.     hydrocortisone cream 1 %  Apply topically 2 (two) times daily.     lamoTRIgine 200 MG tablet  Commonly known as:  LAMICTAL  Take 200 mg by mouth 4 (four) times daily.     levETIRAcetam 500 MG 24 hr tablet  Commonly known as:  KEPPRA XR  Take 500 mg by mouth daily.     VITAMIN B 12 PO  Take 1 tablet by mouth daily.           Follow-up Information   Follow up with WYATT, Marta Lamas, MD. Call in 2 weeks.   Specialty:  General Surgery  Contact information:   [address], [address]  [facility] [place] [facility] [place] [address] [phone]        The results of significant diagnostics from this hospitalization (including imaging, microbiology, ancillary and laboratory) are listed below for reference.    Significant Diagnostic Studies: Ct Abdomen Pelvis W Contrast  10/06/2013   CLINICAL DATA:  Left lower quadrant abdominal pain. History of diverticulitis.  EXAM: CT ABDOMEN AND PELVIS WITH CONTRAST  TECHNIQUE: Multidetector CT imaging of the abdomen and pelvis was performed using the standard protocol following bolus administration of intravenous contrast.  CONTRAST:  OMNIPAQUE IOHEXOL 300 MG/ML  SOLN  COMPARISON:  CT of the  abdomen and pelvis from 06/08/2013  FINDINGS: Minimal bibasilar atelectasis is noted.  There is diffuse fatty infiltration of the liver, with mild sparing about the IVC. The liver and spleen are otherwise unremarkable in appearance. The gallbladder is within normal limits. The pancreas and adrenal glands are unremarkable.  The kidneys are unremarkable in appearance. There is no evidence of hydronephrosis. No renal or ureteral stones are seen. No perinephric stranding is appreciated.  No free fluid is identified. The small bowel is unremarkable in appearance. The stomach is within normal limits. No acute vascular abnormalities are seen.  The appendix is normal in caliber and contains air, without evidence of appendicitis.  There appears to be a small focal perforation at the proximal sigmoid colon, with extension to a small collection of air and fluid measuring 2.6 x 2.3 cm just above the left ovary. Surrounding soft tissue inflammation is seen. This may reflect mild abscess formation. Minimal soft tissue inflammation is noted along the sigmoid colon, with associated diverticulosis. Findings are compatible with focal acute diverticulitis.  The bladder is mildly distended and grossly unremarkable in appearance. The uterus is within normal limits; an intrauterine device is noted at the fundus of the uterus. The ovaries are relatively symmetric with no suspicious adnexal masses are subtle seen. No inguinal lymphadenopathy is seen.  No acute osseous abnormalities are identified.  IMPRESSION: 1. Diverticulitis involving the proximal sigmoid colon, with an apparent small focal perforation of the proximal sigmoid colon, and extension to a small collection of air and fluid measuring 2.6 x 2.3 cm just above the left ovary, with surrounding soft tissue inflammation. This may reflect mild abscess formation. 2. Diffuse fatty infiltration within the liver. These results were called by telephone at the time of interpretation on  10/06/2013 at 10:58 PM to Dr. Blane Ohara , who verbally acknowledged these results.   Electronically Signed   By: Roanna Raider M.D.   On: 10/06/2013 22:58    Microbiology: Recent Results (from the past 240 hour(s))  MRSA PCR SCREENING     Status: None   Collection Time    10/07/13  2:03 AM      Result Value Range Status   MRSA by PCR NEGATIVE  NEGATIVE Final   Comment:            The GeneXpert MRSA Assay (FDA     approved for NASAL specimens     only), is one component of a     comprehensive MRSA colonization     surveillance program. It is not     intended to diagnose MRSA     infection nor to guide or     monitor treatment for     MRSA infections.     Labs: Basic Metabolic Panel:  Recent Labs Lab 10/06/13 2100 10/07/13 0540  10/09/13 0507 10/11/13 0515  NA 137 136 135 134*  K 3.8 3.9 4.0 3.9  CL 102 103 101 100  CO2 25 23 27 27   GLUCOSE 125* 120* 109* 121*  BUN 9 7 <3* <3*  CREATININE 0.68 0.62 0.73 0.78  CALCIUM 9.4 8.0* 8.6 8.7   Liver Function Tests:  Recent Labs Lab 10/06/13 2100  AST 25  ALT 41*  ALKPHOS 79  BILITOT 0.2*  PROT 7.1  ALBUMIN 3.4*    Recent Labs Lab 10/06/13 2100  LIPASE 35   CBC:  Recent Labs Lab 10/06/13 2100 10/07/13 0540 10/09/13 0507 10/11/13 0515  WBC 11.4* 9.0 5.9 5.3  NEUTROABS 7.3  --  3.2  --   HGB 13.6 12.6 12.0 12.6  HCT 39.7 38.2 36.4 37.7  MCV 85.2 86.8 86.5 85.3  PLT 324 281 302 313    Active Problems:   * No active hospital problems. *   Time coordinating discharge: < 30 minutes  Signed:  Hamza Empson, ANP-BC

## 2013-10-12 NOTE — Progress Notes (Signed)
  Subjective: Pt denies pain, had dilaudid at 0121.  Last reported bm last Thursday.  Denies f/c/s.  Denies n/v.  States she wants to go home today.    Objective: Vital signs in last 24 hours: Temp:  [97.9 F (36.6 C)-98.5 F (36.9 C)] 98.1 F (36.7 C) (10/30 0607) Pulse Rate:  [65-82] 65 (10/30 0607) Resp:  [16-20] 20 (10/30 0607) BP: (115-140)/(52-69) 115/52 mmHg (10/30 0607) SpO2:  [97 %-99 %] 99 % (10/30 0607) Last BM Date: 10/05/13  Intake/Output from previous day: 10/29 0701 - 10/30 0700 In: 2671.7 [I.V.:2471.7; IV Piggyback:200] Out: -  Intake/Output this shift:    General appearance: alert, cooperative, appears stated age and no distress GI: soft, non-tender; bowel sounds normal; no masses,  no organomegaly  Lab Results:   Recent Labs  10/11/13 0515  WBC 5.3  HGB 12.6  HCT 37.7  PLT 313   BMET  Recent Labs  10/11/13 0515  NA 134*  K 3.9  CL 100  CO2 27  GLUCOSE 121*  BUN <3*  CREATININE 0.78  CALCIUM 8.7   Anti-infectives: Anti-infectives   Start     Dose/Rate Route Frequency Ordered Stop   10/07/13 0600  piperacillin-tazobactam (ZOSYN) IVPB 3.375 g     3.375 g 12.5 mL/hr over 240 Minutes Intravenous 3 times per day 10/07/13 0207     10/06/13 2300  piperacillin-tazobactam (ZOSYN) IVPB 3.375 g     3.375 g 12.5 mL/hr over 240 Minutes Intravenous  Once 10/06/13 2259 10/07/13 0112      Assessment/Plan: 1. Diverticulitis (Sigmoid) with contianed perforation (2.6 x 2.3 cm collection above left ovary)  2. Hx of Migraines  3. 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 Depression on cylexa  5. Body mass index is 47.33  6. Tobacco use   -change to PO ATBx -advance to soft diet -add oral pain medication, tolerated norco in the past -ambulate -SCDs, lovenox -DC IVF -hopefully home tomorrow   LOS: 6 days    Jill Stopka ANP-BC 10/12/2013 8:14 AM

## 2013-10-12 NOTE — Discharge Summary (Signed)
Rachel [name]. Corliss Skains, MD, [facility] Surgery  General/ Trauma Surgery  10/12/2013 3:03 PM

## 2013-10-12 NOTE — Progress Notes (Signed)
Pain free for 24 hours Tolerating low-fiber diet Tolerating Augmentin Discharge today - follow-up with Dr. Urbano Heir K. Corliss Skains, MD, [facility] Surgery  General/ Trauma Surgery  10/12/2013 2:26 PM

## 2013-10-31 ENCOUNTER — Encounter (INDEPENDENT_AMBULATORY_CARE_PROVIDER_SITE_OTHER): Payer: Self-pay | Admitting: General Surgery

## 2013-10-31 ENCOUNTER — Ambulatory Visit (INDEPENDENT_AMBULATORY_CARE_PROVIDER_SITE_OTHER): Payer: Medicare Other | Admitting: General Surgery

## 2013-10-31 VITALS — BP 126/80 | HR 100 | Temp 98.9°F | Resp 15 | Ht 65.0 in | Wt 278.6 lb

## 2013-10-31 DIAGNOSIS — K5792 Diverticulitis of intestine, part unspecified, without perforation or abscess without bleeding: Secondary | ICD-10-CM

## 2013-10-31 DIAGNOSIS — K5732 Diverticulitis of large intestine without perforation or abscess without bleeding: Secondary | ICD-10-CM

## 2013-10-31 NOTE — Progress Notes (Signed)
The patient is doing well currently but because she's had multiple bouts of diverticulitis we'll likely go ahead and have a sigmoid colectomy. This will be planned for the near future. She will get a 2 day GoLYTELY bowel prep along with oral antibiotics. The risks and benefits have been explained to the patient including the risk of anastomotic leakage and or disruption. Also because of her size she is at risk for wound infection.  Surgery will be planned for the future.

## 2013-11-01 ENCOUNTER — Telehealth (INDEPENDENT_AMBULATORY_CARE_PROVIDER_SITE_OTHER): Payer: Self-pay

## 2013-11-01 ENCOUNTER — Encounter (HOSPITAL_COMMUNITY): Payer: Self-pay | Admitting: Pharmacy Technician

## 2013-11-01 NOTE — Telephone Encounter (Signed)
Message copied by Brennan Bailey on Wed Nov 01, 2013  8:52 AM ------      Message from: Docia Chuck      Created: Wed Nov 01, 2013  8:37 AM      Regarding: Need follow up visit after surgery       She is schedule for 11/07/13 and the only follow I could find was 02 December but was to soon.  Can you find one for her and call her with it?            Thanks ------

## 2013-11-01 NOTE — Telephone Encounter (Signed)
Left appt info on VM 

## 2013-11-02 ENCOUNTER — Encounter (HOSPITAL_COMMUNITY)
Admission: RE | Admit: 2013-11-02 | Discharge: 2013-11-02 | Disposition: A | Discharge status: Home / Self Care | Payer: Medicare Other | Source: Ambulatory Visit | Attending: General Surgery | Admitting: General Surgery

## 2013-11-02 ENCOUNTER — Encounter (HOSPITAL_COMMUNITY): Payer: Self-pay

## 2013-11-02 DIAGNOSIS — Z01818 Encounter for other preprocedural examination: Secondary | ICD-10-CM | POA: Insufficient documentation

## 2013-11-02 DIAGNOSIS — Z01812 Encounter for preprocedural laboratory examination: Secondary | ICD-10-CM | POA: Insufficient documentation

## 2013-11-02 LAB — BASIC METABOLIC PANEL
Calcium: 9.4 mg/dL (ref 8.4–10.5)
Creatinine, Ser: 0.81 mg/dL (ref 0.50–1.10)
GFR calc Af Amer: >90 mL/min (ref >90)
GFR calc non Af Amer: >90 mL/min (ref >90)
Glucose, Bld: 124 mg/dL — ABNORMAL HIGH (ref 70–99)
Potassium: 3.7 mEq/L (ref 3.5–5.1)
Sodium: 136 mEq/L (ref 135–145)

## 2013-11-02 LAB — HCG, SERUM, QUALITATIVE: Preg, Serum: NEGATIVE

## 2013-11-02 LAB — TYPE AND SCREEN
ABO/RH(D): B POS
Antibody Screen: NEGATIVE

## 2013-11-02 LAB — CBC WITH DIFFERENTIAL/PLATELET
Eosinophils Absolute: 0.3 10*3/uL (ref 0.0–0.7)
Lymphocytes Relative: 27 % (ref 12–46)
Lymphs Abs: 2.4 10*3/uL (ref 0.7–4.0)
MCH: 27.7 pg (ref 26.0–34.0)
Neutro Abs: 5.8 10*3/uL (ref 1.7–7.7)
Neutrophils Relative %: 64 % (ref 43–77)
Platelets: 322 10*3/uL (ref 150–400)
RBC: 4.99 MIL/uL (ref 3.87–5.11)
WBC: 9 10*3/uL (ref 4.0–10.5)

## 2013-11-02 LAB — ABO/RH: ABO/RH(D): B POS

## 2013-11-02 NOTE — Pre-Procedure Instructions (Addendum)
Rachel [name]  11/02/2013   Your procedure is scheduled on: Tuesday, November 07, 2013*  Report to [facility] Short Stay (use Main Entrance "A'') at 9:00 AM.  Call this number if you have problems the morning of surgery: [phone]   Remember:   Do not eat food or drink liquids after midnight.   Take these medicines the morning of surgery with A SIP OF WATER:citalopram (CELEXA) 40 MG tablet,  lamoTRIgine (LAMICTAL) 200 MG tablet Stop taking Aspirin, vitamins and herbal medications. Do not take any NSAIDs ie: Ibuprofen, Advil, Naproxen or any medication containing Aspirin.  Do not wear jewelry, make-up or nail polish.  Do not wear lotions, powders, or perfumes. You may NOT wear deodorant.  Do not shave 48 hours prior to surgery.  Do not bring valuables to the hospital.  [facility] is not responsible for any belongings or valuables.               Contacts, dentures or bridgework may not be worn into surgery.  Leave suitcase in the car. After surgery it may be brought to your room.  For patients admitted to the hospital, discharge time is determined by your treatment team.               Patients discharged the day of surgery will not be allowed to drive home.  Name and phone number of your driver:   Special Instructions: Shower using CHG 2 nights before surgery and the night before surgery.  If you shower the day of surgery use CHG.  Use special wash - you have one bottle of CHG for all showers.  You should use approximately 1/3 of the bottle for each shower.   Please read over the following fact sheets that you were given: Pain Booklet, Coughing and Deep Breathing, Blood Transfusion Information and Surgical Site Infection Prevention

## 2013-11-06 MED ORDER — METRONIDAZOLE IN NACL 5-0.79 MG/ML-% IV SOLN
500.0000 mg | INTRAVENOUS | Status: AC
  Administered 2013-11-07: 500 mg via INTRAVENOUS
  Filled 2013-11-06: qty 100

## 2013-11-06 MED ORDER — DEXTROSE 5 % IV SOLN
3.0000 g | INTRAVENOUS | Status: AC
  Administered 2013-11-07: 3 g via INTRAVENOUS
  Filled 2013-11-06: qty 3000

## 2013-11-06 MED ORDER — ALVIMOPAN 12 MG PO CAPS
12.0000 mg | ORAL_CAPSULE | Freq: Once | ORAL | Status: AC
  Administered 2013-11-07: 12 mg via ORAL
  Filled 2013-11-06: qty 1

## 2013-11-06 MED ORDER — PEG 3350-KCL-NA BICARB-NACL 420 G PO SOLR
4000.0000 mL | Freq: Once | ORAL | Status: DC
  Filled 2013-11-06: qty 4000

## 2013-11-07 ENCOUNTER — Inpatient Hospital Stay (HOSPITAL_COMMUNITY)
Admission: RE | Admit: 2013-11-07 | Discharge: 2013-11-14 | DRG: 330 | Disposition: A | Discharge status: Home / Self Care | Payer: Medicare Other | Source: Ambulatory Visit | Attending: Surgery | Admitting: Surgery

## 2013-11-07 ENCOUNTER — Encounter (HOSPITAL_COMMUNITY): Payer: Medicare Other | Admitting: Certified Registered"

## 2013-11-07 ENCOUNTER — Inpatient Hospital Stay (HOSPITAL_COMMUNITY): Payer: Medicare Other | Admitting: Certified Registered"

## 2013-11-07 ENCOUNTER — Encounter (HOSPITAL_COMMUNITY): Admission: RE | Disposition: A | Discharge status: Home / Self Care | Payer: Self-pay | Source: Ambulatory Visit

## 2013-11-07 ENCOUNTER — Encounter (HOSPITAL_COMMUNITY): Payer: Self-pay | Admitting: *Deleted

## 2013-11-07 DIAGNOSIS — K5732 Diverticulitis of large intestine without perforation or abscess without bleeding: Principal | ICD-10-CM | POA: Diagnosis present

## 2013-11-07 DIAGNOSIS — G40909 Epilepsy, unspecified, not intractable, without status epilepticus: Secondary | ICD-10-CM | POA: Diagnosis present

## 2013-11-07 DIAGNOSIS — F17200 Nicotine dependence, unspecified, uncomplicated: Secondary | ICD-10-CM | POA: Diagnosis present

## 2013-11-07 DIAGNOSIS — K5730 Diverticulosis of large intestine without perforation or abscess without bleeding: Secondary | ICD-10-CM

## 2013-11-07 DIAGNOSIS — F319 Bipolar disorder, unspecified: Secondary | ICD-10-CM | POA: Diagnosis present

## 2013-11-07 DIAGNOSIS — K5792 Diverticulitis of intestine, part unspecified, without perforation or abscess without bleeding: Secondary | ICD-10-CM | POA: Diagnosis present

## 2013-11-07 DIAGNOSIS — Z6841 Body Mass Index (BMI) 40.0 and over, adult: Secondary | ICD-10-CM

## 2013-11-07 HISTORY — PX: COLOSTOMY REVISION: SHX5232

## 2013-11-07 SURGERY — COLECTOMY, SIGMOID, OPEN
Anesthesia: General | Site: Abdomen | Wound class: Clean Contaminated

## 2013-11-07 MED ORDER — NALOXONE HCL 0.4 MG/ML IJ SOLN
0.4000 mg | INTRAMUSCULAR | Status: DC | PRN
  Filled 2013-11-07: qty 1

## 2013-11-07 MED ORDER — LACTATED RINGERS IV SOLN
INTRAVENOUS | Status: DC | PRN
  Administered 2013-11-07 (×3): via INTRAVENOUS

## 2013-11-07 MED ORDER — DEXMEDETOMIDINE HCL IN NACL 200 MCG/50ML IV SOLN
0.4000 ug/kg/h | INTRAVENOUS | Status: DC
  Filled 2013-11-07: qty 50

## 2013-11-07 MED ORDER — ENOXAPARIN SODIUM 40 MG/0.4ML ~~LOC~~ SOLN: 40.0000 mg | SUBCUTANEOUS | Status: DC

## 2013-11-07 MED ORDER — FENTANYL CITRATE 0.05 MG/ML IJ SOLN
INTRAMUSCULAR | Status: DC | PRN
  Administered 2013-11-07: 50 ug via INTRAVENOUS
  Administered 2013-11-07 (×2): 100 ug via INTRAVENOUS
  Administered 2013-11-07: 50 ug via INTRAVENOUS

## 2013-11-07 MED ORDER — POVIDONE-IODINE 10 % EX OINT
TOPICAL_OINTMENT | CUTANEOUS | Status: AC
  Filled 2013-11-07: qty 28.35

## 2013-11-07 MED ORDER — LIDOCAINE HCL (CARDIAC) 20 MG/ML IV SOLN
INTRAVENOUS | Status: DC | PRN
  Administered 2013-11-07: 40 mg via INTRAVENOUS

## 2013-11-07 MED ORDER — ONDANSETRON HCL 4 MG/2ML IJ SOLN: 4.0000 mg | Freq: Once | INTRAMUSCULAR | Status: DC | PRN

## 2013-11-07 MED ORDER — OXYCODONE HCL 5 MG/5ML PO SOLN: 5.0000 mg | Freq: Once | ORAL | Status: DC | PRN

## 2013-11-07 MED ORDER — HYDROMORPHONE HCL PF 1 MG/ML IJ SOLN
INTRAMUSCULAR | Status: AC
  Filled 2013-11-07: qty 1

## 2013-11-07 MED ORDER — OXYCODONE HCL 5 MG PO TABS
5.0000 mg | ORAL_TABLET | Freq: Once | ORAL | Status: DC | PRN
Start: 2013-11-07 — End: 2013-11-07

## 2013-11-07 MED ORDER — SODIUM CHLORIDE 0.9 % IJ SOLN: 9.0000 mL | INTRAMUSCULAR | Status: DC | PRN

## 2013-11-07 MED ORDER — PROPOFOL 10 MG/ML IV BOLUS
INTRAVENOUS | Status: DC | PRN
  Administered 2013-11-07: 200 mg via INTRAVENOUS

## 2013-11-07 MED ORDER — ENOXAPARIN SODIUM 40 MG/0.4ML ~~LOC~~ SOLN
40.0000 mg | Freq: Two times a day (BID) | SUBCUTANEOUS | Status: DC
  Administered 2013-11-07 – 2013-11-14 (×14): 40 mg via SUBCUTANEOUS
  Filled 2013-11-07 (×21): qty 0.4

## 2013-11-07 MED ORDER — LORAZEPAM BOLUS VIA INFUSION: 1.0000 mg | INTRAVENOUS | Status: DC | PRN

## 2013-11-07 MED ORDER — HYDROMORPHONE HCL PF 1 MG/ML IJ SOLN
INTRAMUSCULAR | Status: DC | PRN
  Administered 2013-11-07: .5 mg via INTRAVENOUS

## 2013-11-07 MED ORDER — CEFAZOLIN SODIUM-DEXTROSE 2-3 GM-% IV SOLR
2.0000 g | Freq: Three times a day (TID) | INTRAVENOUS | Status: AC
  Administered 2013-11-07: 2 g via INTRAVENOUS
  Filled 2013-11-07: qty 50

## 2013-11-07 MED ORDER — METRONIDAZOLE IN NACL 5-0.79 MG/ML-% IV SOLN
500.0000 mg | Freq: Three times a day (TID) | INTRAVENOUS | Status: AC
  Administered 2013-11-07: 500 mg via INTRAVENOUS
  Filled 2013-11-07: qty 100

## 2013-11-07 MED ORDER — LAMOTRIGINE 200 MG PO TABS
200.0000 mg | ORAL_TABLET | Freq: Three times a day (TID) | ORAL | Status: DC
  Administered 2013-11-07 – 2013-11-14 (×20): 200 mg via ORAL
  Filled 2013-11-07 (×25): qty 1

## 2013-11-07 MED ORDER — GLYCOPYRROLATE 0.2 MG/ML IJ SOLN
INTRAMUSCULAR | Status: DC | PRN
  Administered 2013-11-07: .8 mg via INTRAVENOUS

## 2013-11-07 MED ORDER — HYDROMORPHONE HCL PF 1 MG/ML IJ SOLN
0.2500 mg | INTRAMUSCULAR | Status: DC | PRN
  Administered 2013-11-07: 16:00:00 via INTRAVENOUS
  Administered 2013-11-07 (×3): 0.5 mg via INTRAVENOUS

## 2013-11-07 MED ORDER — DIPHENHYDRAMINE HCL 12.5 MG/5ML PO ELIX
12.5000 mg | ORAL_SOLUTION | Freq: Four times a day (QID) | ORAL | Status: DC | PRN
  Filled 2013-11-07: qty 5

## 2013-11-07 MED ORDER — ONDANSETRON HCL 4 MG/2ML IJ SOLN
4.0000 mg | Freq: Four times a day (QID) | INTRAMUSCULAR | Status: DC | PRN
  Filled 2013-11-07: qty 2

## 2013-11-07 MED ORDER — LEVETIRACETAM 500 MG PO TABS
500.0000 mg | ORAL_TABLET | Freq: Every day | ORAL | Status: DC
  Administered 2013-11-07 – 2013-11-13 (×7): 500 mg via ORAL
  Filled 2013-11-07 (×8): qty 1

## 2013-11-07 MED ORDER — KCL IN DEXTROSE-NACL 20-5-0.45 MEQ/L-%-% IV SOLN
INTRAVENOUS | Status: AC
  Filled 2013-11-07: qty 1000

## 2013-11-07 MED ORDER — ONDANSETRON HCL 4 MG/2ML IJ SOLN
INTRAMUSCULAR | Status: DC | PRN
  Administered 2013-11-07 (×2): 4 mg via INTRAVENOUS

## 2013-11-07 MED ORDER — HYDROMORPHONE 0.3 MG/ML IV SOLN
INTRAVENOUS | Status: AC
Start: 2013-11-07 — End: 2013-11-07
  Administered 2013-11-07: 16:00:00
  Filled 2013-11-07: qty 25

## 2013-11-07 MED ORDER — PHENYLEPHRINE HCL 10 MG/ML IJ SOLN
10.0000 mg | INTRAVENOUS | Status: DC | PRN
  Administered 2013-11-07: 5 ug/min via INTRAVENOUS

## 2013-11-07 MED ORDER — CITALOPRAM HYDROBROMIDE 40 MG PO TABS
40.0000 mg | ORAL_TABLET | Freq: Every day | ORAL | Status: DC
  Administered 2013-11-08 – 2013-11-14 (×7): 40 mg via ORAL
  Filled 2013-11-07 (×7): qty 1

## 2013-11-07 MED ORDER — DIPHENHYDRAMINE HCL 50 MG/ML IJ SOLN
12.5000 mg | Freq: Four times a day (QID) | INTRAMUSCULAR | Status: DC | PRN
  Filled 2013-11-07: qty 0.25

## 2013-11-07 MED ORDER — HYDROMORPHONE HCL PF 1 MG/ML IJ SOLN
1.0000 mg | INTRAMUSCULAR | Status: DC | PRN
  Administered 2013-11-07 – 2013-11-09 (×14): 1 mg via INTRAVENOUS
  Filled 2013-11-07 (×13): qty 1

## 2013-11-07 MED ORDER — HYDROMORPHONE 0.3 MG/ML IV SOLN
INTRAVENOUS | Status: DC
  Administered 2013-11-07: 0.8 mg via INTRAVENOUS

## 2013-11-07 MED ORDER — ROCURONIUM BROMIDE 100 MG/10ML IV SOLN
INTRAVENOUS | Status: DC | PRN
  Administered 2013-11-07: 5 mg via INTRAVENOUS
  Administered 2013-11-07 (×2): 10 mg via INTRAVENOUS
  Administered 2013-11-07: 50 mg via INTRAVENOUS
  Administered 2013-11-07: 5 mg via INTRAVENOUS

## 2013-11-07 MED ORDER — LORAZEPAM 2 MG/ML IJ SOLN
1.0000 mg | INTRAMUSCULAR | Status: DC | PRN
  Administered 2013-11-07 – 2013-11-10 (×4): 1 mg via INTRAVENOUS
  Filled 2013-11-07 (×4): qty 1

## 2013-11-07 MED ORDER — ALBUTEROL SULFATE HFA 108 (90 BASE) MCG/ACT IN AERS
INHALATION_SPRAY | RESPIRATORY_TRACT | Status: DC | PRN
  Administered 2013-11-07: 4 via RESPIRATORY_TRACT

## 2013-11-07 MED ORDER — MIDAZOLAM HCL 5 MG/5ML IJ SOLN
INTRAMUSCULAR | Status: DC | PRN
  Administered 2013-11-07: 2 mg via INTRAVENOUS

## 2013-11-07 MED ORDER — ALVIMOPAN 12 MG PO CAPS
12.0000 mg | ORAL_CAPSULE | Freq: Two times a day (BID) | ORAL | Status: DC
  Administered 2013-11-08 – 2013-11-14 (×13): 12 mg via ORAL
  Filled 2013-11-07 (×16): qty 1

## 2013-11-07 MED ORDER — KCL IN DEXTROSE-NACL 20-5-0.45 MEQ/L-%-% IV SOLN
INTRAVENOUS | Status: DC
  Administered 2013-11-07 – 2013-11-14 (×15): via INTRAVENOUS
  Filled 2013-11-07 (×20): qty 1000

## 2013-11-07 MED ORDER — POVIDONE-IODINE 10 % EX OINT
TOPICAL_OINTMENT | CUTANEOUS | Status: DC | PRN
  Administered 2013-11-07: 1 via TOPICAL

## 2013-11-07 MED ORDER — ACETAMINOPHEN 325 MG PO TABS: 650.0000 mg | ORAL_TABLET | Freq: Four times a day (QID) | ORAL | Status: DC | PRN

## 2013-11-07 MED ORDER — SCOPOLAMINE 1 MG/3DAYS TD PT72
MEDICATED_PATCH | TRANSDERMAL | Status: DC | PRN
  Administered 2013-11-07: 1 via TRANSDERMAL

## 2013-11-07 MED ORDER — LORAZEPAM 2 MG/ML IJ SOLN
INTRAMUSCULAR | Status: AC
  Filled 2013-11-07: qty 1

## 2013-11-07 MED ORDER — NEOSTIGMINE METHYLSULFATE 1 MG/ML IJ SOLN
INTRAMUSCULAR | Status: DC | PRN
  Administered 2013-11-07: 5 mg via INTRAVENOUS

## 2013-11-07 MED ORDER — 0.9 % SODIUM CHLORIDE (POUR BTL) OPTIME
TOPICAL | Status: DC | PRN
  Administered 2013-11-07 (×2): 1000 mL

## 2013-11-07 SURGICAL SUPPLY — 67 items
BLADE SURG ROTATE 9660 (MISCELLANEOUS) ×2 IMPLANT
CANISTER SUCTION 2500CC (MISCELLANEOUS) ×2 IMPLANT
CHLORAPREP W/TINT 26ML (MISCELLANEOUS) ×2 IMPLANT
COVER MAYO STAND STRL (DRAPES) ×4 IMPLANT
COVER SURGICAL LIGHT HANDLE (MISCELLANEOUS) ×2 IMPLANT
COVER TABLE BACK 60X90 (DRAPES) ×2 IMPLANT
DRAPE LAPAROSCOPIC ABDOMINAL (DRAPES) ×2 IMPLANT
DRAPE PROXIMA HALF (DRAPES) ×4 IMPLANT
DRAPE UTILITY 15X26 W/TAPE STR (DRAPE) ×10 IMPLANT
DRAPE WARM FLUID 44X44 (DRAPE) ×2 IMPLANT
DRSG OPSITE POSTOP 4X10 (GAUZE/BANDAGES/DRESSINGS) IMPLANT
DRSG OPSITE POSTOP 4X8 (GAUZE/BANDAGES/DRESSINGS) ×2 IMPLANT
ELECT BLADE 6.5 EXT (BLADE) ×2 IMPLANT
ELECT CAUTERY BLADE 6.4 (BLADE) ×4 IMPLANT
ELECT REM PT RETURN 9FT ADLT (ELECTROSURGICAL) ×2
ELECTRODE REM PT RTRN 9FT ADLT (ELECTROSURGICAL) ×1 IMPLANT
GEL ULTRASOUND 20GR AQUASONIC (MISCELLANEOUS) ×2 IMPLANT
GLOVE BIO SURGEON STRL SZ 6.5 (GLOVE) ×6 IMPLANT
GLOVE BIO SURGEON STRL SZ7 (GLOVE) ×2 IMPLANT
GLOVE BIO SURGEON STRL SZ7.5 (GLOVE) ×4 IMPLANT
GLOVE BIO SURGEON STRL SZ8 (GLOVE) ×4 IMPLANT
GLOVE BIOGEL PI IND STRL 7.0 (GLOVE) ×2 IMPLANT
GLOVE BIOGEL PI IND STRL 7.5 (GLOVE) ×2 IMPLANT
GLOVE BIOGEL PI IND STRL 8 (GLOVE) ×4 IMPLANT
GLOVE BIOGEL PI INDICATOR 7.0 (GLOVE) ×2
GLOVE BIOGEL PI INDICATOR 7.5 (GLOVE) ×2
GLOVE BIOGEL PI INDICATOR 8 (GLOVE) ×4
GLOVE ECLIPSE 7.5 STRL STRAW (GLOVE) ×4 IMPLANT
GLOVE SURG ORTHO 8.0 STRL STRW (GLOVE) ×4 IMPLANT
GOWN STRL NON-REIN LRG LVL3 (GOWN DISPOSABLE) ×12 IMPLANT
GOWN STRL REIN XL XLG (GOWN DISPOSABLE) ×6 IMPLANT
KIT BASIN OR (CUSTOM PROCEDURE TRAY) ×2 IMPLANT
KIT ROOM TURNOVER OR (KITS) ×2 IMPLANT
LEGGING LITHOTOMY PAIR STRL (DRAPES) ×2 IMPLANT
LIGASURE IMPACT 36 18CM CVD LR (INSTRUMENTS) ×2 IMPLANT
NS IRRIG 1000ML POUR BTL (IV SOLUTION) ×4 IMPLANT
PACK GENERAL/GYN (CUSTOM PROCEDURE TRAY) ×2 IMPLANT
PAD ARMBOARD 7.5X6 YLW CONV (MISCELLANEOUS) ×2 IMPLANT
PENCIL BUTTON HOLSTER BLD 10FT (ELECTRODE) ×2 IMPLANT
SPECIMEN JAR LARGE (MISCELLANEOUS) ×2 IMPLANT
SPONGE LAP 18X18 X RAY DECT (DISPOSABLE) ×4 IMPLANT
STAPLER CIRC CVD 29MM 37CM (STAPLE) ×2 IMPLANT
STAPLER CUT CVD 40MM BLUE (STAPLE) ×2 IMPLANT
STAPLER CUT RELOAD BLUE (STAPLE) ×2 IMPLANT
STAPLER VISISTAT 35W (STAPLE) ×2 IMPLANT
SUCTION POOLE TIP (SUCTIONS) ×2 IMPLANT
SURGILUBE 2OZ TUBE FLIPTOP (MISCELLANEOUS) ×2 IMPLANT
SUT PDS AB 1 TP1 96 (SUTURE) ×4 IMPLANT
SUT PROLENE 2 0 CT2 30 (SUTURE) IMPLANT
SUT PROLENE 2 0 KS (SUTURE) ×2 IMPLANT
SUT SILK 2 0 SH CR/8 (SUTURE) ×2 IMPLANT
SUT SILK 2 0 TIES 10X30 (SUTURE) ×2 IMPLANT
SUT SILK 2 0SH CR/8 30 (SUTURE) ×2 IMPLANT
SUT SILK 3 0 SH CR/8 (SUTURE) ×2 IMPLANT
SUT SILK 3 0 TIES 10X30 (SUTURE) ×2 IMPLANT
SUT VIC AB 3-0 SH 27 (SUTURE)
SUT VIC AB 3-0 SH 27X BRD (SUTURE) IMPLANT
SUT VIC AB 3-0 SH 8-18 (SUTURE) IMPLANT
SYR BULB IRRIGATION 50ML (SYRINGE) ×2 IMPLANT
TOWEL OR 17X26 10 PK STRL BLUE (TOWEL DISPOSABLE) ×4 IMPLANT
TOWEL OR NON WOVEN STRL DISP B (DISPOSABLE) ×4 IMPLANT
TRAY FOLEY CATH 14FRSI W/METER (CATHETERS) ×2 IMPLANT
TRAY PROCTOSCOPIC FIBER OPTIC (SET/KITS/TRAYS/PACK) ×2 IMPLANT
TUBE CONNECTING 12X1/4 (SUCTIONS) ×2 IMPLANT
UNDERPAD 30X30 INCONTINENT (UNDERPADS AND DIAPERS) ×2 IMPLANT
WATER STERILE IRR 1000ML POUR (IV SOLUTION) IMPLANT
YANKAUER SUCT BULB TIP NO VENT (SUCTIONS) ×2 IMPLANT

## 2013-11-07 NOTE — Anesthesia Preprocedure Evaluation (Signed)
Anesthesia Evaluation  Patient identified by MRN, date of birth, ID band Patient awake    Reviewed: Allergy & Precautions, H&P , NPO status , Patient's Chart, lab work & pertinent test results  Airway Mallampati: II TM Distance: >3 FB Neck ROM: Full    Dental  (+) Teeth Intact and Dental Advisory Given   Pulmonary Current Smoker,          Cardiovascular Rhythm:Regular Rate:Normal     Neuro/Psych Seizures -, Well Controlled,     GI/Hepatic   Endo/Other    Renal/GU      Musculoskeletal   Abdominal (+) + obese,   Peds  Hematology   Anesthesia Other Findings   Reproductive/Obstetrics                           Anesthesia Physical Anesthesia Plan  ASA: III  Anesthesia Plan: General   Post-op Pain Management:    Induction: Intravenous  Airway Management Planned: Oral ETT  Additional Equipment:   Intra-op Plan:   Post-operative Plan: Extubation in OR  Informed Consent: I have reviewed the patients History and Physical, chart, labs and discussed the procedure including the risks, benefits and alternatives for the proposed anesthesia with the patient or authorized representative who has indicated his/her understanding and acceptance.   Dental advisory given  Plan Discussed with: CRNA and Anesthesiologist  Anesthesia Plan Comments: (Recurrent diverticulitis Obesity H/O seizures controlled with lamictal  Plan GA with oral ETT  Kipp Brood, MD)        Anesthesia Quick Evaluation

## 2013-11-07 NOTE — Anesthesia Postprocedure Evaluation (Signed)
  Anesthesia Post-op Note  Patient: Rachel [name]  Procedure(s) Performed: Procedure(s): COLON RESECTION SIGMOID (N/A)  Patient Location: PACU  Anesthesia Type:General  Level of Consciousness: awake, alert  and oriented  Airway and Oxygen Therapy: Patient Spontanous Breathing and Patient connected to nasal cannula oxygen  Post-op Pain: mild  Post-op Assessment: Post-op Vital signs reviewed, Patient's Cardiovascular Status Stable, Respiratory Function Stable, Patent Airway and Pain level controlled  Post-op Vital Signs: stable  Complications: No apparent anesthesia complications

## 2013-11-07 NOTE — Interval H&P Note (Signed)
History and Physical Interval Note:  11/07/2013 2:50 PM  Rachel [name]  has presented today for surgery, with the diagnosis of Recurrent diverticulitis  The various methods of treatment have been discussed with the patient and family. After consideration of risks, benefits and other options for treatment, the patient has consented to  Procedure(s): COLON RESECTION SIGMOID (N/A) as a surgical intervention .  The patient's history has been reviewed, patient examined, no change in status, stable for surgery.  I have reviewed the patient's chart and labs.  Questions were answered to the patient's satisfaction.     [name] Patient has had multiple bouts of diverticulitis.  Sigmoid colectomy would benefit the patient.  The risks and benefits are understood and she wishes to proceed.

## 2013-11-07 NOTE — Transfer of Care (Signed)
Immediate Anesthesia Transfer of Care Note  Patient: Rachel [name] Situ  Procedure(s) Performed: Procedure(s): COLON RESECTION SIGMOID (N/A)  Patient Location: PACU  Anesthesia Type:General  Level of Consciousness: sedated  Airway & Oxygen Therapy: Patient Spontanous Breathing and Patient connected to face mask oxygen  Post-op Assessment: Report given to PACU RN and Post -op Vital signs reviewed and stable  Post vital signs: Reviewed and stable  Complications: No apparent anesthesia complications

## 2013-11-07 NOTE — H&P (View-Only) (Signed)
The patient is doing well currently but because she's had multiple bouts of diverticulitis we'll likely go ahead and have a sigmoid colectomy. This will be planned for the near future. She will get a 2 day GoLYTELY bowel prep along with oral antibiotics. The risks and benefits have been explained to the patient including the risk of anastomotic leakage and or disruption. Also because of her size she is at risk for wound infection.  Surgery will be planned for the future. 

## 2013-11-07 NOTE — Anesthesia Procedure Notes (Signed)
Procedure Name: Intubation Date/Time: 11/07/2013 12:02 PM Performed by: Armandina Gemma Pre-anesthesia Checklist: Patient identified, Timeout performed, Emergency Drugs available, Suction available and Patient being monitored Patient Re-evaluated:Patient Re-evaluated prior to inductionOxygen Delivery Method: Circle system utilized Preoxygenation: Pre-oxygenation with 100% oxygen Intubation Type: IV induction and Cricoid Pressure applied Ventilation: Mask ventilation without difficulty and Oral airway inserted - appropriate to patient size Laryngoscope Size: Hyacinth Meeker and 2 Grade View: Grade I Tube type: Oral Tube size: 7.0 mm Number of attempts: 1 Airway Equipment and Method: Stylet Placement Confirmation: ETT inserted through vocal cords under direct vision,  positive ETCO2 and breath sounds checked- equal and bilateral Secured at: 22 cm Tube secured with: Tape Dental Injury: Teeth and Oropharynx as per pre-operative assessment  Comments: IV induction Joslin- intubation AM CRNA atraumatic teeth and mouth as preop

## 2013-11-07 NOTE — Progress Notes (Signed)
Dr Noreene Larsson at bedside and has given 40 mcg of precedex

## 2013-11-07 NOTE — Preoperative (Signed)
Beta Blockers   Reason not to administer Beta Blockers:Not Applicable 

## 2013-11-07 NOTE — Op Note (Signed)
OPERATIVE REPORT  DATE OF OPERATION: 11/07/2013  PATIENT:  Rachel [name]  32 y.o. female  PRE-OPERATIVE DIAGNOSIS:  Recurrent diverticulitis  POST-OPERATIVE DIAGNOSIS:  Recurrent diverticulitis  PROCEDURE:  Procedure(s): COLON RESECTION SIGMOID  SURGEON:  Surgeon(s): [name], MD Velora Heckler, MD Liz Malady, MD  ASSISTANT: Thompson/Gerkin  ANESTHESIA:   general  EBL: 100 ml  BLOOD ADMINISTERED: none  DRAINS: Urinary Catheter (Foley)   SPECIMEN:  Source of Specimen:  Sigmoid colon  COUNTS CORRECT:  YES  PROCEDURE DETAILS: The patient was taken to the OR and placed on the table in the supine position.  After an adequate endotracheal anesthetic was administered, she was prepped and draped in the lithotomy position with Betadine and Chloroprep  After a proper timeout was performed identifying the patient and the procedure to be performed a midline incision was made in the infraumbilical area using a #10 blade .  Electrocautery was used to incise into the midline fascia.  We entered the peritoneal cavity and opened the fascia to the full extent of the skin incision.  A Balfour retractor was placed and the patient was placed in the Trendelenberg position.  The inflamed portion of the sigmoid colon was in the mid-sigmoid.    The descending colon was transected using a Blue cartridge Contour stapler.  We dissected the mesocolon and superior hemorrhoidal vessels ligating them with the LigaSure  Device.  We came across the distal sigmoid colon near the peritoneal reflection  Using the Contour stapler.  The proximal descending colon was mobilized by taking down the dolon at the lateral attachments of the line of Toldt.  We were able to mobilize it well enough to perform a tensionless End-to-end anastomosis with the Premium EEA stapler passed per rectum.  This was a 29mm size.  Once the anastomosis was completed a leak was demonstrated anterior ly using insufflation with the  sigmoidoscope.  This was repaired using 2-0 silk sutures, oversewing a mucosal lip off the distal anastomotic line.  Once this was done, no further leak was demonstrated.  We irrigated with several liters of saline.  We subsequently changed our gown and gloves.  We then closed with looped #1 PDS suture on the fascia and stainless steel staples on the skin.  Betadine ointment and a sterile dressing was applied.  All needle counts, sponge counts, and instrument counts were correct.  PATIENT DISPOSITION:  PACU - hemodynamically stable.   Jemia Fata O 11/25/20142:35 PM

## 2013-11-08 ENCOUNTER — Encounter (HOSPITAL_COMMUNITY): Payer: Self-pay | Admitting: General Surgery

## 2013-11-08 LAB — BASIC METABOLIC PANEL
BUN: 6 mg/dL (ref 6–23)
CO2: 25 mEq/L (ref 19–32)
Chloride: 102 mEq/L (ref 96–112)
Creatinine, Ser: 0.6 mg/dL (ref 0.50–1.10)
Potassium: 4 mEq/L (ref 3.5–5.1)

## 2013-11-08 LAB — CBC
HCT: 39.6 % (ref 36.0–46.0)
MCV: 87.6 fL (ref 78.0–100.0)
RDW: 13.5 % (ref 11.5–15.5)
WBC: 14.9 10*3/uL — ABNORMAL HIGH (ref 4.0–10.5)

## 2013-11-08 MED ORDER — SODIUM CHLORIDE 0.9 % IJ SOLN
10.0000 mL | INTRAMUSCULAR | Status: DC | PRN
  Administered 2013-11-08 – 2013-11-14 (×10): 10 mL

## 2013-11-08 MED ORDER — SODIUM CHLORIDE 0.9 % IV SOLN
500.0000 mL | Freq: Once | INTRAVENOUS | Status: AC
  Administered 2013-11-08: 500 mL via INTRAVENOUS

## 2013-11-08 MED ORDER — DIPHENHYDRAMINE HCL 50 MG/ML IJ SOLN
25.0000 mg | Freq: Four times a day (QID) | INTRAMUSCULAR | Status: DC | PRN
  Administered 2013-11-08 – 2013-11-09 (×6): 25 mg via INTRAVENOUS
  Administered 2013-11-10: 12.5 mg via INTRAVENOUS
  Administered 2013-11-10 – 2013-11-12 (×3): 25 mg via INTRAVENOUS
  Filled 2013-11-08 (×10): qty 1

## 2013-11-08 MED ORDER — ONDANSETRON HCL 4 MG/2ML IJ SOLN
2.0000 mg | Freq: Four times a day (QID) | INTRAMUSCULAR | Status: DC | PRN
  Administered 2013-11-08 – 2013-11-11 (×3): 4 mg via INTRAVENOUS
  Filled 2013-11-08 (×4): qty 2

## 2013-11-08 NOTE — Progress Notes (Signed)
Urine output was not that great.  Will bolus with saline.  Marta Lamas. Gae Bon, MD, FACS [phone] [facility] Surgery

## 2013-11-08 NOTE — Progress Notes (Signed)
Peripherally Inserted Central Catheter/Midline Placement  The IV Nurse has discussed with the patient and/or persons authorized to consent for the patient, the purpose of this procedure and the potential benefits and risks involved with this procedure.  The benefits include less needle sticks, lab draws from the catheter and patient may be discharged home with the catheter.  Risks include, but not limited to, infection, bleeding, blood clot (thrombus formation), and puncture of an artery; nerve damage and irregular heat beat.  Alternatives to this procedure were also discussed.  PICC/Midline Placement Documentation  PICC / Midline Double Lumen 11/08/13 PICC Right Cephalic 42 cm 4 cm (Active)  Indication for Insertion or Continuance of Line Chronic illness with exacerbations (CF, Sickle Cell, etc.) 11/08/2013 12:00 PM  Exposed Catheter (cm) 4 cm 11/08/2013 12:00 PM  Dressing Change Due 11/15/13 11/08/2013 12:00 PM       Stacie Glaze Horton 11/08/2013, 12:17 PM

## 2013-11-08 NOTE — Progress Notes (Signed)
Patient ID: Rachel [name], female   DOB: 05-31-1981, 32 y.o.   MRN: [id number] 1 Day Post-Op  Subjective: Pt c/o some pain this morning.  No nausea, tolerating clear liquids.  Objective: Vital signs in last 24 hours: Temp:  [97.7 F (36.5 C)-98.4 F (36.9 C)] 98.4 F (36.9 C) (11/26 0542) Pulse Rate:  [86-101] 98 (11/26 0542) Resp:  [15-37] 18 (11/26 0542) BP: (103-150)/(58-90) 127/63 mmHg (11/26 0542) SpO2:  [91 %-100 %] 100 % (11/26 0542) Weight:  [278 lb (126.1 kg)] 278 lb (126.1 kg) (11/25 1500) Last BM Date: 11/07/13  Intake/Output from previous day: 11/25 0701 - 11/26 0700 In: 2869 [P.O.:100; I.V.:2769] Out: 350 [Urine:200; Blood:150] Intake/Output this shift:    PE: Abd: soft, appropriately tender, incision c/d/i with honeycomb dressing in place, +BS, ND, obese Heart: regular Lungs: CTAB  Lab Results:   Recent Labs  11/08/13 0609  WBC 14.9*  HGB 12.9  HCT 39.6  PLT 282   BMET  Recent Labs  11/08/13 0609  NA 136  K 4.0  CL 102  CO2 25  GLUCOSE 108*  BUN 6  CREATININE 0.60  CALCIUM 8.4   PT/INR No results found for this basename: LABPROT, INR,  in the last 72 hours CMP     Component Value Date/Time   NA 136 11/08/2013 0609   K 4.0 11/08/2013 0609   CL 102 11/08/2013 0609   CO2 25 11/08/2013 0609   GLUCOSE 108* 11/08/2013 0609   BUN 6 11/08/2013 0609   CREATININE 0.60 11/08/2013 0609   CALCIUM 8.4 11/08/2013 0609   PROT 7.1 10/06/2013 2100   ALBUMIN 3.4* 10/06/2013 2100   AST 25 10/06/2013 2100   ALT 41* 10/06/2013 2100   ALKPHOS 79 10/06/2013 2100   BILITOT 0.2* 10/06/2013 2100   GFRNONAA >90 11/08/2013 0609   GFRAA >90 11/08/2013 0609   Lipase     Component Value Date/Time   LIPASE 35 10/06/2013 2100       Studies/Results: No results found.  Anti-infectives: Anti-infectives   Start     Dose/Rate Route Frequency Ordered Stop   11/07/13 1645  ceFAZolin (ANCEF) IVPB 2 g/50 mL premix     2 g 100 mL/hr over 30 Minutes  Intravenous 3 times per day 11/07/13 1638 11/07/13 1758   11/07/13 1638  metroNIDAZOLE (FLAGYL) IVPB 500 mg     500 mg 100 mL/hr over 60 Minutes Intravenous Every 8 hours 11/07/13 1638 11/07/13 1858   11/07/13 0600  ceFAZolin (ANCEF) 3 g in dextrose 5 % 50 mL IVPB     3 g 160 mL/hr over 30 Minutes Intravenous On call to O.R. 11/06/13 1411 11/07/13 1208   11/07/13 0600  metroNIDAZOLE (FLAGYL) IVPB 500 mg     500 mg 100 mL/hr over 60 Minutes Intravenous On call to O.R. 11/06/13 1411 11/07/13 1215       Assessment/Plan  1. POD1, sigmoid colectomy  Plan: 1. Place a PICC for better IV access 2. Cont clear liquids 3. Mobilize and pulm toilet 4.  No further abx therapy needed.  Cont entereg.  LOS: 1 day    Blair Lundeen E 11/08/2013, 9:37 AM Pager: [phone]

## 2013-11-09 LAB — BASIC METABOLIC PANEL
BUN: 3 mg/dL — ABNORMAL LOW (ref 6–23)
Calcium: 8.3 mg/dL — ABNORMAL LOW (ref 8.4–10.5)
Chloride: 103 mEq/L (ref 96–112)
Creatinine, Ser: 0.52 mg/dL (ref 0.50–1.10)
GFR calc Af Amer: >90 mL/min (ref >90)
GFR calc non Af Amer: >90 mL/min (ref >90)
Sodium: 137 mEq/L (ref 135–145)

## 2013-11-09 LAB — CBC WITH DIFFERENTIAL/PLATELET
Basophils Relative: 0 % (ref 0–1)
Eosinophils Absolute: 0.2 10*3/uL (ref 0.0–0.7)
Eosinophils Relative: 2 % (ref 0–5)
HCT: 37.4 % (ref 36.0–46.0)
Hemoglobin: 12.2 g/dL (ref 12.0–15.0)
MCH: 28.9 pg (ref 26.0–34.0)
MCHC: 32.6 g/dL (ref 30.0–36.0)
Monocytes Absolute: 1 10*3/uL (ref 0.1–1.0)
Monocytes Relative: 9 % (ref 3–12)
Neutro Abs: 7 10*3/uL (ref 1.7–7.7)
Neutrophils Relative %: 67 % (ref 43–77)
Platelets: 265 10*3/uL (ref 150–400)
RDW: 13.6 % (ref 11.5–15.5)

## 2013-11-09 MED ORDER — FENTANYL CITRATE 0.05 MG/ML IJ SOLN
25.0000 ug | INTRAMUSCULAR | Status: DC | PRN
  Administered 2013-11-09 – 2013-11-10 (×10): 25 ug via INTRAVENOUS
  Filled 2013-11-09 (×7): qty 2

## 2013-11-09 NOTE — Progress Notes (Signed)
2 Days Post-Op  Subjective: Stable and alert. Some nausea but no vomiting. Sipping on liquids. No stool or flatus.Lab work this morning looks fine.  Voiding well. Urine output has picked up nicely.  Slow to move. Poorly motivated to ambulate. Just moving around room a little bit.  Objective: Vital signs in last 24 hours: Temp:  [98.4 F (36.9 C)-99 F (37.2 C)] 98.4 F (36.9 C) (11/27 0516) Pulse Rate:  [98-111] 111 (11/27 0516) Resp:  [20] 20 (11/27 0516) BP: (110-126)/(56-70) 126/70 mmHg (11/27 0516) SpO2:  [93 %-100 %] 96 % (11/27 0516) Last BM Date: 11/07/13  Intake/Output from previous day: 11/26 0701 - 11/27 0700 In: 3921.8 [P.O.:600; I.V.:3321.8] Out: 2625 [Urine:2625] Intake/Output this shift:    General appearance: obese. Alert. Cooperative. Depressed affect. No distress. Resp: clear to auscultation bilaterally GI: obese. Soft. Hypoactive bowel sounds. Wound is clean.  Lab Results:  Results for orders placed during the hospital encounter of 11/07/13 (from the past 24 hour(s))  CBC WITH DIFFERENTIAL     Status: Abnormal   Collection Time    11/09/13  5:22 AM      Result Value Range   WBC 10.6 (*) 4.0 - 10.5 K/uL   RBC 4.22  3.87 - 5.11 MIL/uL   Hemoglobin 12.2  12.0 - 15.0 g/dL   HCT 40.9  81.1 - 91.4 %   MCV 88.6  78.0 - 100.0 fL   MCH 28.9  26.0 - 34.0 pg   MCHC 32.6  30.0 - 36.0 g/dL   RDW 78.2  95.6 - 21.3 %   Platelets 265  150 - 400 K/uL   Neutrophils Relative % 67  43 - 77 %   Neutro Abs 7.0  1.7 - 7.7 K/uL   Lymphocytes Relative 22  12 - 46 %   Lymphs Abs 2.3  0.7 - 4.0 K/uL   Monocytes Relative 9  3 - 12 %   Monocytes Absolute 1.0  0.1 - 1.0 K/uL   Eosinophils Relative 2  0 - 5 %   Eosinophils Absolute 0.2  0.0 - 0.7 K/uL   Basophils Relative 0  0 - 1 %   Basophils Absolute 0.0  0.0 - 0.1 K/uL  BASIC METABOLIC PANEL     Status: Abnormal   Collection Time    11/09/13  5:22 AM      Result Value Range   Sodium 137  135 - 145 mEq/L   Potassium  3.7  3.5 - 5.1 mEq/L   Chloride 103  96 - 112 mEq/L   CO2 27  19 - 32 mEq/L   Glucose, Bld 107 (*) 70 - 99 mg/dL   BUN 3 (*) 6 - 23 mg/dL   Creatinine, Ser 0.86  0.50 - 1.10 mg/dL   Calcium 8.3 (*) 8.4 - 10.5 mg/dL   GFR calc non Af Amer >90  >90 mL/min   GFR calc Af Amer >90  >90 mL/min     Studies/Results: @RISRSLT24 @  . alvimopan  12 mg Oral BID  . citalopram  40 mg Oral Daily  . enoxaparin (LOVENOX) injection  40 mg Subcutaneous Q12H  . lamoTRIgine  200 mg Oral TID  . levETIRAcetam  500 mg Oral QHS     Assessment/Plan: s/p Procedure(s): COLON RESECTION SIGMOID  POD#2. Stable. Expected ileus Continue entereg Clear liquids Switching to q1h. Fentanyl. She complains of breakthrough pain.  Mobilization, ambulation Hall, and pulmonary toilet strongly emphasized the patient.  Bipolar disorder  Tobacco abuse  Seizure  disorder. Continuing her meds.  @PROBHOSP @  LOS: 2 days    Merlyn Bollen M 11/09/2013  . .prob

## 2013-11-10 MED ORDER — FENTANYL 10 MCG/ML IV SOLN
INTRAVENOUS | Status: DC
  Administered 2013-11-10: 90 ug via INTRAVENOUS
  Administered 2013-11-10: 40 ug via INTRAVENOUS
  Administered 2013-11-10 – 2013-11-11 (×2): 90 ug via INTRAVENOUS
  Administered 2013-11-11: 115 ug via INTRAVENOUS
  Administered 2013-11-11: 100 ug via INTRAVENOUS
  Administered 2013-11-11: 40 ug via INTRAVENOUS
  Administered 2013-11-11: 180 ug via INTRAVENOUS
  Administered 2013-11-11: 07:00:00 via INTRAVENOUS
  Filled 2013-11-10 (×3): qty 50

## 2013-11-10 MED ORDER — DIPHENHYDRAMINE HCL 50 MG/ML IJ SOLN
12.5000 mg | Freq: Four times a day (QID) | INTRAMUSCULAR | Status: DC | PRN
  Administered 2013-11-10 – 2013-11-13 (×4): 12.5 mg via INTRAVENOUS
  Filled 2013-11-10 (×4): qty 1

## 2013-11-10 MED ORDER — DIPHENHYDRAMINE HCL 12.5 MG/5ML PO ELIX
12.5000 mg | ORAL_SOLUTION | Freq: Four times a day (QID) | ORAL | Status: DC | PRN
  Administered 2013-11-13 (×2): 12.5 mg via ORAL
  Filled 2013-11-10 (×2): qty 10

## 2013-11-10 MED ORDER — SODIUM CHLORIDE 0.9 % IJ SOLN: 9.0000 mL | INTRAMUSCULAR | Status: DC | PRN

## 2013-11-10 MED ORDER — NALOXONE HCL 0.4 MG/ML IJ SOLN: 0.4000 mg | INTRAMUSCULAR | Status: DC | PRN

## 2013-11-10 MED ORDER — ONDANSETRON HCL 4 MG/2ML IJ SOLN
4.0000 mg | Freq: Four times a day (QID) | INTRAMUSCULAR | Status: DC | PRN
  Administered 2013-11-11: 4 mg via INTRAVENOUS

## 2013-11-10 NOTE — Progress Notes (Addendum)
3 Days Post-Op  Subjective: Stable and alert. Has ambulated once. Still has pain and asking for PCA back. No stool or flatus. No vomiting. By mouth intake 1000 cc yesterday. Urine output excellent.  Objective: Vital signs in last 24 hours: Temp:  [98 F (36.7 C)-98.5 F (36.9 C)] 98 F (36.7 C) (11/28 0500) Pulse Rate:  [99-104] 99 (11/28 0500) Resp:  [18-20] 20 (11/28 0500) BP: (125-139)/(55-71) 139/71 mmHg (11/28 0500) SpO2:  [94 %-100 %] 100 % (11/28 0500) Last BM Date: 11/07/13  Intake/Output from previous day: 11/27 0701 - 11/28 0700 In: 4066.3 [P.O.:1120; I.V.:2946.3] Out: 1375 [Urine:1375] Intake/Output this shift:    EXAM: General appearance: obese. Alert. Cooperative. Depressed affect. Standing at bedside. Moaning loudly.  Resp: clear to auscultation bilaterally  GI: obese. Soft. Hypoactive bowel sounds. Wound is clean.   Lab Results:  No results found for this or any previous visit (from the past 24 hour(s)).   Studies/Results: @RISRSLT24 @  . alvimopan  12 mg Oral BID  . citalopram  40 mg Oral Daily  . enoxaparin (LOVENOX) injection  40 mg Subcutaneous Q12H  . lamoTRIgine  200 mg Oral TID  . levETIRAcetam  500 mg Oral QHS     Assessment/Plan: s/p Procedure(s): COLON RESECTION SIGMOID  POD#3. Stable.  Expected ileus  Continue entereg  Try full  liquids   Try fentanyl PCA reduced dose  Mobilization, ambulation Hall, and pulmonary toilet strongly emphasized the patient.   Bipolar disorder   Tobacco abuse   Seizure disorder. Continuing her meds.   @PROBHOSP @  LOS: 3 days    Rachel [name] 11/10/2013  . .prob

## 2013-11-11 MED ORDER — POLYETHYLENE GLYCOL 3350 17 G PO PACK
17.0000 g | PACK | Freq: Once | ORAL | Status: AC
  Administered 2013-11-11: 17 g via ORAL
  Filled 2013-11-11: qty 1

## 2013-11-11 MED ORDER — LORAZEPAM 0.5 MG PO TABS
0.5000 mg | ORAL_TABLET | Freq: Four times a day (QID) | ORAL | Status: DC | PRN
  Administered 2013-11-12: 0.5 mg via ORAL
  Filled 2013-11-11: qty 1

## 2013-11-11 MED ORDER — FENTANYL 10 MCG/ML IV SOLN
INTRAVENOUS | Status: DC
  Administered 2013-11-12: 80 ug via INTRAVENOUS
  Administered 2013-11-12: via INTRAVENOUS
  Administered 2013-11-12: 90 ug via INTRAVENOUS
  Filled 2013-11-11: qty 50

## 2013-11-11 NOTE — Progress Notes (Signed)
4 Days Post-Op  Subjective: Feeling better this morning. Ambulating more. Likes the low dose fentanyl PCA. Tolerating full liquid diet, but no stool or flatus. Says she is hungry.  Objective: Vital signs in last 24 hours: Temp:  [98 F (36.7 C)-98.7 F (37.1 C)] 98.1 F (36.7 C) (11/29 0609) Pulse Rate:  [79-102] 79 (11/29 0609) Resp:  [16-32] 32 (11/29 0609) BP: (121-127)/(54-67) 127/65 mmHg (11/29 0609) SpO2:  [95 %-98 %] 95 % (11/29 0609) Last BM Date: 11/07/13  Intake/Output from previous day: 11/28 0701 - 11/29 0700 In: 600 [P.O.:600] Out: 1950 [Urine:1950] Intake/Output this shift:    General appearance: alert. Cooperative. Seems much more comfortable. Spirits better. No distress. GI: abdomen is morbidly obese. Soft. Hypoactive bowel sounds. Wound is clean. Does not appear distended.  Lab Results:  No results found for this or any previous visit (from the past 24 hour(s)).   Studies/Results: @RISRSLT24 @  . alvimopan  12 mg Oral BID  . citalopram  40 mg Oral Daily  . enoxaparin (LOVENOX) injection  40 mg Subcutaneous Q12H  . fentaNYL   Intravenous Q4H  . lamoTRIgine  200 mg Oral TID  . levETIRAcetam  500 mg Oral QHS  . polyethylene glycol  17 g Oral Once     Assessment/Plan: s/p Procedure(s): COLON RESECTION SIGMOID   POD#3. Stable.  Expected ileus  Continue entereg  Continue full liquids until she has a bowel movement. MiraLAX, one dose Cont. fentanyl PCA reduced dose  Switch to po tomorrow Continue ambulation and pulmonary toilet,strongly emphasized  Bipolar disorder   Tobacco abuse   Seizure disorder. Continuing her meds   @PROBHOSP @  LOS: 4 days    Jameison Haji M 11/11/2013  . .prob

## 2013-11-12 MED ORDER — POLYETHYLENE GLYCOL 3350 17 G PO PACK
17.0000 g | PACK | Freq: Once | ORAL | Status: AC
  Administered 2013-11-12: 17 g via ORAL
  Filled 2013-11-12: qty 1

## 2013-11-12 MED ORDER — OXYCODONE-ACETAMINOPHEN 5-325 MG PO TABS
1.0000 | ORAL_TABLET | ORAL | Status: DC | PRN
  Administered 2013-11-12 – 2013-11-14 (×9): 2 via ORAL
  Filled 2013-11-12 (×9): qty 2

## 2013-11-12 NOTE — Progress Notes (Signed)
38 ml Fentanyl wasted in sink this am after PCA Fentanyl d/c'd.  Witnessed by Mechele Collin RN.

## 2013-11-12 NOTE — Progress Notes (Signed)
5 Days Post-Op  Subjective: She states that she feels fine and looks fine. Tolerating full liquid diet. Denies flatus or stool, and despite one dose of MiraLAX yesterday.Afebrile. Stable. Good urine output.  Objective: Vital signs in last 24 hours: Temp:  [97.9 F (36.6 C)-98.3 F (36.8 C)] 97.9 F (36.6 C) (11/30 0515) Pulse Rate:  [77-96] 78 (11/30 0515) Resp:  [14-32] 18 (11/30 0515) BP: (121-132)/(64-68) 121/68 mmHg (11/30 0515) SpO2:  [95 %-100 %] 96 % (11/30 0515) Last BM Date: 11/07/13  Intake/Output from previous day: 11/29 0701 - 11/30 0700 In: 500 [P.O.:480; I.V.:20] Out: 1600 [Urine:1600] Intake/Output this shift: Total I/O In: -  Out: 300 [Urine:300]  General appearance: alert. Cooperative. No distress. Seems comfortable. GI: morbidly obese. Soft. Not distended. Hypoactive bowel sounds. Wound clean.  Lab Results:  No results found for this or any previous visit (from the past 24 hour(s)).   Studies/Results: @RISRSLT24 @  . alvimopan  12 mg Oral BID  . citalopram  40 mg Oral Daily  . enoxaparin (LOVENOX) injection  40 mg Subcutaneous Q12H  . lamoTRIgine  200 mg Oral TID  . levETIRAcetam  500 mg Oral QHS  . polyethylene glycol  17 g Oral Once     Assessment/Plan: s/p Procedure(s): COLON RESECTION SIGMOID  POD#5. Stable.  Expected ileus  Continue entereg  Continue full liquids until she has a bowel movement.  MiraLAX, another dose  Discontinue PCA. There convert to Percocet by mouth. Discussed with patient who agrees. Continue ambulation and pulmonary toilet,strongly emphasized   Bipolar disorder  Tobacco abuse  Seizure disorder. Continuing her meds   @PROBHOSP @  LOS: 5 days    Shaquita Fort M 11/12/2013  . .prob

## 2013-11-13 MED ORDER — POLYETHYLENE GLYCOL 3350 17 G PO PACK
17.0000 g | PACK | Freq: Two times a day (BID) | ORAL | Status: DC
  Administered 2013-11-13 (×2): 17 g via ORAL
  Filled 2013-11-13 (×4): qty 1

## 2013-11-13 NOTE — Progress Notes (Signed)
Agree with A&P of MD,PA. She seems to be doing ok, just slow.

## 2013-11-13 NOTE — Progress Notes (Signed)
6 Days Post-Op  Subjective: Pt doing well, mildly nauseated because she feels bloated.  +flatus, but no BM yet despite miralax.  Tolerating full liquid diet well.  No concerns.  Ambulating well through the halls.  Using IS.  Objective: Vital signs in last 24 hours: Temp:  [97.6 F (36.4 C)-98.1 F (36.7 C)] 97.8 F (36.6 C) (12/01 0617) Pulse Rate:  [64-82] 64 (12/01 0646) Resp:  [17-19] 17 (12/01 0617) BP: (92-153)/(33-76) 102/60 mmHg (12/01 0646) SpO2:  [97 %-100 %] 100 % (12/01 0617) Last BM Date: 11/07/13  Intake/Output from previous day: 11/30 0701 - 12/01 0700 In: 600 [I.V.:600] Out: -  Intake/Output this shift:    PE: Gen:  Alert, NAD, pleasant Abd: Soft, mild tenderness, distended, +BS, no HSM, incisions C/D/I with staples in place in lower abdomen   Lab Results:  No results found for this basename: WBC, HGB, HCT, PLT,  in the last 72 hours BMET No results found for this basename: NA, K, CL, CO2, GLUCOSE, BUN, CREATININE, CALCIUM,  in the last 72 hours PT/INR No results found for this basename: LABPROT, INR,  in the last 72 hours CMP     Component Value Date/Time   NA 137 11/09/2013 0522   K 3.7 11/09/2013 0522   CL 103 11/09/2013 0522   CO2 27 11/09/2013 0522   GLUCOSE 107* 11/09/2013 0522   BUN 3* 11/09/2013 0522   CREATININE 0.52 11/09/2013 0522   CALCIUM 8.3* 11/09/2013 0522   PROT 7.1 10/06/2013 2100   ALBUMIN 3.4* 10/06/2013 2100   AST 25 10/06/2013 2100   ALT 41* 10/06/2013 2100   ALKPHOS 79 10/06/2013 2100   BILITOT 0.2* 10/06/2013 2100   GFRNONAA >90 11/09/2013 0522   GFRAA >90 11/09/2013 0522   Lipase     Component Value Date/Time   LIPASE 35 10/06/2013 2100       Studies/Results: No results found.  Anti-infectives: Anti-infectives   Start     Dose/Rate Route Frequency Ordered Stop   11/07/13 1645  ceFAZolin (ANCEF) IVPB 2 g/50 mL premix     2 g 100 mL/hr over 30 Minutes Intravenous 3 times per day 11/07/13 1638 11/07/13 1758   11/07/13 1638  metroNIDAZOLE (FLAGYL) IVPB 500 mg     500 mg 100 mL/hr over 60 Minutes Intravenous Every 8 hours 11/07/13 1638 11/07/13 1858   11/07/13 0600  ceFAZolin (ANCEF) 3 g in dextrose 5 % 50 mL IVPB     3 g 160 mL/hr over 30 Minutes Intravenous On call to O.R. 11/06/13 1411 11/07/13 1208   11/07/13 0600  metroNIDAZOLE (FLAGYL) IVPB 500 mg     500 mg 100 mL/hr over 60 Minutes Intravenous On call to O.R. 11/06/13 1411 11/07/13 1215       Assessment/Plan POD#5. S/p Sigmoid colon resection for diverticulitis - Stable.  Expected ileus  Continue entereg  Continue full liquids until she has a bowel movement - still waiting MiraLAX, another dose  PO percocet  Continue ambulation and pulmonary toilet, strongly emphasized  Lovenox and SCD's  Bipolar disorder  Tobacco abuse  Seizure disorder. Continuing her meds     LOS: 6 days    DORT, Aundra Millet 11/13/2013, 12:07 PM Pager: [phone]

## 2013-11-14 MED ORDER — OXYCODONE-ACETAMINOPHEN 5-325 MG PO TABS: 1.0000 | ORAL_TABLET | Freq: Four times a day (QID) | ORAL | Status: DC | PRN

## 2013-11-14 NOTE — Discharge Summary (Signed)
Physician Discharge Summary  Rachel [name] FAO:[id number] DOB: 15-Aug-1981 DOA: 11/07/2013  PCP: Reva Bores, MD  Consultation: None  Admit date: 11/07/2013 Discharge date: 11/14/2013  Recommendations for Outpatient Follow-up:  Follow-up Information   Follow up with [name], MD On 12/12/2013. (Please arrive at office at 9:45 am. )    Specialty:  General Surgery   Contact information:   [address] [place] [address]  [place] [address] [phone]       Follow up with [facility],MD, MD On 11/17/2013. (for staple removal. Please arrive to the office at 9:45 am. )    Specialty:  General Surgery   Contact information:   [address] [place] [address] [place] [address] [phone]        Discharge Diagnoses:  1. Diverticultis   Surgical Procedure: Colon resection sigmoid by Dr. Lindie Spruce  Discharge Condition: Stable Disposition: Home  Diet recommendation: Regular  Filed Weights   11/07/13 1500  Weight: 126.1 kg (278 lb)     HPI: Rachel [name] is a 32 year old female with a PMH of recurrent diverticulitis that was seen in the [facility] office by Dr. Lindie Spruce on 10/31/2013. She was scheduled for a sigmoid colectomy on 11/07/2013. She was directed to have a two day GoLYTELY bowel prep with oral antibiotics prior to surgery.   Hospital Course: A sigmoid colon resection was performed with an end-to-end anastomosis on 11/07/2013 by Dr. Lindie Spruce. Entereg was started prior to surgery and was maintained postoperatively. Her antibiotics were discontinued following surgery and a clear liquid diet was started on POD 1.  Her pain was controlled with a fentanyl PCA pump. She was able to tolerate clear liquids and was advanced to full liquids on POD 3. A bowel regimen was started on POD 4 after she was unable to have a bowel movement. She was able to have a bowel movement of POD 7 and was advanced to a regular diet.   Her vitals have been stable, pain well-controlled on po  meds and is able to ambulate without difficulty. She was discharged home in stable condition.   Physical Exam:  General appearance: alert and oriented. Calm and cooperative No acute distress. VSS. Afebrile.  Resp: clear to auscultation bilaterally  Cardio: S1S1 RRR without murmurs or gallops. No edema. GI: soft round and mild tenderness to RLQ only. +BS x4 quadrants. No organomegaly, hernias or masses. Midline staples C/D/I Pulses: +2 bilateral distal pulses without cyanosis  Neurologic: Mental status: Alert, oriented, thought content appropriate  Psychiatric: calm and cooperative   Discharge Instructions   Future Appointments Provider Department Dept Phone   11/21/2013 10:00 AM [name], MD [facility], [place] [phone]       Medication List         citalopram 40 MG tablet  Commonly known as:  CELEXA  Take 1 tablet (40 mg total) by mouth daily.     Cyanocobalamin 1500 MCG Tbdp  Take 1,500 mcg by mouth daily.     lamoTRIgine 200 MG tablet  Commonly known as:  LAMICTAL  Take 200 mg by mouth 3 (three) times daily.     levETIRAcetam 500 MG tablet  Commonly known as:  KEPPRA  Take 500 mg by mouth at bedtime.     oxyCODONE-acetaminophen 5-325 MG per tablet  Commonly known as:  PERCOCET/ROXICET  Take 1-2 tablets by mouth every 6 (six) hours as needed for moderate pain.  Labs: Basic Metabolic Panel:  Recent Labs Lab 11/08/13 0609 11/09/13 0522  NA 136 137  K 4.0 3.7  CL 102 103  CO2 25 27  GLUCOSE 108* 107*  BUN 6 3*  CREATININE 0.60 0.52  CALCIUM 8.4 8.3*   CBC:  Recent Labs Lab 11/08/13 0609 11/09/13 0522  WBC 14.9* 10.6*  NEUTROABS  --  7.0  HGB 12.9 12.2  HCT 39.6 37.4  MCV 87.6 88.6  PLT 282 265    Active Problems:   Diverticulitis    Signed: Maris Berger, PA-S

## 2013-11-14 NOTE — Progress Notes (Signed)
LOS: 7 days   Subjective: Patient is feeling "good." Mild RLQ pain to palpation since 5 days ago that is unchanged in nature. Pain medication is alleviating the pain. Tolerating her full diet well. No nausea or vomiting. Had small loose BM this morning. No melena or hematochezia. Ambulating in the halls and using IS.   Objective: Vital signs in last 24 hours: Temp:  [97.7 F (36.5 C)-98.3 F (36.8 C)] 97.7 F (36.5 C) (12/02 0543) Pulse Rate:  [76-80] 77 (12/02 0543) Resp:  [18-20] 20 (12/02 0543) BP: (124-130)/(63-70) 124/69 mmHg (12/02 0543) SpO2:  [96 %] 96 % (12/02 0543) Last BM Date: 11/07/13   Laboratory  CBC No results found for this basename: WBC, HGB, HCT, PLT,  in the last 72 hours BMET No results found for this basename: NA, K, CL, CO2, GLUCOSE, BUN, CREATININE, CALCIUM,  in the last 72 hours   Physical Exam General appearance: alert, cooperative and no distress Resp: clear to auscultation bilaterally Cardio: regular rate and rhythm GI: +BS, soft, mild tenderness to palpation of RLQ, no distension, no masses, midline incision with staples C/D/I Extremities: extremities normal, atraumatic, no cyanosis or edema   Assessment/Plan: POD#6. S/p Sigmoid colon resection for diverticulitis - Stable.  D/C entereg, patient had BM MiraLax, another dose Advance to regular diet  PO percocet prn Continue ambulation and pulmonary toilet Lovenox and SCD's  Bipolar disorder  Tobacco abuse  Seizure disorder. Continuing her meds  Dispo: anticipate discharge home today  Maris Berger, PA-S General Surgery 11/14/2013  ----------------------------------------------------------------------------------------------------------- General Surgery PA Preceptor Note:  I agree with the above PA students findings.  She should be ready for discharge after she has tolerated lunch.  Follow up to be arranged with Dr. Lindie Spruce and nursing visit for staple removal end of this week or next  week.    Aris [place], PA-C General Surgery [facility] Pager: [phone] Office: [phone]

## 2013-11-14 NOTE — Progress Notes (Signed)
Agree with A&P of MD,PA. Patient feels good and ready to go home. Aware of follow up plans with Dr Lindie Spruce

## 2013-11-14 NOTE — Progress Notes (Signed)
Discharge instructions reviewed with patient and patient's husband. Reviewed time of last dose of pain med. And reviewed verbal discussion with Dr. Jamey Ripa regarding no need for dressing unless discomfort from staples rubbing against clothing. 4x4s given just in case needed. Printed AVS and prescription given to patient. Patient discharged to home via wheelchair. Accompanied by husband

## 2013-11-17 ENCOUNTER — Encounter (INDEPENDENT_AMBULATORY_CARE_PROVIDER_SITE_OTHER): Payer: Medicare Other

## 2013-11-20 ENCOUNTER — Ambulatory Visit (INDEPENDENT_AMBULATORY_CARE_PROVIDER_SITE_OTHER): Payer: Medicare Other

## 2013-11-20 NOTE — Progress Notes (Unsigned)
Patient for nurse only; patient afebrile, incision intact no drainage, redness, odor noted. Cleansed area with chorea prep , removed 19 staples, cleansed with chorea prep, applied steri strip glue ,and steri streps. Patient states her lower abdomen was somewhat tender. Informed DR. Tsuei he advised she could be seen in [facility] today. Patient declined. Advised to call if her condition changed, fever 100.3 +, odor or drainage from incision. Patient verbalized understanding

## 2013-11-21 ENCOUNTER — Encounter (INDEPENDENT_AMBULATORY_CARE_PROVIDER_SITE_OTHER): Payer: Medicare Other | Admitting: General Surgery

## 2013-12-12 ENCOUNTER — Encounter (INDEPENDENT_AMBULATORY_CARE_PROVIDER_SITE_OTHER): Payer: Self-pay | Admitting: General Surgery

## 2013-12-12 ENCOUNTER — Ambulatory Visit (INDEPENDENT_AMBULATORY_CARE_PROVIDER_SITE_OTHER): Payer: Medicare Other | Admitting: General Surgery

## 2013-12-12 ENCOUNTER — Encounter (INDEPENDENT_AMBULATORY_CARE_PROVIDER_SITE_OTHER): Payer: Self-pay

## 2013-12-12 VITALS — BP 158/80 | HR 100 | Temp 98.6°F | Resp 15 | Ht 65.0 in | Wt 279.6 lb

## 2013-12-12 DIAGNOSIS — Z09 Encounter for follow-up examination after completed treatment for conditions other than malignant neoplasm: Secondary | ICD-10-CM

## 2013-12-12 NOTE — Progress Notes (Signed)
The patient is status post sigmoid colectomy for diverticulitis. She is doing well.  She is experiencing 5-10 bowel movements per day. She states that every time she needs to urinate she also needs to have a bowel movement.  On examination her midline wound has healed well with no evidence of infection. This firmness near the lower portion of the wound but no redness or tenderness.  She has good bowel sounds.  The patient is doing well status post sigmoid colectomy. The plan is for her to see me on an as-needed basis.

## 2014-08-06 ENCOUNTER — Emergency Department: Payer: Self-pay | Admitting: Emergency Medicine

## 2014-08-06 LAB — CBC
HCT: 43.1 % (ref 35.0–47.0)
HGB: 13.9 g/dL (ref 12.0–16.0)
MCH: 27.9 pg (ref 26.0–34.0)
MCHC: 32.2 g/dL (ref 32.0–36.0)
MCV: 87 fL (ref 80–100)
Platelet: 317 10*3/uL (ref 150–440)
RBC: 4.98 10*6/uL (ref 3.80–5.20)
RDW: 13.4 % (ref 11.5–14.5)
WBC: 11.1 10*3/uL — ABNORMAL HIGH (ref 3.6–11.0)

## 2014-08-06 LAB — DRUG SCREEN, URINE
Amphetamines, Ur Screen: NEGATIVE (ref ?–1000)
BARBITURATES, UR SCREEN: NEGATIVE (ref ?–200)
BENZODIAZEPINE, UR SCRN: NEGATIVE (ref ?–200)
COCAINE METABOLITE, UR ~~LOC~~: NEGATIVE (ref ?–300)
Cannabinoid 50 Ng, Ur ~~LOC~~: NEGATIVE (ref ?–50)
MDMA (ECSTASY) UR SCREEN: POSITIVE (ref ?–500)
METHADONE, UR SCREEN: NEGATIVE (ref ?–300)
Opiate, Ur Screen: NEGATIVE (ref ?–300)
PHENCYCLIDINE (PCP) UR S: NEGATIVE (ref ?–25)
Tricyclic, Ur Screen: NEGATIVE (ref ?–1000)

## 2014-08-06 LAB — COMPREHENSIVE METABOLIC PANEL
Albumin: 3.7 g/dL (ref 3.4–5.0)
Alkaline Phosphatase: 75 U/L
Anion Gap: 7 (ref 7–16)
BILIRUBIN TOTAL: 0.3 mg/dL (ref 0.2–1.0)
BUN: 5 mg/dL — ABNORMAL LOW (ref 7–18)
CALCIUM: 9.1 mg/dL (ref 8.5–10.1)
CHLORIDE: 105 mmol/L (ref 98–107)
CREATININE: 0.75 mg/dL (ref 0.60–1.30)
Co2: 27 mmol/L (ref 21–32)
EGFR (Non-African Amer.): >60
Glucose: 113 mg/dL — ABNORMAL HIGH (ref 65–99)
Osmolality: 276 (ref 275–301)
POTASSIUM: 4 mmol/L (ref 3.5–5.1)
SGOT(AST): 15 U/L (ref 15–37)
SGPT (ALT): 36 U/L
Sodium: 139 mmol/L (ref 136–145)
Total Protein: 7.2 g/dL (ref 6.4–8.2)

## 2014-08-06 LAB — URINALYSIS, COMPLETE
Bilirubin,UR: NEGATIVE
Blood: NEGATIVE
GLUCOSE, UR: NEGATIVE mg/dL (ref 0–75)
KETONE: NEGATIVE
Nitrite: NEGATIVE
PH: 6 (ref 4.5–8.0)
Protein: 30
SPECIFIC GRAVITY: 1.021 (ref 1.003–1.030)
WBC UR: 10 /HPF (ref 0–5)

## 2014-08-06 LAB — ETHANOL
Ethanol %: <0.003 % (ref 0.000–0.080)
Ethanol: <3 mg/dL

## 2014-09-04 ENCOUNTER — Ambulatory Visit (INDEPENDENT_AMBULATORY_CARE_PROVIDER_SITE_OTHER): Payer: Medicare Other | Admitting: Family Medicine

## 2014-09-04 VITALS — BP 116/78 | HR 89 | Ht 65.0 in | Wt 285.0 lb

## 2014-09-04 DIAGNOSIS — Z23 Encounter for immunization: Secondary | ICD-10-CM

## 2014-09-04 DIAGNOSIS — Z30433 Encounter for removal and reinsertion of intrauterine contraceptive device: Secondary | ICD-10-CM

## 2014-09-04 NOTE — Progress Notes (Signed)
      Subjective: Patient here for IUD change.  Has been in for 5 years and due to be changed. She has no cycles and desires to continue Mirena IUD for contraception.  Objective: Filed Vitals:   09/04/14 1333  BP: 116/78  Pulse: 89    Patient identified, informed consent performed, signed copy in chart, time out was performed Procedure: Speculum placed inside vagina.  Cervix visualized.  Strings grasped with ring forceps.  IUD removed intact.  Procedure: Cervix visualized.  Cleaned with Betadine x 2.  Grasped anteriourly with a single tooth tenaculum.  Uterus sounded to 8 cm.  Mirena IUD placed per manufacturer's recommendations.  Strings trimmed to 3 cm.   Patient given post procedure instructions and Mirena care card with expiration date.  Patient is asked to check IUD strings periodically and follow up in 4-6 weeks for IUD check.  Impression: Need for influenza vaccination - Plan: Flu Vaccine QUAD 36+ mos IM  Encounter for IUD removal and reinsertion   Plan: Continue IUD for 5 years.

## 2014-09-04 NOTE — Progress Notes (Signed)
Here today for IUD removal and reinsertion.

## 2014-09-04 NOTE — Patient Instructions (Signed)
Levonorgestrel intrauterine device (IUD) What is this medicine? LEVONORGESTREL IUD (LEE voe nor jes trel) is a contraceptive (birth control) device. The device is placed inside the uterus by a healthcare professional. It is used to prevent pregnancy and can also be used to treat heavy bleeding that occurs during your period. Depending on the device, it can be used for 3 to 5 years. This medicine may be used for other purposes; ask your health care provider or pharmacist if you have questions. COMMON BRAND NAME(S): LILETTA, Mirena, Skyla What should I tell my health care provider before I take this medicine? They need to know if you have any of these conditions: -abnormal Pap smear -cancer of the breast, uterus, or cervix -diabetes -endometritis -genital or pelvic infection now or in the past -have more than one sexual partner or your partner has more than one partner -heart disease -history of an ectopic or tubal pregnancy -immune system problems -IUD in place -liver disease or tumor -problems with blood clots or take blood-thinners -use intravenous drugs -uterus of unusual shape -vaginal bleeding that has not been explained -an unusual or allergic reaction to levonorgestrel, other hormones, silicone, or polyethylene, medicines, foods, dyes, or preservatives -pregnant or trying to get pregnant -breast-feeding How should I use this medicine? This device is placed inside the uterus by a health care professional. Talk to your pediatrician regarding the use of this medicine in children. Special care may be needed. Overdosage: If you think you have taken too much of this medicine contact a poison control center or emergency room at once. NOTE: This medicine is only for you. Do not share this medicine with others. What if I miss a dose? This does not apply. What may interact with this medicine? Do not take this medicine with any of the following  medications: -amprenavir -bosentan -fosamprenavir This medicine may also interact with the following medications: -aprepitant -barbiturate medicines for inducing sleep or treating seizures -bexarotene -griseofulvin -medicines to treat seizures like carbamazepine, ethotoin, felbamate, oxcarbazepine, phenytoin, topiramate -modafinil -pioglitazone -rifabutin -rifampin -rifapentine -some medicines to treat HIV infection like atazanavir, indinavir, lopinavir, nelfinavir, tipranavir, ritonavir -St. John's wort -warfarin This list may not describe all possible interactions. Give your health care provider a list of all the medicines, herbs, non-prescription drugs, or dietary supplements you use. Also tell them if you smoke, drink alcohol, or use illegal drugs. Some items may interact with your medicine. What should I watch for while using this medicine? Visit your doctor or health care professional for regular check ups. See your doctor if you or your partner has sexual contact with others, becomes HIV positive, or gets a sexual transmitted disease. This product does not protect you against HIV infection (AIDS) or other sexually transmitted diseases. You can check the placement of the IUD yourself by reaching up to the top of your vagina with clean fingers to feel the threads. Do not pull on the threads. It is a good habit to check placement after each menstrual period. Call your doctor right away if you feel more of the IUD than just the threads or if you cannot feel the threads at all. The IUD may come out by itself. You may become pregnant if the device comes out. If you notice that the IUD has come out use a backup birth control method like condoms and call your health care provider. Using tampons will not change the position of the IUD and are okay to use during your period. What side effects may   I notice from receiving this medicine? Side effects that you should report to your doctor or  health care professional as soon as possible: -allergic reactions like skin rash, itching or hives, swelling of the face, lips, or tongue -fever, flu-like symptoms -genital sores -high blood pressure -no menstrual period for 6 weeks during use -pain, swelling, warmth in the leg -pelvic pain or tenderness -severe or sudden headache -signs of pregnancy -stomach cramping -sudden shortness of breath -trouble with balance, talking, or walking -unusual vaginal bleeding, discharge -yellowing of the eyes or skin Side effects that usually do not require medical attention (report to your doctor or health care professional if they continue or are bothersome): -acne -breast pain -change in sex drive or performance -changes in weight -cramping, dizziness, or faintness while the device is being inserted -headache -irregular menstrual bleeding within first 3 to 6 months of use -nausea This list may not describe all possible side effects. Call your doctor for medical advice about side effects. You may report side effects to FDA at 1-800-FDA-1088. Where should I keep my medicine? This does not apply. NOTE: This sheet is a summary. It may not cover all possible information. If you have questions about this medicine, talk to your doctor, pharmacist, or health care provider.  2015, Elsevier/Gold Standard. (2011-12-31 13:54:04)  

## 2014-10-15 ENCOUNTER — Encounter (INDEPENDENT_AMBULATORY_CARE_PROVIDER_SITE_OTHER): Payer: Self-pay | Admitting: General Surgery

## 2014-11-29 IMAGING — CT CT ABD-PELV W/ CM
2 of 4 series · 14 of 32 positions shown, 19 images · IV contrast (omnipaque)
Comparison: CT abdomen 08/19/2006

CLINICAL DATA: Lower abdominal pain and fever

CT ABDOMEN AND PELVIS WITH CONTRAST
TECHNIQUE: Multidetector CT imaging of the abdomen and pelvis was
performed following the standard protocol during bolus
administration of intravenous contrast.
Contrast: 100mL OMNIPAQUE IOHEXOL 300 MG/ML  SOLN

[Series 2: routine abdomen · axial · 0.97mm/px · z∈[-449,-79]mm · 6 of 104 slices shown, 11 images]
[im 15/104  soft-tissue]
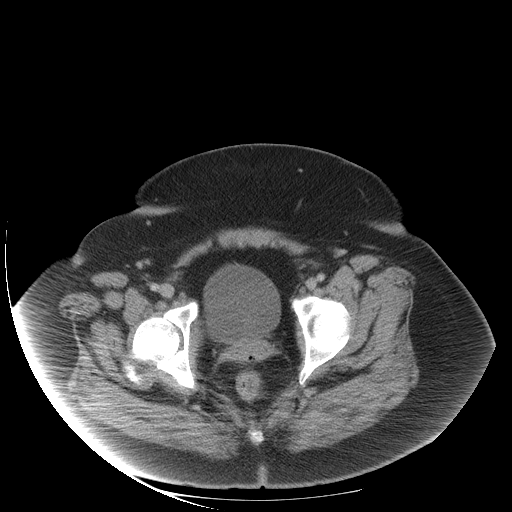
[im 15/104  bone]
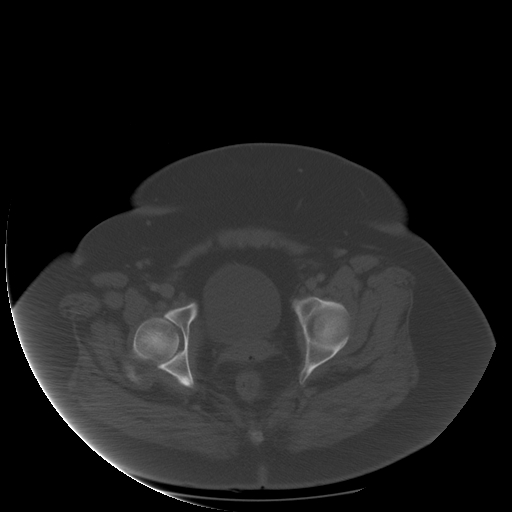
[im 30/104  soft-tissue]
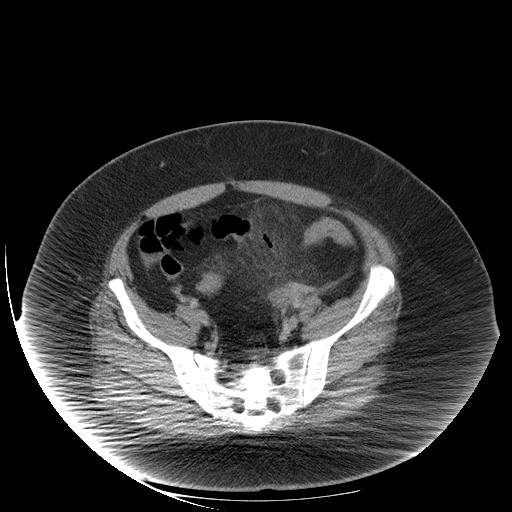
[im 45/104  soft-tissue]
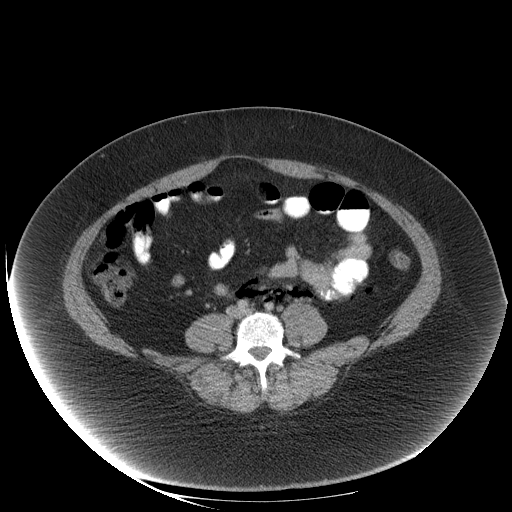
[im 45/104  lung]
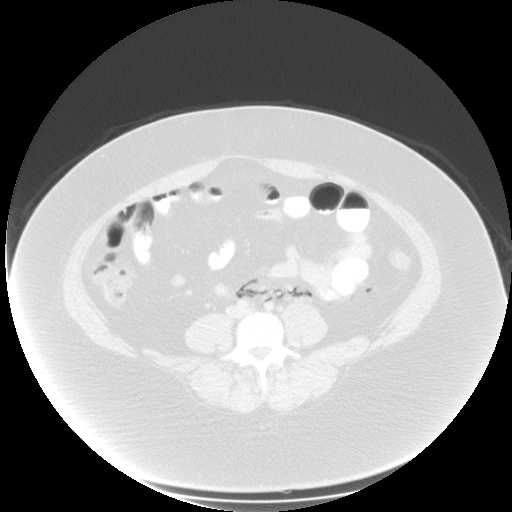
[im 59/104  soft-tissue]
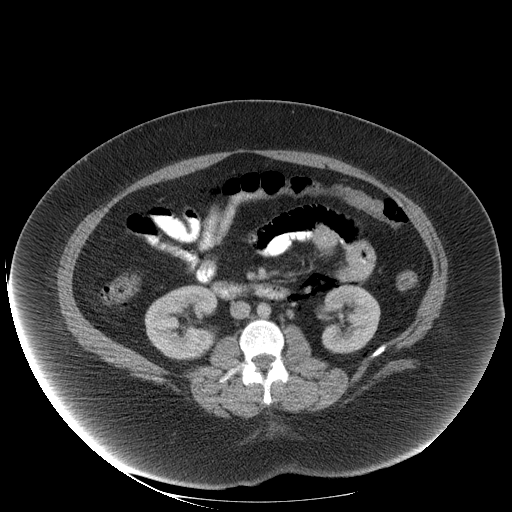
[im 59/104  lung]
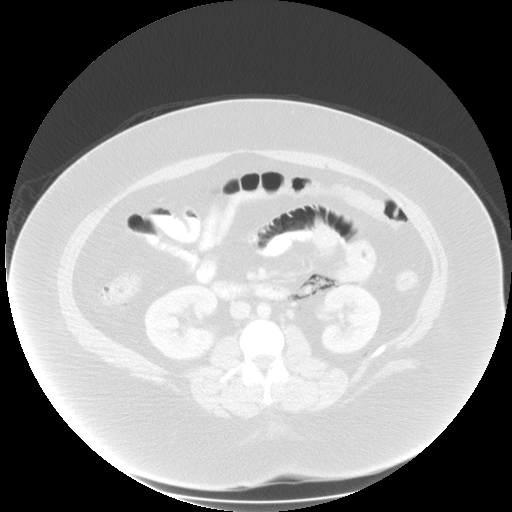
[im 74/104  soft-tissue]
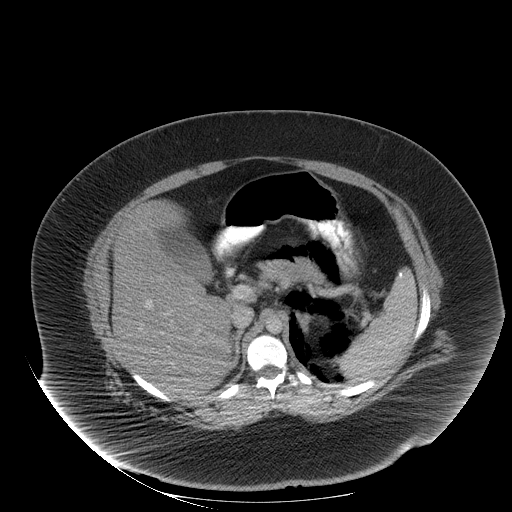
[im 74/104  lung]
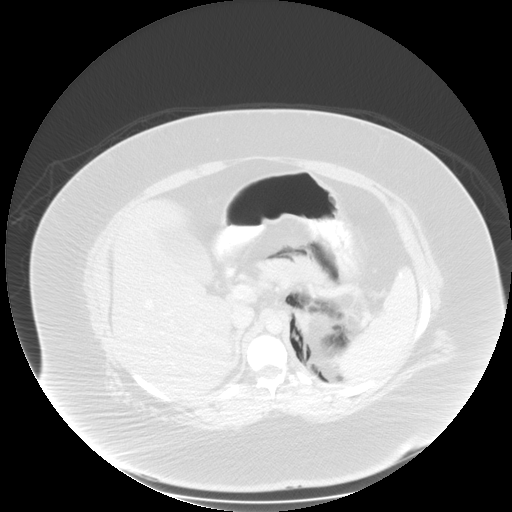
[im 89/104  soft-tissue]
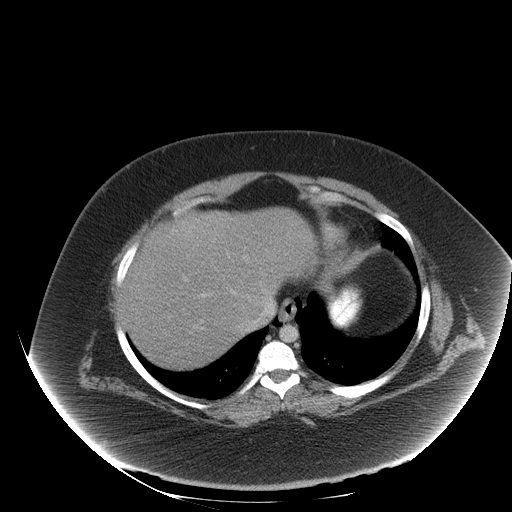
[im 89/104  lung]
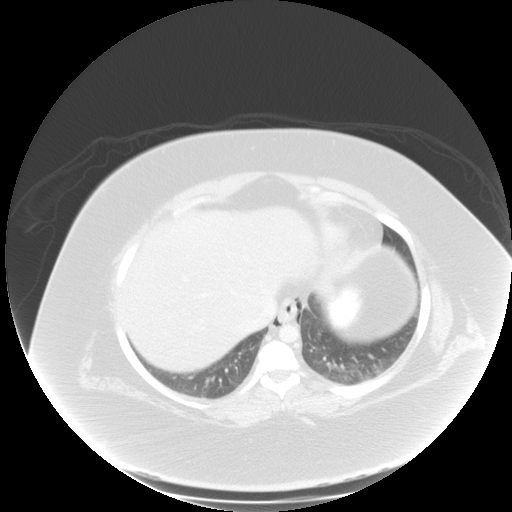

[Series 401: sagittal · sagittal · 1.03mm/px · 8 of 149 slices shown]
[im 14/149  soft-tissue]
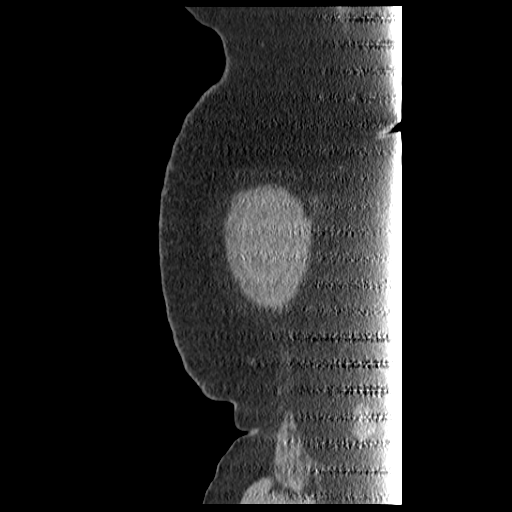
[im 27/149  soft-tissue]
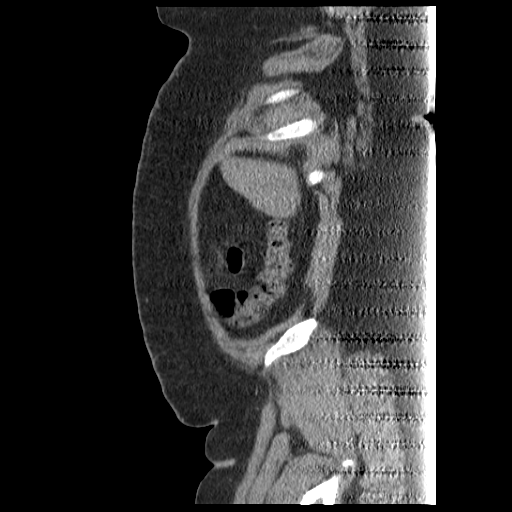
[im 54/149  soft-tissue]
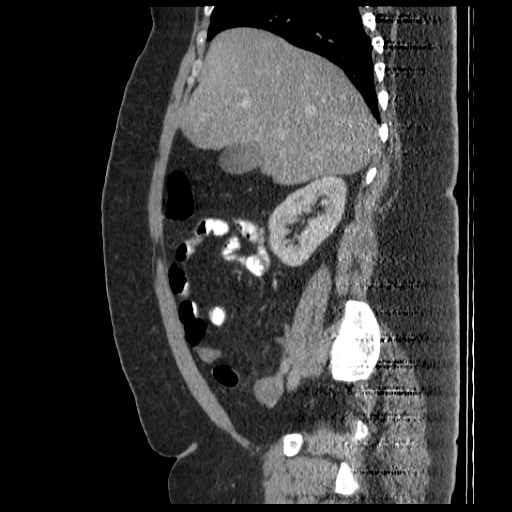
[im 68/149  soft-tissue]
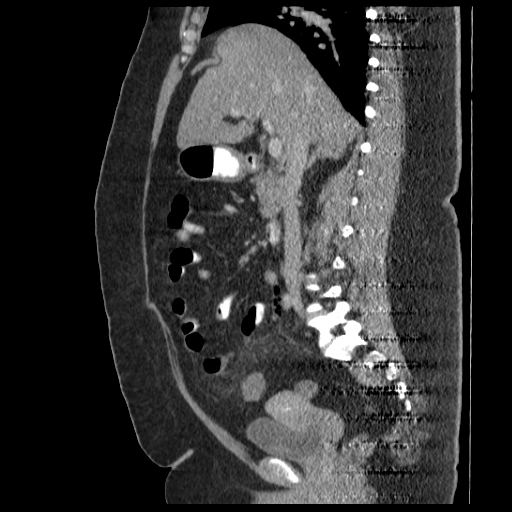
[im 81/149  soft-tissue]
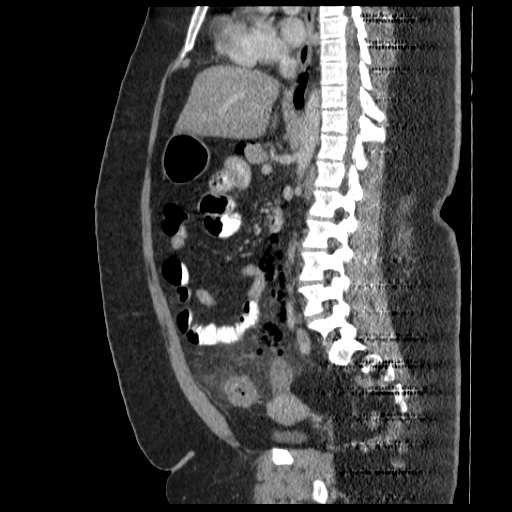
[im 95/149  soft-tissue]
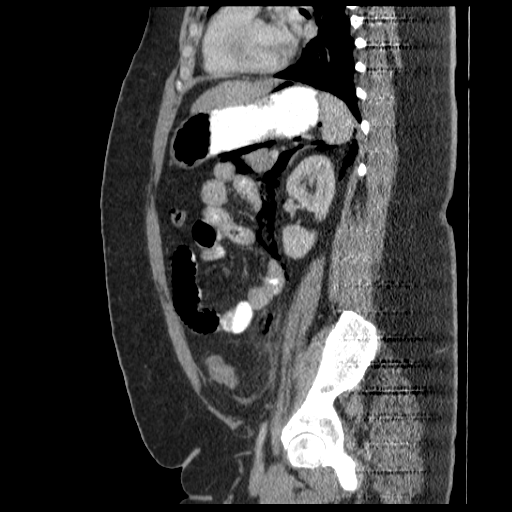
[im 122/149  soft-tissue]
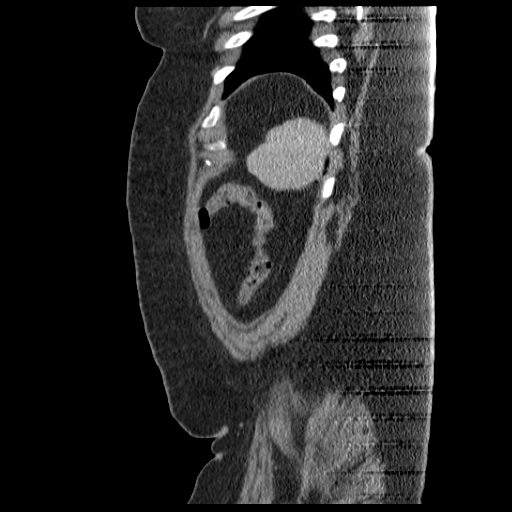
[im 135/149  soft-tissue]
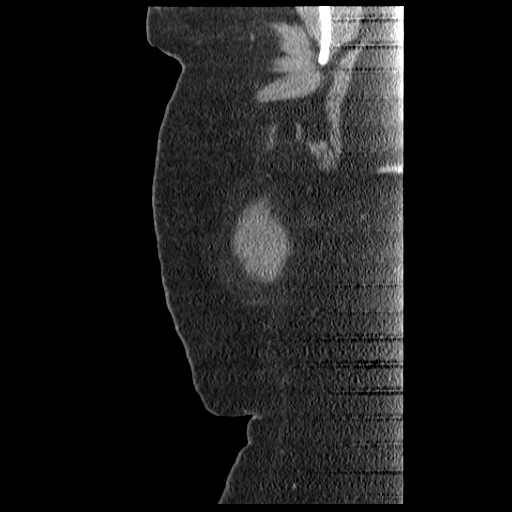

[14 of 32 positions shown; findings below may reference images not displayed]

FINDINGS: Lung bases are clear.  There is no pericardial fluid.
There is gas surrounding the distal esophagus at the GE junction.
The gas tracks inferiorly along the left retroperitoneal space to
the level of the sigmoid colon. The transverse portion of the
sigmoid colon is inflamed consistent with acute diverticulitis.

There is there is mild pericolonic stranding at t level of the
transverse portion the sigmoid colon.  There are several
diverticula through this region.

There is a gas tract evident on the sagittal projection along the
mesenteric border (image 78).  This is consistent with a
perforation of the sigmoid colon with tracking of the gas within
the retroperitoneum extending to the GE junction.  The pancreas is
elevated by this dissecting gas collection also which also
surrounds upper kidney.

The liver, gallbladder, pancreas, spleen, adrenal glands, and
kidneys are normal.

Abdominal aorta is normal in caliber.  There is no free fluid
pelvis the uterus and ovaries are normal.  The bladder is normal.
IUD in expected location within the uterus.
IMPRESSION: 1.  Acute sigmoid diverticulitis with perforation.
2.  Perforation recurrence along mesenteric border of the
transverse sigmoid colon and dissects into the retroperitoneum to
the level of the GE junction.
3.  No evidence of abscess formation.

Findings discussed with  Dr. Ljesar on 06/04/2013 at 0111 hours

## 2017-01-20 ENCOUNTER — Encounter
Admission: RE | Admit: 2017-01-20 | Discharge: 2017-01-20 | Disposition: A | Discharge status: Home / Self Care | Payer: Medicare Other | Source: Ambulatory Visit | Attending: Unknown Physician Specialty | Admitting: Unknown Physician Specialty

## 2017-01-20 DIAGNOSIS — Z01812 Encounter for preprocedural laboratory examination: Secondary | ICD-10-CM | POA: Insufficient documentation

## 2017-01-20 DIAGNOSIS — Z79899 Other long term (current) drug therapy: Secondary | ICD-10-CM | POA: Diagnosis not present

## 2017-01-20 DIAGNOSIS — E785 Hyperlipidemia, unspecified: Secondary | ICD-10-CM | POA: Insufficient documentation

## 2017-01-20 DIAGNOSIS — Z888 Allergy status to other drugs, medicaments and biological substances status: Secondary | ICD-10-CM | POA: Diagnosis not present

## 2017-01-20 DIAGNOSIS — F319 Bipolar disorder, unspecified: Secondary | ICD-10-CM | POA: Diagnosis not present

## 2017-01-20 DIAGNOSIS — K5790 Diverticulosis of intestine, part unspecified, without perforation or abscess without bleeding: Secondary | ICD-10-CM | POA: Diagnosis not present

## 2017-01-20 DIAGNOSIS — G5601 Carpal tunnel syndrome, right upper limb: Secondary | ICD-10-CM | POA: Diagnosis not present

## 2017-01-20 DIAGNOSIS — Z820 Family history of epilepsy and other diseases of the nervous system: Secondary | ICD-10-CM | POA: Diagnosis not present

## 2017-01-20 DIAGNOSIS — Z8249 Family history of ischemic heart disease and other diseases of the circulatory system: Secondary | ICD-10-CM | POA: Insufficient documentation

## 2017-01-20 DIAGNOSIS — Z01810 Encounter for preprocedural cardiovascular examination: Secondary | ICD-10-CM | POA: Insufficient documentation

## 2017-01-20 DIAGNOSIS — Z9889 Other specified postprocedural states: Secondary | ICD-10-CM | POA: Diagnosis not present

## 2017-01-20 DIAGNOSIS — E119 Type 2 diabetes mellitus without complications: Secondary | ICD-10-CM | POA: Diagnosis not present

## 2017-01-20 HISTORY — DX: Type 2 diabetes mellitus without complications: E11.9

## 2017-01-20 HISTORY — DX: Bipolar disorder, unspecified: F31.9

## 2017-01-20 LAB — BASIC METABOLIC PANEL
Anion gap: 9 (ref 5–15)
BUN: 6 mg/dL (ref 6–20)
CO2: 24 mmol/L (ref 22–32)
CREATININE: 0.63 mg/dL (ref 0.44–1.00)
Calcium: 9 mg/dL (ref 8.9–10.3)
Chloride: 103 mmol/L (ref 101–111)
GFR calc Af Amer: >60 mL/min (ref >60)
Glucose, Bld: 83 mg/dL (ref 65–99)
Potassium: 3.8 mmol/L (ref 3.5–5.1)
SODIUM: 136 mmol/L (ref 135–145)

## 2017-01-20 LAB — SURGICAL PCR SCREEN
MRSA, PCR: NEGATIVE
STAPHYLOCOCCUS AUREUS: POSITIVE — AB

## 2017-01-20 NOTE — Patient Instructions (Signed)
  Your procedure is scheduled on:01/27/17 Report to Day Surgery. [facility]OND FLOOR To find out your arrival time please call [phone] between 1PM - 3PM on 01/26/17.  Remember: Instructions that are not followed completely may result in serious medical risk, up to and including death, or upon the discretion of your surgeon and anesthesiologist your surgery may need to be rescheduled.    __X__ 1. Do not eat food or drink liquids after midnight. No gum chewing or hard candies.     ____ 2. No Alcohol for 24 hours before or after surgery.   __X__ 3. Do Not Smoke For 24 Hours Prior to Your Surgery.   ____ 4. Bring all medications with you on the day of surgery if instructed.    X____ 5. Notify your doctor if there is any change in your medical condition     (cold, fever, infections).       Do not wear jewelry, make-up, hairpins, clips or nail polish.  Do not wear lotions, powders, or perfumes. You may wear deodorant.  Do not shave 48 hours prior to surgery. Men may shave face and neck.  Do not bring valuables to the hospital.    [facility] belongings or valuables.               Contacts, dentures or bridgework may not be worn into surgery.  Leave your suitcase in the car. After surgery it may be brought to your room.  For patients admitted to the hospital, discharge time is determined by your                treatment team.   Patients discharged the day of surgery will not be allowed to drive home.   Please read over the following fact sheets that you were given:   MRSA Information   __X__ Take these medicines the morning of surgery with A SIP OF WATER:    1. LAMICTAL  2.   3.   4.  5.  6.  ____ Fleet Enema (as directed)   __X__ Use CHG Soap as directed  ____ Use inhalers on the day of surgery  ____ Stop metformin 2 days prior to surgery    ____ Take 1/2 of usual insulin dose the night before surgery and none on the morning of  surgery.   ____ Stop Coumadin/Plavix/aspirin on   __X__ Stop Anti-inflammatories on     STOP ALEAVE UNTIL AFTER SURGERY  ____ Stop supplements until after surgery.    _X___ Bring C-Pap to the hospital.

## 2017-01-26 NOTE — Pre-Procedure Instructions (Signed)
[facility] that Dr Monica MartinezHBK had started pt on mupirocin ointment per + staph nasal swab-Dr HBK did not want to add antibiotic per Velna HatchetSheila

## 2017-01-27 ENCOUNTER — Ambulatory Visit: Payer: Medicare Other | Admitting: Anesthesiology

## 2017-01-27 ENCOUNTER — Encounter
Admission: RE | Disposition: A | Discharge status: Home / Self Care | Payer: Self-pay | Source: Ambulatory Visit | Attending: Unknown Physician Specialty

## 2017-01-27 ENCOUNTER — Ambulatory Visit
Admission: RE | Admit: 2017-01-27 | Discharge: 2017-01-27 | Disposition: A | Discharge status: Home / Self Care | Payer: Medicare Other | Source: Ambulatory Visit | Attending: Unknown Physician Specialty | Admitting: Unknown Physician Specialty

## 2017-01-27 ENCOUNTER — Encounter: Payer: Self-pay | Admitting: *Deleted

## 2017-01-27 DIAGNOSIS — Z8349 Family history of other endocrine, nutritional and metabolic diseases: Secondary | ICD-10-CM | POA: Insufficient documentation

## 2017-01-27 DIAGNOSIS — Z6841 Body Mass Index (BMI) 40.0 and over, adult: Secondary | ICD-10-CM | POA: Diagnosis not present

## 2017-01-27 DIAGNOSIS — Z9889 Other specified postprocedural states: Secondary | ICD-10-CM | POA: Diagnosis not present

## 2017-01-27 DIAGNOSIS — F319 Bipolar disorder, unspecified: Secondary | ICD-10-CM | POA: Diagnosis not present

## 2017-01-27 DIAGNOSIS — E119 Type 2 diabetes mellitus without complications: Secondary | ICD-10-CM | POA: Insufficient documentation

## 2017-01-27 DIAGNOSIS — Z79899 Other long term (current) drug therapy: Secondary | ICD-10-CM | POA: Diagnosis not present

## 2017-01-27 DIAGNOSIS — E669 Obesity, unspecified: Secondary | ICD-10-CM | POA: Diagnosis not present

## 2017-01-27 DIAGNOSIS — Z8249 Family history of ischemic heart disease and other diseases of the circulatory system: Secondary | ICD-10-CM | POA: Diagnosis not present

## 2017-01-27 DIAGNOSIS — F17200 Nicotine dependence, unspecified, uncomplicated: Secondary | ICD-10-CM | POA: Insufficient documentation

## 2017-01-27 DIAGNOSIS — Z820 Family history of epilepsy and other diseases of the nervous system: Secondary | ICD-10-CM | POA: Diagnosis not present

## 2017-01-27 DIAGNOSIS — Z9049 Acquired absence of other specified parts of digestive tract: Secondary | ICD-10-CM | POA: Diagnosis not present

## 2017-01-27 DIAGNOSIS — R569 Unspecified convulsions: Secondary | ICD-10-CM | POA: Diagnosis not present

## 2017-01-27 DIAGNOSIS — Z7984 Long term (current) use of oral hypoglycemic drugs: Secondary | ICD-10-CM | POA: Diagnosis not present

## 2017-01-27 DIAGNOSIS — E785 Hyperlipidemia, unspecified: Secondary | ICD-10-CM | POA: Insufficient documentation

## 2017-01-27 DIAGNOSIS — G5601 Carpal tunnel syndrome, right upper limb: Secondary | ICD-10-CM | POA: Insufficient documentation

## 2017-01-27 HISTORY — PX: CARPAL TUNNEL RELEASE: SHX101

## 2017-01-27 LAB — URINE DRUG SCREEN, QUALITATIVE (ARMC ONLY)
AMPHETAMINES, UR SCREEN: NOT DETECTED
BARBITURATES, UR SCREEN: NOT DETECTED
BENZODIAZEPINE, UR SCRN: NOT DETECTED
Cannabinoid 50 Ng, Ur ~~LOC~~: NOT DETECTED
Cocaine Metabolite,Ur ~~LOC~~: NOT DETECTED
MDMA (Ecstasy)Ur Screen: NOT DETECTED
METHADONE SCREEN, URINE: NOT DETECTED
OPIATE, UR SCREEN: NOT DETECTED
Phencyclidine (PCP) Ur S: NOT DETECTED
TRICYCLIC, UR SCREEN: NOT DETECTED

## 2017-01-27 LAB — GLUCOSE, CAPILLARY
Glucose-Capillary: 96 mg/dL (ref 65–99)
Glucose-Capillary: 104 mg/dL — ABNORMAL HIGH (ref 65–99)

## 2017-01-27 LAB — POCT PREGNANCY, URINE: PREG TEST UR: NEGATIVE

## 2017-01-27 SURGERY — CARPAL TUNNEL RELEASE
Anesthesia: General | Laterality: Right

## 2017-01-27 MED ORDER — LIDOCAINE HCL (CARDIAC) 20 MG/ML IV SOLN
INTRAVENOUS | Status: DC | PRN
  Administered 2017-01-27: 100 mg via INTRAVENOUS

## 2017-01-27 MED ORDER — PROPOFOL 10 MG/ML IV BOLUS
INTRAVENOUS | Status: AC
  Filled 2017-01-27: qty 20

## 2017-01-27 MED ORDER — GLYCOPYRROLATE 0.2 MG/ML IJ SOLN
INTRAMUSCULAR | Status: AC
  Filled 2017-01-27: qty 1

## 2017-01-27 MED ORDER — SODIUM CHLORIDE 0.9 % IV SOLN
INTRAVENOUS | Status: DC
  Administered 2017-01-27: 75 mL/h via INTRAVENOUS

## 2017-01-27 MED ORDER — FENTANYL CITRATE (PF) 100 MCG/2ML IJ SOLN
INTRAMUSCULAR | Status: AC
  Filled 2017-01-27: qty 2

## 2017-01-27 MED ORDER — DEXAMETHASONE SODIUM PHOSPHATE 10 MG/ML IJ SOLN
INTRAMUSCULAR | Status: AC
  Filled 2017-01-27: qty 1

## 2017-01-27 MED ORDER — LIDOCAINE HCL (PF) 2 % IJ SOLN
INTRAMUSCULAR | Status: AC
  Filled 2017-01-27: qty 2

## 2017-01-27 MED ORDER — ONDANSETRON HCL 4 MG/2ML IJ SOLN
INTRAMUSCULAR | Status: DC | PRN
  Administered 2017-01-27: 4 mg via INTRAVENOUS

## 2017-01-27 MED ORDER — IPRATROPIUM-ALBUTEROL 0.5-2.5 (3) MG/3ML IN SOLN
RESPIRATORY_TRACT | Status: AC
  Administered 2017-01-27: 3 mL via RESPIRATORY_TRACT
  Filled 2017-01-27: qty 3

## 2017-01-27 MED ORDER — PHENYLEPHRINE HCL 10 MG/ML IJ SOLN
INTRAMUSCULAR | Status: DC | PRN
  Administered 2017-01-27 (×2): 100 ug via INTRAVENOUS

## 2017-01-27 MED ORDER — FAMOTIDINE 20 MG PO TABS
20.0000 mg | ORAL_TABLET | Freq: Once | ORAL | Status: AC
  Administered 2017-01-27: 20 mg via ORAL

## 2017-01-27 MED ORDER — FAMOTIDINE 20 MG PO TABS
ORAL_TABLET | ORAL | Status: AC
  Administered 2017-01-27: 20 mg via ORAL
  Filled 2017-01-27: qty 1

## 2017-01-27 MED ORDER — FENTANYL CITRATE (PF) 100 MCG/2ML IJ SOLN
25.0000 ug | INTRAMUSCULAR | Status: DC | PRN
  Administered 2017-01-27: 25 ug via INTRAVENOUS

## 2017-01-27 MED ORDER — MIDAZOLAM HCL 2 MG/2ML IJ SOLN
INTRAMUSCULAR | Status: DC | PRN
  Administered 2017-01-27: 2 mg via INTRAVENOUS

## 2017-01-27 MED ORDER — ONDANSETRON HCL 4 MG/2ML IJ SOLN
INTRAMUSCULAR | Status: AC
  Filled 2017-01-27: qty 2

## 2017-01-27 MED ORDER — PROPOFOL 10 MG/ML IV BOLUS
INTRAVENOUS | Status: DC | PRN
  Administered 2017-01-27: 40 mg via INTRAVENOUS
  Administered 2017-01-27: 200 mg via INTRAVENOUS

## 2017-01-27 MED ORDER — BUPIVACAINE HCL (PF) 0.5 % IJ SOLN
INTRAMUSCULAR | Status: DC | PRN
  Administered 2017-01-27: 8 mL

## 2017-01-27 MED ORDER — DEXAMETHASONE SODIUM PHOSPHATE 10 MG/ML IJ SOLN
INTRAMUSCULAR | Status: DC | PRN
  Administered 2017-01-27: 4 mg via INTRAVENOUS

## 2017-01-27 MED ORDER — ONDANSETRON HCL 4 MG/2ML IJ SOLN: 4.0000 mg | Freq: Once | INTRAMUSCULAR | Status: DC | PRN

## 2017-01-27 MED ORDER — ACETAMINOPHEN 10 MG/ML IV SOLN
INTRAVENOUS | Status: DC | PRN
  Administered 2017-01-27: 1000 mg via INTRAVENOUS

## 2017-01-27 MED ORDER — BUPIVACAINE HCL (PF) 0.5 % IJ SOLN
INTRAMUSCULAR | Status: AC
  Filled 2017-01-27: qty 30

## 2017-01-27 MED ORDER — FENTANYL CITRATE (PF) 100 MCG/2ML IJ SOLN
INTRAMUSCULAR | Status: DC | PRN
  Administered 2017-01-27 (×2): 25 ug via INTRAVENOUS
  Administered 2017-01-27: 50 ug via INTRAVENOUS

## 2017-01-27 MED ORDER — ACETAMINOPHEN 10 MG/ML IV SOLN
INTRAVENOUS | Status: AC
  Filled 2017-01-27: qty 100

## 2017-01-27 MED ORDER — IPRATROPIUM-ALBUTEROL 0.5-2.5 (3) MG/3ML IN SOLN
3.0000 mL | Freq: Once | RESPIRATORY_TRACT | Status: AC
  Administered 2017-01-27: 3 mL via RESPIRATORY_TRACT

## 2017-01-27 MED ORDER — MIDAZOLAM HCL 2 MG/2ML IJ SOLN
INTRAMUSCULAR | Status: AC
  Filled 2017-01-27: qty 2

## 2017-01-27 MED ORDER — GLYCOPYRROLATE 0.2 MG/ML IJ SOLN
INTRAMUSCULAR | Status: DC | PRN
  Administered 2017-01-27: 0.2 mg via INTRAVENOUS

## 2017-01-27 SURGICAL SUPPLY — 25 items
BANDAGE ELASTIC 2 LF NS (GAUZE/BANDAGES/DRESSINGS) ×2 IMPLANT
BNDG ESMARK 4X12 TAN STRL LF (GAUZE/BANDAGES/DRESSINGS) ×2 IMPLANT
CHLORAPREP W/TINT 26ML (MISCELLANEOUS) ×4 IMPLANT
CUFF TOURN 18 STER (MISCELLANEOUS) ×2 IMPLANT
ELECT REM PT RETURN 9FT ADLT (ELECTROSURGICAL) ×2
ELECTRODE REM PT RTRN 9FT ADLT (ELECTROSURGICAL) ×1 IMPLANT
GAUZE SPONGE 4X4 12PLY STRL (GAUZE/BANDAGES/DRESSINGS) ×2 IMPLANT
GLOVE BIO SURGEON STRL SZ7.5 (GLOVE) ×2 IMPLANT
GLOVE BIO SURGEON STRL SZ8 (GLOVE) ×4 IMPLANT
GLOVE BIOGEL M STRL SZ7.5 (GLOVE) ×2 IMPLANT
GLOVE INDICATOR 8.0 STRL GRN (GLOVE) ×2 IMPLANT
GOWN STRL REUS W/ TWL LRG LVL3 (GOWN DISPOSABLE) ×2 IMPLANT
GOWN STRL REUS W/TWL LRG LVL3 (GOWN DISPOSABLE) ×2
KIT RM TURNOVER STRD PROC AR (KITS) ×2 IMPLANT
NS IRRIG 500ML POUR BTL (IV SOLUTION) ×2 IMPLANT
PACK EXTREMITY ARMC (MISCELLANEOUS) ×2 IMPLANT
PADDING CAST 2X4YD ST (MISCELLANEOUS) ×1
PADDING CAST BLEND 2X4 STRL (MISCELLANEOUS) ×1 IMPLANT
SOL PREP PVP 2OZ (MISCELLANEOUS) ×2
SOLUTION PREP PVP 2OZ (MISCELLANEOUS) ×1 IMPLANT
SPLINT CAST 1 STEP 3X12 (MISCELLANEOUS) ×2 IMPLANT
STOCKINETTE STRL 4IN 9604848 (GAUZE/BANDAGES/DRESSINGS) ×2 IMPLANT
SUT ETHILON 4-0 (SUTURE) ×1
SUT ETHILON 4-0 FS2 18XMFL BLK (SUTURE) ×1
SUTURE ETHLN 4-0 FS2 18XMF BLK (SUTURE) ×1 IMPLANT

## 2017-01-27 NOTE — Anesthesia Postprocedure Evaluation (Signed)
Anesthesia Post Note  Patient: Rachel [name]  Procedure(s) Performed: Procedure(s) (LRB): CARPAL TUNNEL RELEASE (Right)  Patient location during evaluation: PACU Anesthesia Type: General Level of consciousness: awake and alert Pain management: pain level controlled Vital Signs Assessment: post-procedure vital signs reviewed and stable Respiratory status: spontaneous breathing, nonlabored ventilation, respiratory function stable and patient connected to nasal cannula oxygen Cardiovascular status: blood pressure returned to baseline and stable Postop Assessment: no signs of nausea or vomiting Anesthetic complications: no     Last Vitals:  Vitals:   01/27/17 1100 01/27/17 1124  BP: (!) 116/49 (!) 111/48  Pulse: 77 79  Resp: 20   Temp: 36.3 C     Last Pain:  Vitals:   01/27/17 1100  TempSrc:   PainSc: 3                  Orlin Kann S

## 2017-01-27 NOTE — Anesthesia Procedure Notes (Signed)
Procedure Name: LMA Insertion Date/Time: 01/27/2017 9:10 AM Performed by: Marlana SalvageJESSUP, Kenyatta Gloeckner Pre-anesthesia Checklist: Patient identified, Emergency Drugs available, Suction available, Patient being monitored and Timeout performed Patient Re-evaluated:Patient Re-evaluated prior to inductionOxygen Delivery Method: Circle system utilized Preoxygenation: Pre-oxygenation with 100% oxygen Intubation Type: IV induction Ventilation: Mask ventilation without difficulty LMA: LMA inserted LMA Size: 4.0 Number of attempts: 1 Placement Confirmation: positive ETCO2 and breath sounds checked- equal and bilateral Tube secured with: Tape Dental Injury: Teeth and Oropharynx as per pre-operative assessment

## 2017-01-27 NOTE — Progress Notes (Signed)
Duo neb given for low sat on 88 on room  Air

## 2017-01-27 NOTE — H&P (Signed)
  H and P reviewed. No changes. Uploaded at later date. 

## 2017-01-27 NOTE — Anesthesia Post-op Follow-up Note (Cosign Needed)
Anesthesia QCDR form completed.        

## 2017-01-27 NOTE — Op Note (Signed)
DATE OF SURGERY:  01/27/2017  PATIENT NAME:  Rachel [name]   DOB: 1981/11/02  MRN: [id number]  PRE-OPERATIVE DIAGNOSIS: Right carpal tunnel syndrome  POST-OPERATIVE DIAGNOSIS:  Same  PROCEDURE: Right carpal tunnel release  SURGEON: Dr. Erin SonsHarold Alexavier Tsutsui, Montez HagemanJr. M.D.  ANESTHESIA: Gen.   INDICATIONS FOR SURGERY: Rachel [name] is a 36 y.o. year old female with a long history of numbness and paresthesias in the right hand. Nerve conduction studies demonstrated findings consistent with significant  median nerve compression.The patient had not seen any significant improvement despite conservative nonsurgical intervention. After discussion of the risks and benefits of surgical intervention, the patient expressed understanding of the risks benefits and agreed with plans for carpal tunnel release.   PROCEDURE IN DETAIL: The patient was taken the operating room where satisfactory general anesthesia was achieved. A tourniquet was placed on the patient's right upper arm.The right hand and arm were prepped  and draped in the usual sterile fashion. A "time-out" was performed as per usual protocol. The hand and forearm were exsanguinated using an Esmarch and the tourniquet was inflated to 250 mmHg.  An incision was made just ulnar to the thenar palmar crease. Dissection was carried down through the palmar fascia to the transverse carpal ligament. The transverse carpal ligament was sharply incised, taking care to protect the underlying structures within the carpal tunnel. Complete release of the transverse carpal ligament was achieved. There was no evidence of a mass or proliferative synovitis within the carpal tunnel. The median nerve did appear to be slightly flattened. The wound was irrigated with saline. The tourniquet was released at this time. It had been up for about 11 minutes. Bleeding was controlled with digital pressure and coagulation cautery. I did inject the subcutaneous tissue of the wound with about 5 cc of  0.5% Marcaine without epinephrine. The skin was then re-approximated with interrupted sutures of #4-0 nylon. A sterile dressing was applied followed by application of a volar splint.  The patient was awakened and transferred to a stretcher bed.  The patient tolerated the procedure well and was transported to the PACU in stable condition. Blood loss was negligible.  Dr. Isidoro DonningHarold Demian Maisel, Jr. M.D.

## 2017-01-27 NOTE — Discharge Instructions (Signed)
Ice pack °Elevation ° RTC in 2 weeks ° °AMBULATORY SURGERY  °DISCHARGE INSTRUCTIONS ° ° °1) The drugs that you were given will stay in your system until tomorrow so for the next 24 hours you should not: ° °A) Drive an automobile °B) Make any legal decisions °C) Drink any alcoholic beverage ° ° °2) You may resume regular meals tomorrow.  Today it is better to start with liquids and gradually work up to solid foods. ° °You may eat anything you prefer, but it is better to start with liquids, then soup and crackers, and gradually work up to solid foods. ° ° °3) Please notify your doctor immediately if you have any unusual bleeding, trouble breathing, redness and pain at the surgery site, drainage, fever, or pain not relieved by medication. ° ° ° °4) Additional Instructions: ° ° ° ° ° ° ° °Please contact your physician with any problems or Same Day Surgery at [phone], Monday through Friday 6 am to 4 pm, or [place] at [place] Main number at [phone]. °

## 2017-01-27 NOTE — Transfer of Care (Signed)
Immediate Anesthesia Transfer of Care Note  Patient: Rachel [name]  Procedure(s) Performed: Procedure(s): CARPAL TUNNEL RELEASE (Right)  Patient Location: PACU  Anesthesia Type:General  Level of Consciousness: awake, alert , oriented and patient cooperative  Airway & Oxygen Therapy: Patient Spontanous Breathing and Patient connected to face mask oxygen  Post-op Assessment: Report given to RN and Post -op Vital signs reviewed and stable  Post vital signs: Reviewed and stable  Last Vitals:  Vitals:   01/27/17 0808 01/27/17 1005  BP: 107/72 137/81  Pulse: 86 95  Resp: 18 18  Temp: 36.7 C 36.3 C    Last Pain:  Vitals:   01/27/17 1005  TempSrc: Temporal      Patients Stated Pain Goal: 0 (01/27/17 [id number])  Complications: No apparent anesthesia complications

## 2017-01-27 NOTE — Progress Notes (Signed)
Sat on room air 91 to 5992  Dr Maisie Fusthomas aware  Okay to go to sds

## 2017-01-27 NOTE — Anesthesia Preprocedure Evaluation (Signed)
Anesthesia Evaluation  Patient identified by MRN, date of birth, ID band Patient awake    Reviewed: Allergy & Precautions, NPO status , Patient's Chart, lab work & pertinent test results, reviewed documented beta blocker date and time   Airway Mallampati: III  TM Distance: >3 FB     Dental  (+) Chipped   Pulmonary Current Smoker,           Cardiovascular      Neuro/Psych  Headaches, Seizures -,  PSYCHIATRIC DISORDERS Depression Bipolar Disorder    GI/Hepatic   Endo/Other  diabetes, Type 2  Renal/GU      Musculoskeletal   Abdominal   Peds  Hematology   Anesthesia Other Findings Obesity.  Reproductive/Obstetrics                             Anesthesia Physical Anesthesia Plan  ASA: III  Anesthesia Plan: General   Post-op Pain Management:    Induction: Intravenous  Airway Management Planned: LMA  Additional Equipment:   Intra-op Plan:   Post-operative Plan:   Informed Consent: I have reviewed the patients History and Physical, chart, labs and discussed the procedure including the risks, benefits and alternatives for the proposed anesthesia with the patient or authorized representative who has indicated his/her understanding and acceptance.     Plan Discussed with: CRNA  Anesthesia Plan Comments:         Anesthesia Quick Evaluation

## 2018-12-06 ENCOUNTER — Other Ambulatory Visit (HOSPITAL_COMMUNITY): Payer: Self-pay | Admitting: Student

## 2018-12-06 ENCOUNTER — Other Ambulatory Visit: Payer: Self-pay | Admitting: Student

## 2018-12-20 ENCOUNTER — Ambulatory Visit
Admission: RE | Admit: 2018-12-20 | Discharge: 2018-12-20 | Disposition: A | Discharge status: Home / Self Care | Payer: Medicare Other | Source: Ambulatory Visit | Attending: Student | Admitting: Student

## 2018-12-29 ENCOUNTER — Encounter: Payer: Self-pay | Admitting: Dietician

## 2018-12-29 ENCOUNTER — Encounter: Payer: Medicare Other | Attending: Surgery | Admitting: Dietician

## 2018-12-29 VITALS — Ht 64.0 in | Wt 285.9 lb

## 2018-12-29 DIAGNOSIS — Z6841 Body Mass Index (BMI) 40.0 and over, adult: Secondary | ICD-10-CM | POA: Insufficient documentation

## 2018-12-29 DIAGNOSIS — Z713 Dietary counseling and surveillance: Secondary | ICD-10-CM | POA: Diagnosis not present

## 2018-12-29 DIAGNOSIS — E119 Type 2 diabetes mellitus without complications: Secondary | ICD-10-CM | POA: Insufficient documentation

## 2018-12-29 NOTE — Progress Notes (Signed)
Nutrition Assessment  Proposed Surgery: gastric bypass  MD: Luretha Murphy RD: Connye Burkitt  Height: 5'4" Weight: 285.9lbs BMI: 49.07 Upper IBW% (UIBW): 216% (UIBW 132lbs)  Patient's Goal Weight: ideally 150lbs  Medical History: Type 2 diabetes, hyperlipidemia, diverticulosis, sleep apnea, seizures Medications and Supplements:  Atorvastatin, canagliflozin, citalopram, Fluticasone-salmaterol, lamoTRIgine, levETIRAcetam, liraglutide, losartan, naproxen sodium, PROAIR HFA inhaler, Vitamin B12, Vitamin D  Previous surgeries: colon resection Drug allergies: codeine, morphine, phenergan Food allergies: none Alcohol use: none  Tobacco use: cigarettes, 1 pack per day  Physical activity: none  Weight history: Childhood: normal    Adolescence: overweight, walked 2 miles daily for weight control; gained weight in high school, diagnosed with PCOS    Adulthood: overeight/ obesity    Weight 1 year ago: 280s  Dieting/ weight loss history: lost about 40lbs when + after pregnant with son 12 years ago by eating healthier, then gradually regained the weight; same weight for past 5-6 years  Wants to improve health, reduce meds, relieve pain through weight loss.  Patient has begun reducing soda intake, and intake of sweets.   Dietary Recall:  Daily pattern: 1-2 meals and 1 snacks. Dining out: 1-2 meals per week.  Breakfast: sometimes bakery muffin, biscuit, sometimes none Lunch/ supper (1 meal in afternoon): sandwich with [place]; husband often cooks chicken + veg ie green beans Snack(s): evening-- mixed nuts handful; wheat thins small portion; pkg crackers; fudgesicle about once a week Beverages: water (carries insulated water bottle), diet Mt Dew, occasional sweet tea  Psychosocial: Emotional eating history: increased eating when trying to quit smoking Disordered eating history: no  Intervention:  Patient has researched this procedure by online research; family friend who has undergone bariatric  surgery.    Instructed her on pre-op diet guidelines, basic meal planning for diabetes and weight loss.   Discussed stages of the bariatric diet after surgery as well as the importance of adequate protein and fluid intake.   Summary:  Patient has begun making diet and lifestyle changes in effort to lose weight and prepare for bariatric surgery.  She has solid support from family; husband present at visit.   She agrees to work on reducing caffeine intake, control carbohydrate intake, and chew food more thoroughly prior to surgery.   She is motivated to follow the bariatric diet after surgery. From a nutrition standpoint, she is ready to proceed with the bariatric surgery program.    Plan:  Patient commits to returning for 6 monthly supervised weight loss visits + pre-op class prior to surgery.   She will plan to return for post-op RD visits beginning 2 weeks after surgery.

## 2018-12-29 NOTE — Patient Instructions (Signed)
   Eat 3 meals daily, eat every 4-5 hours during the day.  Practice chewing foods thoroughly, aim for about 30 chews per bite.   Gradually reduce caffeine intake.   Control portions of carbohydrate foods to 45 grams (3 servings) or less with each meal.

## 2019-01-03 ENCOUNTER — Encounter: Payer: Medicare Other | Admitting: Dietician

## 2019-01-03 DIAGNOSIS — Z6841 Body Mass Index (BMI) 40.0 and over, adult: Principal | ICD-10-CM

## 2019-01-03 NOTE — Patient Instructions (Signed)
Continue with previous goals to limit caffeine, eat 3 meals per day spaced 4-5 hours apart and practice chewing. Practice not drinking beverages with meals. Continue to balance meals with protein, 1-2 starch servings, free vegetable. Can add a fruit to any meal. Ok to have an evening snack especially when dinner is early. Include a serving of carbohydrate (15-20 gms) and 1 oz protein.

## 2019-01-03 NOTE — Progress Notes (Signed)
   1st Supervised Weight Loss visit:  Visit start time: 1305  end time: 1350  Assessment:  Diagnosis: obesity Past medical history: Type 2 Diabetes, hyperlipidemia, seizures, diverticulosis  Current weight: 286.8 lbs Height: 64 in Medications, supplements: no change from initial visit (12/29/18)  Progress and evaluation:  Patient in for supervised weight loss visit in preparation for bariatric surgery. She has followed through to significantly decrease her diet soda intake to 1(16 oz) per day.  She is also eating 3 meals daily. She gave a recall yesterday of 1 package oatmeal for breakfast, 3 oz pork tenderloin, salad for lunch and grilled chicken and green beans for dinner. She takes "a handful of wheat thins" to eat throughout the day. Her beverages are water, 16 oz diet soda and sweet tea (once weekly) She has been practicing chewing her food. She expressed frustration that she has not lost weight with the diet changes she has made. She has not been checking glucose but states she had a good reading at her MD's office last week.   Nutrition Care Education: Bariatric Surgery: Reviewed pre-op goals and commended on the positive diet changes she has made. Discussed additional steps she can take toward pre-op goals. Weight control:  Used food guide plate to review balance of carbohydrate, protein and non-starchy vegetables. Encouraged to include at least one starch serving per meal. She admits to being hungry after dinner; gave and reviewed snack options.  Intervention/Plan:  Continue with previous goals to limit caffeine, eat 3 meals per day spaced 4-5 hours apart and practice chewing. Practice not drinking beverages with meals. Continue to balance meals with protein, 1-2 starch servings, free vegetable. Can add a fruit to any meal. Ok to have an evening snack especially when dinner is early. Include a serving of carbohydrate (15-20 gms) and 1 oz protein.   Education Materials given:   . Plate Planner . Snacking handout . Goals/ instructions Learner/ who was taught:  . Patient  Level of understanding: . Partial understanding; needs review/ practice  Demonstrated degree of understanding via:   Teach back Learning barriers: . None  Willingness to learn/ readiness for change:  Change in progress Monitoring and Evaluation:  Dietary intake, exercise, and body weight      follow up: 02/02/19

## 2019-02-02 ENCOUNTER — Encounter: Payer: Self-pay | Admitting: Dietician

## 2019-02-02 ENCOUNTER — Encounter: Payer: Medicare Other | Attending: Surgery | Admitting: Dietician

## 2019-02-02 VITALS — Ht 64.0 in | Wt 292.1 lb

## 2019-02-02 DIAGNOSIS — E119 Type 2 diabetes mellitus without complications: Secondary | ICD-10-CM | POA: Diagnosis not present

## 2019-02-02 DIAGNOSIS — Z713 Dietary counseling and surveillance: Secondary | ICD-10-CM | POA: Diagnosis not present

## 2019-02-02 DIAGNOSIS — Z6841 Body Mass Index (BMI) 40.0 and over, adult: Secondary | ICD-10-CM | POA: Diagnosis not present

## 2019-02-02 NOTE — Progress Notes (Signed)
Appt start time: 1330 end time:  1400.  Assessment:   #2 SWL Appointment.   Start Wt at NDES: 285.9lbs Wt: 292.1lbs Ht: 5'4" BMI: 50.14  Preferred Learning Style:   Visual   Learning Readiness:   Change in progress  MEDICATIONS: atorvastatin, canagliflozin, citalopram, fluticasone-salmeterol, lamoTRIgine, levETIRAcetam, liraglutide, losartan, naproxen sodium, proair inhaler, Vitamin B12, Vitamin D  Progress:   Weight has increased 5.7lbs since previous visit on 01/03/19; patient feels due to increased eating during wedding anniversary and Valentine's Day. She has not been as active physically either, due to frequent inclement weather.   She experienced bruised hip (right) after falling during a recent seizure, which has also limited her activity.   Patient is working to resume exercise and reduce sugar and carb intake.   Patient is working to reduce fluid intake at mealtimes, but states this is challenging for her.   Patient has sampled several different protein drinks and has not yet found one she likes. She is now checking into recipes to make her own protein shakes that meet bariatric criteria.   DIETARY INTAKE: 24-hr recall:  Breakfast: pkg oatmeal plain with 2-3 slices of peaches Snack: 1/2 protein granola bar or fruit, or yogurt, or nuts  Lunch: salad with steamed shrimp; canned soup with 5-6 oyster crackers; rice cakes with peanut butter  Snack: none or same as am Dinner: in past week-- shrimp with sides; chicken breast with; 6-inch sub with half bread Snack: 1/2 protein granola bar; or pineapple; or yogurt, or handful of almonds or pistachios, or rice cakes with peanut butter Beverages: water, 1-2 bottles diet Mt. Dew  Usual physical activity: none currently  Diet to Follow: 15-30 g carbohydrates Continue to limit sugar, starches and increase low-carb veg              Nutritional Diagnosis:  [place]-3.3 Overweight/obesity related to history of excess calories and  physical inactivity as evidenced by patient current BMI of 50.14 following dietary guidelines for continued weight loss prior to bariatric surgery.              Intervention:   . Nutrition counseling for weight loss prior to upcoming bariatric surgery. . Discussed strategies for ongoing decrease in fluid intake during meals.  . Encouraged continued gradual decrease in carb intake.   Teaching Method Utilized:  Visual Auditory Hands on  Handouts given during visit include:  Goals and Instructions  Barriers to learning/adherence to lifestyle change: none  Demonstrated degree of understanding via:  Teach Back   Monitoring/Evaluation:  Dietary intake, exercise, and body weight 03/03/19.

## 2019-02-02 NOTE — Patient Instructions (Signed)
   Great job limiting sodas and controlling carb intake!  Continue to practice eating slowly and limiting fluids.  Start some regular exercise, start with short duration like 10-15 minutes and gradually increase duration and/or how often you exercise.

## 2019-03-02 ENCOUNTER — Telehealth: Payer: Self-pay | Admitting: Dietician

## 2019-03-02 NOTE — Telephone Encounter (Signed)
Called patient to confirm appointment for tomorrow, 03/03/19. Patient is able to and plans to keep the appointment.

## 2019-03-03 ENCOUNTER — Encounter: Payer: Self-pay | Admitting: Dietician

## 2019-03-03 ENCOUNTER — Other Ambulatory Visit: Payer: Self-pay

## 2019-03-03 ENCOUNTER — Encounter: Payer: Medicare Other | Attending: Surgery | Admitting: Dietician

## 2019-03-03 VITALS — Ht 64.0 in | Wt 294.9 lb

## 2019-03-03 DIAGNOSIS — Z6841 Body Mass Index (BMI) 40.0 and over, adult: Secondary | ICD-10-CM

## 2019-03-03 DIAGNOSIS — Z713 Dietary counseling and surveillance: Secondary | ICD-10-CM | POA: Insufficient documentation

## 2019-03-03 DIAGNOSIS — E119 Type 2 diabetes mellitus without complications: Secondary | ICD-10-CM | POA: Diagnosis not present

## 2019-03-03 NOTE — Progress Notes (Signed)
Appt start time: 1330 end time:  1400.  Assessment:   #3 SWL Appointment.   Start Wt at NDES: 285.9lbs Wt: 294.9lbs Ht: 5'4" BMI: 50.62  Preferred Learning Style:   Visual   Learning Readiness:   Change in progress  MEDICATIONS: atorvastatin, canagliflozin, citalopram, fluticasone-salmeterol, lamoTRIgine, levETIRAcetam, liraglutide, losartan, naproxen sodium, proair inhaler, Vitamin B12, Vitamin D  Progress:   Patient reports ongoing diet changes; she has stopped protein bars (chocolate increased taste for sweets); further decreased intake of sodas; further decreased carbohydrate intake.  She reports increased stress in the past several weeks.   Patient voices frustration due to weight gain despite making positive changes and controlling calories. She is unable to think of any particular reason for weight gain. (has gained 9lbs since initial visit 12/29/18)  She has been incorporating some protein drinks at lunch meals.   DIETARY INTAKE: 24-hr recall:  Breakfast: 1-2 scrambled eggs ; small portion low-sugar cereal with clusters; plain oatmeal 1x a week Snack: crackers or walnuts if hungry  Lunch: protein shake mixed with water and ice + deli meat + 2 small cubes cheese + 3 crackers Snack: handful walnuts Dinner: salad with grilled chicken or occasionally boiled shrimp; raw broccoli and cauliflower, cucumbers Snack: 3-4 crackers or handful of walnuts if hungry Beverages: water, 2 diet mt dews daily; small cup 2% milk daily for calcium (does not like milk)  Usual physical activity: walking in driveway (3/4 mile long) 1.5 miles daily; otherwise less active due to increased stress.   Diet to Follow: Continue current eating pattern, aiming for 15-30 g carbohydrates each meal.               Nutritional Diagnosis:  [place]-3.3 Overweight/obesity related to history of excess calories and physical inactivity as evidenced by patient current BMI of 50.62 following dietary guidelines for  continued weight loss prior to bariatric surgery.              Intervention:   . Nutrition counseling for weight loss prior to upcoming bariatric surgery. . Discussed non-food factors that could affect weight loss, including stress, fluid intake, bowel habits, physical activity.  Marland Kitchen Updated goals to continue efforts at weight loss.  . Commended patient for positive changes, encouraged her to continue with healthy habits.   Teaching Method Utilized:  Visual Auditory  Handouts given during visit include:  Goals and Instructions   Barriers to learning/adherence to lifestyle change: none  Demonstrated degree of understanding via:  Teach Back   Monitoring/Evaluation:  Dietary intake, exercise, and body weight 04/03/19.

## 2019-03-03 NOTE — Patient Instructions (Signed)
   Continue making healthy food choices, great job!  Include about 15 grams of carbohydrate with each meal and see if energy improves. Limit to not more than 30grams.  Gradually increase physical exercise as able.

## 2019-03-06 ENCOUNTER — Other Ambulatory Visit: Payer: Self-pay

## 2019-04-03 ENCOUNTER — Encounter: Payer: Medicare Other | Attending: Surgery | Admitting: Dietician

## 2019-04-03 ENCOUNTER — Other Ambulatory Visit: Payer: Self-pay

## 2019-04-03 ENCOUNTER — Encounter: Payer: Self-pay | Admitting: Dietician

## 2019-04-03 VITALS — Ht 64.0 in | Wt 288.3 lb

## 2019-04-03 DIAGNOSIS — Z713 Dietary counseling and surveillance: Secondary | ICD-10-CM | POA: Insufficient documentation

## 2019-04-03 DIAGNOSIS — E119 Type 2 diabetes mellitus without complications: Secondary | ICD-10-CM | POA: Diagnosis not present

## 2019-04-03 DIAGNOSIS — Z6841 Body Mass Index (BMI) 40.0 and over, adult: Secondary | ICD-10-CM | POA: Diagnosis not present

## 2019-04-03 DIAGNOSIS — E66813 Obesity, class 3: Secondary | ICD-10-CM

## 2019-04-03 NOTE — Progress Notes (Signed)
Appt start time: 1330 end time:  1400.  Assessment:   #4 SWL Appointment.   Start Wt at NDES: 285.9lbs Wt: 288.3lbs Ht: 5'4" BMI: 49.49  Preferred Learning Style:   Visual    Learning Readiness:   Change in progress  MEDICATIONS: atorvastatin, canagliflozin, citalopram, fluticasone-salmeterol, lamoTRIgine, levETIRAcetam, liraglutide, losartan, naproxen sodium, proair inhaler, Vitamin B12, Vitamin D  Progress:   Weight loss of 6.6lbs in the past month.  Patient reports making some different choices with meals for variety, and continues to gradually reduce carbohydrate intake by incorporating more low-carb meals and choices such as zucchini noodles.  She has decreased sodas to 1 per day.  Has been researching bariatric recipes online and trying some of them.  DIETARY INTAKE: 24-hr recall:  Breakfast: 1/2 grapefruit + yogurt; 1/2 grapefruit + toast; protein drink  Snack: none or same as pm  Lunch: protein shake if not at breakfast; [place] + cheese; rotisserie chicken (no skin); salad often Snack: walnuts, or yogurt, or peanut butter crackers (2); handful granola; apple with peanut butter Dinner: 4/19 grilled steak + zucchini peppers onion and mushroom, 1/2 potato with butter and sour cream; usually protein + veg  Snack: none or same as pm Beverages: water, 1 diet Mt. Dew daily, Crystal Light lemonade, milk  Usual physical activity: increased general activity around home, moving furniture, repairs to cousin's floor.   Diet to Follow: 15-30 g carbohydrates Continue with current protein intake Low fat choices               Nutritional Diagnosis:  [place]-3.3 Overweight/obesity related to history of excess calories and physical inactivity as evidenced by patient current BMI of 49.49 following dietary guidelines for continued weight loss prior to bariatric surgery.              Intervention:   . Nutrition counseling for upcoming bariatric surgery. . Reviewed progress since  previous visit on 03/03/19; commended patient for positive changes made.  . Encouraged ongoing effort to decrease carb intake and increase physical activity.   Teaching Method Utilized:  Visual Auditory   Handouts given during visit include:  Goals and instructions   Barriers to learning/adherence to lifestyle change: none  Demonstrated degree of understanding via:  Teach Back   Monitoring/Evaluation:  Dietary intake, exercise, and body weight 05/03/19.

## 2019-04-03 NOTE — Patient Instructions (Signed)
   Continue making mostly low carb and low fat food choices, great job!  Keep gradually increasing physical activity as able.

## 2019-05-03 ENCOUNTER — Encounter: Payer: Self-pay | Admitting: Dietician

## 2019-05-03 ENCOUNTER — Encounter: Payer: Medicare Other | Attending: Surgery | Admitting: Dietician

## 2019-05-03 ENCOUNTER — Other Ambulatory Visit: Payer: Self-pay

## 2019-05-03 VITALS — Ht 64.0 in | Wt 283.7 lb

## 2019-05-03 DIAGNOSIS — E119 Type 2 diabetes mellitus without complications: Secondary | ICD-10-CM | POA: Diagnosis not present

## 2019-05-03 DIAGNOSIS — Z713 Dietary counseling and surveillance: Secondary | ICD-10-CM | POA: Diagnosis not present

## 2019-05-03 DIAGNOSIS — Z6841 Body Mass Index (BMI) 40.0 and over, adult: Secondary | ICD-10-CM | POA: Insufficient documentation

## 2019-05-03 NOTE — Patient Instructions (Addendum)
   Continue with current food choices and eating pattern.   Continue to gradually increase physical activity as able.

## 2019-05-03 NOTE — Progress Notes (Signed)
Appt start time: 1330 end time:  1400.  Assessment:   #5 SWL Appointment.   Start Wt at [facility]: 285.9lbs Wt: 283.7lbs Ht: 5'2.5" BMI: 48.7  Preferred Learning Style:   Visual   Learning Readiness:   Change in progress   MEDICATIONS: atorvastatin, canagliflozin, citalopram, fluticasone-salmeterol, lamoTRIgine, levETIRAcetam, liraglutide, losartan, naproxen sodium, proair inhaler, Vitamin B12, Vitamin D   Progress:   Weight loss of 4.6lbs in past 4 weeks; loss of 11.2lbs in past 2 months.  Patient reports decreased hunger when staying at home more.   She has had more constipation recently, she feels perhaps due to increased protein intake.  She continues to limit starchy foods and emphasize protein foods and low-carb vegetables.  DIETARY INTAKE:  2-3 meals and 1-2 snacks daily  Breakfast: apple and 1 Tbsp peanut butter; yogurt  Snack: protein shake or protein bar  Lunch: sometimes eats brunch instead of breakfast and lunch; or lite meal or snack (if early breakfast) -- rotisserie chicken with mustard, occasionally cheese; protein shake; 1/2 pita pocket with chicken Snack: occasional nuts; yogurt Dinner: lean meat and vegetables; sometimes grinds meat and veg to prepare for post-op diet. Sometimes fruit; splurged yesterday with steak and small baked potato Snack: none Beverages: water, crystal light lemonade, 1 diet Mt. Dew daily, no milk recently  Usual physical activity: walking 30 minutes daily (not in past few days due to inclement weather)  Diet to Follow: Low carb, low fat              Nutritional Diagnosis:  [place]-3.3 Overweight/obesity related to history of excess calories and physical inactivity as evidenced by patient current BMI of 48.7, following dietary guidelines for continued weight loss prior to bariatric surgery.              Intervention:   . Nutrition counseling for weight loss prior to bariatric surgery. . Reviewed progress since previous  visit. . Commended patient for positive diet changes and weight loss success.  . Goal is to continue with current eating patterns and regular exercise.  Teaching Method Utilized:  Visual Auditory Hands on  Handouts given during visit include:   Barriers to learning/adherence to lifestyle change: none  Demonstrated degree of understanding via:  Teach Back   Monitoring/Evaluation:  Dietary intake, exercise, and body weight 06/02/19.

## 2019-06-02 ENCOUNTER — Encounter: Payer: Medicare Other | Attending: Surgery | Admitting: Dietician

## 2019-06-02 ENCOUNTER — Ambulatory Visit: Payer: Self-pay | Admitting: Dietician

## 2019-06-02 ENCOUNTER — Other Ambulatory Visit: Payer: Self-pay

## 2019-06-02 ENCOUNTER — Encounter: Payer: Self-pay | Admitting: Dietician

## 2019-06-02 ENCOUNTER — Encounter: Payer: Medicare Other | Admitting: Dietician

## 2019-06-02 DIAGNOSIS — Z713 Dietary counseling and surveillance: Secondary | ICD-10-CM | POA: Diagnosis not present

## 2019-06-02 DIAGNOSIS — Z6841 Body Mass Index (BMI) 40.0 and over, adult: Secondary | ICD-10-CM | POA: Insufficient documentation

## 2019-06-02 DIAGNOSIS — E119 Type 2 diabetes mellitus without complications: Secondary | ICD-10-CM | POA: Insufficient documentation

## 2019-06-02 NOTE — Patient Instructions (Signed)
   Keep working to gradually increase the time between drinking and eating towards the goal of 15 minutes before and 30 minutes after meals.   Begin testing various protein shakes and supplements to determine your best options. Avoid stockpiling before surgery in case you have taste changes.   Continue with mostly low-carb meals.

## 2019-06-02 NOTE — Progress Notes (Signed)
Pre-Operative Nutrition Class:  Appt start time: 0900   End time:  1030.  Patient was seen on 06/02/19 for Pre-Operative Bariatric Surgery Education [facility].   Surgery date: TBD Surgery type: gastric bypass Start weight at [facility]: 285.9lbs Weight today: 280.5lbs  InBody  BODY COMP RESULTS    BMI (kg/m^2) 48.1  Fat Mass (lbs) 151.5  Fat Free Mass (lbs) 34.2  Total Body Water (lbs) 94.8   Samples given per MNT protocol. Patient educated on appropriate usage: Celebrate Vitamins Multivitamin Lot # [id number]  Exp: 02/2020  Celebrate Vitamins Calcium Citrate Lot # [id number]  Exp: 11/2019;  Lot# [id number]  Exp: 09/2019  Celebrate Iron Soft Chews Lot# [id number] Exp: 12/2019  Renee Pain Protein Powder Lot # [id number]  Exp: 02/2020; Lot# [id number]  Exp: 02/2020;  Lot# [id number]  Exp: 02/2020  Unjury Protein Shake Lot# [id number]  Exp: 06/25/19  The following the learning objectives were met by the patient during this course:  Identify Pre-Op Dietary Goals and will begin 2 weeks pre-operatively  Identify appropriate sources of fluids and proteins   State protein recommendations and appropriate sources pre and post-operatively  Identify Post-Operative Dietary Goals and will follow for 2 weeks post-operatively  Identify appropriate multivitamin and calcium sources  Describe the need for physical activity post-operatively and will follow MD recommendations  State when to call healthcare provider regarding medication questions or post-operative complications  Handouts given during class include:  Pre-Op Bariatric Surgery Diet Handout  Protein Shake Handout  Post-Op Bariatric Surgery Nutrition Handout  BEL[facility] Program Information Flyer  Support Group Information Flyer  [facility] Bariatric Supplements Price List  Follow-Up Plan: Patient will follow-up at [place], at about 2 weeks post operatively for diet advancement per MD.

## 2019-06-02 NOTE — Progress Notes (Signed)
Appt start time: 1040 end time:  1100.  Assessment:   #6 SWL Appointment.   Start Wt at [facility]: 285.9lbs Wt: 280.5lbs Ht: 5'4" BMI: 48.1  Preferred Learning Style:   Visual   Learning Readiness:   Change in progress  MEDICATIONS: atorvastatin, canagliflozin, citalopram, fluticasone-salmeterol, lamoTRIgine, levETIRAcetam, liraglutide, losartan, naproxen sodium, proair inhaler, Vitamin B12, Vitamin D   Progress:   Weight loss of 3.2lbs in the past month; patient has lost a total of 14.4lbs from her highest weight of 294.9 on 03/03/19.   Patient reports continued effort to control carb intake to low levels; has occasionally had high fiber cereal for breakfast. She is currently allowing for a "cheat" meal once a week.   She is making slow progress with limiting fluids during meals.  Patient is currently smoking 1 pack cigarettes daily.   DIETARY INTAKE: 24-hr recall: 2-3 meals + 1-2 snacks daily Breakfast: apple and peanut butter, or yogurt, or oatmeal, or raisin bran  Snack: none or nuts or yogurt or protein bar  Lunch: (sometimes eats brunch rather than 2 meals); rotisserie chicken with mustard, pita pocket with chicken, cheese; protein shake.  Snack: same as am Dinner: lean meat and vegetables Snack: none or same as am Beverages: water, crystal light, 1 diet Mt dew daily  Usual physical activity: walking when able; less recently due to inclement weather  Diet to Follow: Ongoing reduction in carbohydrate intake Ongoing reduction in fluids before and after meals Pre-op diet 2 weeks before surgery               Nutritional Diagnosis:  [place]-3.3 Overweight/obesity related to history of excess calories and physical inactivity as evidenced by patient current BMI of 48.1 following dietary guidelines for continued weight loss prior to bariatric surgery.              Intervention:   . Nutrition counseling for weight loss prior to upcoming bariatric surgery.  Reviewed progress  since previous visit and commended patient for ongoing diet modification and weight loss.  Reviewed pre-op diet goals and 2-week pre-op diet guidelines. Patient voices confidence that she can follow the diet.   Patient voices understanding of post-op diet as well and confidence that she can comply with dietary guidelines and restrictions.   This patient nutritionally is a good candidate for bariatric surgery.   Teaching Method Utilized:  Visual Auditory Hands on  Handouts given during visit include:  Goals and Instructions   Barriers to learning/adherence to lifestyle change: none  Demonstrated degree of understanding via:  Teach Back   Monitoring/Evaluation:  Dietary intake, exercise, and body weight Pre-op class completed today. Future visit(s):  2 weeks post op, to be scheduled.

## 2019-06-08 ENCOUNTER — Ambulatory Visit: Payer: Medicare Other | Admitting: Psychiatry

## 2019-09-12 ENCOUNTER — Ambulatory Visit: Payer: Medicare Other | Admitting: Obstetrics and Gynecology

## 2019-09-19 ENCOUNTER — Encounter: Payer: Self-pay | Admitting: Family Medicine

## 2019-09-19 ENCOUNTER — Ambulatory Visit (INDEPENDENT_AMBULATORY_CARE_PROVIDER_SITE_OTHER): Payer: Medicare Other | Admitting: Family Medicine

## 2019-09-19 ENCOUNTER — Other Ambulatory Visit: Payer: Self-pay

## 2019-09-19 ENCOUNTER — Encounter: Payer: Self-pay | Admitting: *Deleted

## 2019-09-19 VITALS — BP 118/72 | HR 94 | Wt 281.0 lb

## 2019-09-19 DIAGNOSIS — Z30433 Encounter for removal and reinsertion of intrauterine contraceptive device: Secondary | ICD-10-CM

## 2019-09-19 MED ORDER — LEVONORGESTREL 20 MCG/24HR IU IUD: 1.0000 | INTRAUTERINE_SYSTEM | Freq: Once | INTRAUTERINE | 0 refills | Status: DC

## 2019-09-19 MED ORDER — LEVONORGESTREL 20 MCG/24HR IU IUD
INTRAUTERINE_SYSTEM | Freq: Once | INTRAUTERINE | Status: AC
  Administered 2019-09-19: 14:00:00 via INTRAUTERINE

## 2019-09-19 NOTE — Progress Notes (Signed)
    Subjective: Patient here for IUD change.  Has been in for 5 years and due to be changed. She has no cycles and desires to continue Mirena IUD for contraception.  Objective: Vitals:   09/19/19 1310  BP: 118/72  Pulse: 94    Patient identified, informed consent performed, signed copy in chart, time out was performed Procedure: Speculum placed inside vagina.  Cervix visualized.  Strings grasped with ring forceps.  IUD removed intact.  Procedure: Cervix visualized.  Cleaned with Betadine x 2.  Grasped anteriourly with a single tooth tenaculum.  Uterus sounded to 7 cm.  Mirena IUD placed per manufacturer's recommendations.  Strings trimmed to 3 cm.   Patient given post procedure instructions and Mirena care card with expiration date.  Patient is asked to check IUD strings periodically and follow up in 4-6 weeks for IUD check.  Impression: Encounter for IUD removal and reinsertion   Plan: Continue IUD for 5 years.

## 2019-09-19 NOTE — Progress Notes (Signed)
Here for IUD replacement

## 2019-09-20 ENCOUNTER — Encounter: Payer: Self-pay | Admitting: Radiology

## 2019-10-06 ENCOUNTER — Ambulatory Visit (INDEPENDENT_AMBULATORY_CARE_PROVIDER_SITE_OTHER): Payer: Medicare Other | Admitting: Psychology

## 2019-10-06 DIAGNOSIS — F509 Eating disorder, unspecified: Secondary | ICD-10-CM

## 2019-10-18 ENCOUNTER — Ambulatory Visit: Admitting: Psychology

## 2019-10-20 ENCOUNTER — Ambulatory Visit (INDEPENDENT_AMBULATORY_CARE_PROVIDER_SITE_OTHER): Payer: Medicare Other | Admitting: Psychology

## 2019-10-20 DIAGNOSIS — F509 Eating disorder, unspecified: Secondary | ICD-10-CM | POA: Diagnosis not present

## 2020-01-18 ENCOUNTER — Telehealth: Payer: Self-pay | Admitting: Dietician

## 2020-01-18 NOTE — Telephone Encounter (Signed)
Called patient for nutrition update for bariatric pre-op class.  She has begun the pre-op diet, having a Premier protein shake for one meal daily, with 2 meals consisting of chicken breast, fish, or shrimp cooked without added fat, + low-carb vegetables. She is using broth to season/ cook.  She has begun taking bariatric multivitamins.  Reviewed liquid diet for post-op and options for protein and fluids. Reviewed daily goals for protein and fluids.  Patient has positive family support and is ready to move forward with bariatric surgery and diet. She has contact information to call with any questions or concerns.

## 2020-01-24 NOTE — Progress Notes (Signed)
Can you please place some orders for the upcoming surgery.Pt. PST and lab appointments  Are tomorrow.Thank you.

## 2020-01-25 ENCOUNTER — Other Ambulatory Visit (HOSPITAL_COMMUNITY): Payer: Medicare Other

## 2020-01-25 ENCOUNTER — Other Ambulatory Visit: Payer: Self-pay

## 2020-01-25 ENCOUNTER — Encounter (HOSPITAL_COMMUNITY)
Admission: RE | Admit: 2020-01-25 | Discharge: 2020-01-25 | Disposition: A | Discharge status: Home / Self Care | Payer: Medicare Other | Source: Ambulatory Visit | Attending: Surgery | Admitting: Surgery

## 2020-01-25 ENCOUNTER — Encounter (HOSPITAL_COMMUNITY): Payer: Self-pay

## 2020-01-25 ENCOUNTER — Inpatient Hospital Stay (HOSPITAL_COMMUNITY): Admission: RE | Admit: 2020-01-25 | Payer: Medicare Other | Source: Ambulatory Visit

## 2020-01-25 DIAGNOSIS — Z6835 Body mass index (BMI) 35.0-35.9, adult: Secondary | ICD-10-CM | POA: Diagnosis not present

## 2020-01-25 DIAGNOSIS — Z01812 Encounter for preprocedural laboratory examination: Secondary | ICD-10-CM | POA: Insufficient documentation

## 2020-01-25 DIAGNOSIS — E119 Type 2 diabetes mellitus without complications: Secondary | ICD-10-CM | POA: Diagnosis not present

## 2020-01-25 DIAGNOSIS — I10 Essential (primary) hypertension: Secondary | ICD-10-CM | POA: Insufficient documentation

## 2020-01-25 HISTORY — DX: Unspecified asthma, uncomplicated: J45.909

## 2020-01-25 HISTORY — DX: Sleep apnea, unspecified: G47.30

## 2020-01-25 LAB — BASIC METABOLIC PANEL
Anion gap: 10 (ref 5–15)
BUN: 7 mg/dL (ref 6–20)
CO2: 23 mmol/L (ref 22–32)
Calcium: 9 mg/dL (ref 8.9–10.3)
Chloride: 104 mmol/L (ref 98–111)
Creatinine, Ser: 0.54 mg/dL (ref 0.44–1.00)
GFR calc Af Amer: >60 mL/min (ref >60)
GFR calc non Af Amer: >60 mL/min (ref >60)
Glucose, Bld: 113 mg/dL — ABNORMAL HIGH (ref 70–99)
Potassium: 4 mmol/L (ref 3.5–5.1)
Sodium: 137 mmol/L (ref 135–145)

## 2020-01-25 LAB — CBC
HCT: 51.8 % — ABNORMAL HIGH (ref 36.0–46.0)
Hemoglobin: 16.4 g/dL — ABNORMAL HIGH (ref 12.0–15.0)
MCH: 27.9 pg (ref 26.0–34.0)
MCHC: 31.7 g/dL (ref 30.0–36.0)
MCV: 88.2 fL (ref 80.0–100.0)
Platelets: 327 10*3/uL (ref 150–400)
RBC: 5.87 MIL/uL — ABNORMAL HIGH (ref 3.87–5.11)
RDW: 13 % (ref 11.5–15.5)
WBC: 10.2 10*3/uL (ref 4.0–10.5)
nRBC: 0 % (ref 0.0–0.2)

## 2020-01-25 LAB — HEMOGLOBIN A1C
Hgb A1c MFr Bld: 7.7 % — ABNORMAL HIGH (ref 4.8–5.6)
Mean Plasma Glucose: 174.29 mg/dL

## 2020-01-25 NOTE — Progress Notes (Signed)
PCP -Dr. Ezekiel Slocumb K. LOV: 01/18/20 Cardiologist -   Chest x-ray - 12/20/18. EPIC EKG - 12/20/18. EPIC Stress Test -  ECHO -  Cardiac Cath -   Sleep Study - yes CPAP - yes  Fasting Blood Sugar -  Checks Blood Sugar _____ times a day  Blood Thinner Instructions: Aspirin Instructions: Last Dose:  Anesthesia review: Pt. Was advised by RN to stop smoking at least 24 hour prior her surgery,pt. Verbalized her understanding of this subject.  Patient denies shortness of breath, fever, cough and chest pain at PAT appointment   Patient verbalized understanding of instructions that were given to them at the PAT appointment. Patient was also instructed that they will need to review over the PAT instructions again at home before surgery.

## 2020-01-25 NOTE — Patient Instructions (Addendum)
DUE TO COVID-19 ONLY ONE VISITOR IS ALLOWED TO COME WITH YOU AND STAY IN THE WAITING ROOM ONLY DURING PRE OP AND PROCEDURE DAY OF SURGERY. THE 1 VISITOR MAY VISIT WITH YOU AFTER SURGERY IN YOUR PRIVATE ROOM DURING VISITING HOURS ONLY!  YOU NEED TO HAVE A COVID 19 TEST ON: 2/11/21_ @ 3:00 pm, THIS TEST MUST BE DONE BEFORE SURGERY, COME  [address], [place]  , [address].  [facility]) ONCE YOUR COVID TEST IS COMPLETED, PLEASE BEGIN THE QUARANTINE INSTRUCTIONS AS OUTLINED IN YOUR HANDOUT.                Lashaye C Spurr    Your procedure is scheduled on: 01/29/20   Report to [facility] Main  Entrance   Report to admitting at: 7:30 AM     Call this number if you have problems the morning of surgery [phone]    Remember:   Do not eat food or drink liquids :After Midnight.   BRUSH YOUR TEETH MORNING OF SURGERY AND RINSE YOUR MOUTH OUT, NO CHEWING GUM CANDY OR MINTS.     Take these medicines the morning of surgery with A SIP OF WATER: LAMICTAL,KEPRA.                                 You may not have any metal on your body including hair pins and              piercings  Do not wear jewelry, make-up, lotions, powders or perfumes, deodorant             Do not wear nail polish on your fingernails.  Do not shave  48 hours prior to surgery.                 Do not bring valuables to the hospital. [facility] IS NOT             RESPONSIBLE   FOR VALUABLES.  Contacts, dentures or bridgework may not be worn into surgery.  Leave suitcase in the car. After surgery it may be brought to your room.             How to Manage Your Diabetes Before and After Surgery  Why is it important to control my blood sugar before and after surgery? . Improving blood sugar levels before and after surgery helps healing and can limit problems. . A way of improving blood sugar control is eating a healthy diet by: o  Eating less sugar and carbohydrates o  Increasing  activity/exercise o  Talking with your doctor about reaching your blood sugar goals . High blood sugars (greater than 180 mg/dL) can raise your risk of infections and slow your recovery, so you will need to focus on controlling your diabetes during the weeks before surgery. . Make sure that the doctor who takes care of your diabetes knows about your planned surgery including the date and location.  How do I manage my blood sugar before surgery? . Check your blood sugar at least 4 times a day, starting 2 days before surgery, to make sure that the level is not too high or low. o Check your blood sugar the morning of your surgery when you wake up and every 2 hours until you get to the Short Stay unit. . If your blood sugar is less than 70 mg/dL, you will need to treat for  low blood sugar: o Do not take insulin. o Treat a low blood sugar (less than 70 mg/dL) with  cup of clear juice (cranberry or apple), 4 glucose tablets, OR glucose gel. o Recheck blood sugar in 15 minutes after treatment (to make sure it is greater than 70 mg/dL). If your blood sugar is not greater than 70 mg/dL on recheck, call [phone] for further instructions. . Report your blood sugar to the short stay nurse when you get to Short Stay.  . If you are admitted to the hospital after surgery: o Your blood sugar will be checked by the staff and you will probably be given insulin after surgery (instead of oral diabetes medicines) to make sure you have good blood sugar levels. o The goal for blood sugar control after surgery is 80-180 mg/dL.   WHAT DO I DO ABOUT MY DIABETES MEDICATION?  Marland Kitchen Do not take oral diabetes medicines (pills) the morning of surgery.  . THE DAY BEFORE SURGERY: HOLD INVOCANA AND LIRAGLUTIDE     DO NOT TAKE ANY DIABETIC MEDICATIONS DAY OF YOUR SURGERY      [place] - Preparing for Surgery Before surgery, you can play an important role.  Because skin is not sterile, your skin needs to be as free  of germs as possible.  You can reduce the number of germs on your skin by washing with CHG (chlorahexidine gluconate) soap before surgery.  CHG is an antiseptic cleaner which kills germs and bonds with the skin to continue killing germs even after washing. Please DO NOT use if you have an allergy to CHG or antibacterial soaps.  If your skin becomes reddened/irritated stop using the CHG and inform your nurse when you arrive at Short Stay. Do not shave (including legs and underarms) for at least 48 hours prior to the first CHG shower.  You may shave your face/neck. Please follow these instructions carefully:  1.  Shower with CHG Soap the night before surgery and the  morning of Surgery.  2.  If you choose to wash your hair, wash your hair first as usual with your  normal  shampoo.  3.  After you shampoo, rinse your hair and body thoroughly to remove the  shampoo.                           4.  Use CHG as you would any other liquid soap.  You can apply chg directly  to the skin and wash                       Gently with a scrungie or clean washcloth.  5.  Apply the CHG Soap to your body ONLY FROM THE NECK DOWN.   Do not use on face/ open                           Wound or open sores. Avoid contact with eyes, ears mouth and genitals (private parts).                       Wash face,  Genitals (private parts) with your normal soap.             6.  Wash thoroughly, paying special attention to the area where your surgery  will be performed.  7.  Thoroughly rinse your body with warm water from the neck down.  8.  DO NOT shower/wash with your normal soap after using and rinsing off  the CHG Soap.                9.  Pat yourself dry with a clean towel.            10.  Wear clean pajamas.            11.  Place clean sheets on your bed the night of your first shower and do not  sleep with pets. Day of Surgery : Do not apply any lotions/deodorants the morning of surgery.  Please wear clean clothes to the  hospital/surgery center.  FAILURE TO FOLLOW THESE INSTRUCTIONS MAY RESULT IN THE CANCELLATION OF YOUR SURGERY PATIENT SIGNATURE_________________________________  NURSE SIGNATURE__________________________________  ________________________________________________________________________

## 2020-01-26 ENCOUNTER — Other Ambulatory Visit (HOSPITAL_COMMUNITY)
Admission: RE | Admit: 2020-01-26 | Discharge: 2020-01-26 | Disposition: A | Discharge status: Home / Self Care | Payer: Medicare Other | Source: Ambulatory Visit | Attending: Surgery | Admitting: Surgery

## 2020-01-26 DIAGNOSIS — Z01812 Encounter for preprocedural laboratory examination: Secondary | ICD-10-CM | POA: Insufficient documentation

## 2020-01-26 DIAGNOSIS — Z20822 Contact with and (suspected) exposure to covid-19: Secondary | ICD-10-CM | POA: Insufficient documentation

## 2020-01-26 LAB — GLUCOSE, CAPILLARY: Glucose-Capillary: 129 mg/dL — ABNORMAL HIGH (ref 70–99)

## 2020-01-26 LAB — SARS CORONAVIRUS 2 (TAT 6-24 HRS): SARS Coronavirus 2: NEGATIVE

## 2020-01-26 NOTE — Progress Notes (Signed)
Pt aware to arrive at [facility] admitting at 7 am on Monday 01/29/2020.

## 2020-01-28 MED ORDER — BUPIVACAINE LIPOSOME 1.3 % IJ SUSP
20.0000 mL | INTRAMUSCULAR | Status: DC
  Filled 2020-01-28: qty 20

## 2020-01-28 NOTE — Anesthesia Preprocedure Evaluation (Addendum)
Anesthesia Evaluation  Patient identified by MRN, date of birth, ID band Patient awake    Reviewed: Allergy & Precautions, NPO status , Patient's Chart, lab work & pertinent test results  History of Anesthesia Complications Negative for: history of anesthetic complications  Airway Mallampati: III  TM Distance: >3 FB Neck ROM: Full    Dental no notable dental hx. (+) Dental Advisory Given   Pulmonary sleep apnea and Continuous Positive Airway Pressure Ventilation , Current Smoker,    Pulmonary exam normal        Cardiovascular hypertension, Pt. on medications Normal cardiovascular exam     Neuro/Psych Seizures -,  PSYCHIATRIC DISORDERS Depression Bipolar Disorder    GI/Hepatic Neg liver ROS,   Endo/Other  diabetesMorbid obesity  Renal/GU negative Renal ROS     Musculoskeletal negative musculoskeletal ROS (+)   Abdominal   Peds  Hematology negative hematology ROS (+)   Anesthesia Other Findings Day of surgery medications reviewed with the patient.  Reproductive/Obstetrics                            Anesthesia Physical Anesthesia Plan  ASA: III  Anesthesia Plan: General   Post-op Pain Management:    Induction: Intravenous  PONV Risk Score and Plan: 4 or greater and Ondansetron, Dexamethasone, Midazolam and Scopolamine patch - Pre-op  Airway Management Planned: Oral ETT  Additional Equipment:   Intra-op Plan:   Post-operative Plan: Extubation in OR  Informed Consent: I have reviewed the patients History and Physical, chart, labs and discussed the procedure including the risks, benefits and alternatives for the proposed anesthesia with the patient or authorized representative who has indicated his/her understanding and acceptance.     Dental advisory given  Plan Discussed with: Anesthesiologist and CRNA  Anesthesia Plan Comments:        Anesthesia Quick Evaluation

## 2020-01-28 NOTE — H&P (Signed)
Chief Complaint:  Presents for roux en Y gastric bypass  History of Present Illness:  Rachel [name] is an 39 y.o. female who has completed workup for bariatric surgery and DM and presents for roux en Y gastric bypass.  Her weight is ~290 with a BMI of 50.  She has had colon surgery and as a backup I have discussed sleeve gastrectomy and she has agreed if that is necessary.    Past Medical History:  Diagnosis Date  . Asthma   . Bipolar disorder ([place])   . Depression   . Diabetes mellitus without complication ([place])   . Diverticulosis   . Epilepsy ([place])   . 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 Migraines    "haven't had one since I was a kid" (06/05/2013)  . Sleep apnea    CPAP    Past Surgical History:  Procedure Laterality Date  . CARPAL TUNNEL RELEASE Left 2011?  . CARPAL TUNNEL RELEASE Right 01/27/2017   Procedure: CARPAL TUNNEL RELEASE;  Surgeon: Leanor Kail, MD;  Location: [facility] ORS;  Service: Orthopedics;  Laterality: Right;  . COLON SURGERY  2014   bowel resection/colostomy  . COLOSTOMY REVISION N/A 11/07/2013   Procedure: COLON RESECTION SIGMOID;  Surgeon: Gwenyth Ober, MD;  Location: [facility];  Service: General;  Laterality: N/A;  . ctr Left     Current Facility-Administered Medications  Medication Dose Route Frequency Provider Last Rate Last Admin  . [START ON 01/29/2020] bupivacaine liposome (EXPAREL) 1.3 % injection 266 mg  20 mL Infiltration On Call to OR Johnathan Hausen, MD       Current Outpatient Medications  Medication Sig Dispense Refill  . atorvastatin (LIPITOR) 40 MG tablet Take 40 mg by mouth daily at 6 PM.    . canagliflozin (INVOKANA) 300 MG TABS tablet Take 300 mg by mouth daily before breakfast.    . citalopram (CELEXA) 40 MG tablet Take 1 tablet (40 mg total) by mouth daily. (Patient taking differently: Take 40 mg by mouth at bedtime. ) 30 tablet 2  . Fluticasone-Salmeterol (ADVAIR) 250-50 MCG/DOSE AEPB  Inhale 1 puff into the lungs daily.     Marland Kitchen lamoTRIgine (LAMICTAL) 200 MG tablet Take 200 mg by mouth 3 (three) times daily.     Marland Kitchen levETIRAcetam (KEPPRA) 500 MG tablet Take 500 mg by mouth at bedtime.    . liraglutide 18 MG/3ML SOPN Inject 1.2 mg into the skin at bedtime.     Marland Kitchen losartan (COZAAR) 25 MG tablet Take 25 mg by mouth at bedtime.    . Multiple Vitamins-Minerals (BARIATRIC MULTIVITAMINS/IRON PO) Take 1 tablet by mouth daily.    Marland Kitchen PROAIR HFA 108 (90 Base) MCG/ACT inhaler Inhale 1-2 puffs into the lungs every 6 (six) hours as needed for wheezing or shortness of breath.      Hand sanitizer [ethyl alcohol (skin cleanser)], Codeine, Morphine and related, and Phenergan [promethazine hcl] Family History  Problem Relation Age of Onset  . Cancer Maternal Grandfather        prostate  . Cancer Maternal Aunt        breast   Social History:   reports that she has been smoking cigarettes. She has a 18.00 pack-year smoking history. She has never used smokeless tobacco. She reports that she does not drink alcohol or use drugs.   REVIEW OF SYSTEMS : Negative except for see problem list;  DM is the driver of the bypass over the  sleeve but if adhesions prevent bypass then we will do a sleeve gastrectomy.  I have discussed this with her in the office and this morning.    Physical Exam:   There were no vitals taken for this visit. There is no height or weight on file to calculate BMI.  Gen:  WDWN WF NAD  Neurological: Alert and oriented to person, place, and time. Motor and sensory function is grossly intact  Head: Normocephalic and atraumatic.  Eyes: Conjunctivae are normal. Pupils are equal, round, and reactive to light. No scleral icterus.  Neck: Normal range of motion. Neck supple. No tracheal deviation or thyromegaly present.  Cardiovascular:  SR without murmurs or gallops.  No carotid bruits Breast:  Not examined Respiratory: Effort normal.  No respiratory distress. No chest wall tenderness.  Breath sounds normal.  No wheezes, rales or rhonchi.  Abdomen:  Lower midline incision GU:  Not examined Musculoskeletal: Normal range of motion. Extremities are nontender. No cyanosis, edema or clubbing noted Lymphadenopathy: No cervical, preauricular, postauricular or axillary adenopathy is present Skin: Skin is warm and dry. No rash noted. No diaphoresis. No erythema. No pallor. Pscyh: Normal mood and affect. Behavior is normal. Judgment and thought content normal.   LABORATORY RESULTS: No results found for this or any previous visit (from the past 48 hour(s)).   RADIOLOGY RESULTS: No results found.  Problem List: Patient Active Problem List   Diagnosis Date Noted  . Postop check 12/12/2013  . Diverticulitis 10/31/2013  . Tobacco use 08/08/2013  . H/O diverticulitis of colon 08/08/2013  . Seizure ([facility]) 08/08/2013  . Tinea corporis 08/08/2013  . Screening for malignant neoplasm of the cervix 08/08/2013    Assessment & Plan: Morbid obesity and DM for roux en Y gastric bypass-possible sleeve gastrectomy    Matt B. Daphine Deutscher, MD, [facility] [phone] beeper [phone]  01/28/2020 6:35 PM

## 2020-01-29 ENCOUNTER — Inpatient Hospital Stay (HOSPITAL_COMMUNITY): Payer: Medicare Other | Admitting: Physician Assistant

## 2020-01-29 ENCOUNTER — Inpatient Hospital Stay (HOSPITAL_COMMUNITY): Payer: Medicare Other | Admitting: Anesthesiology

## 2020-01-29 ENCOUNTER — Encounter (HOSPITAL_COMMUNITY)
Admission: RE | Disposition: A | Discharge status: Home / Self Care | Payer: Self-pay | Source: Ambulatory Visit | Attending: Surgery

## 2020-01-29 ENCOUNTER — Inpatient Hospital Stay (HOSPITAL_COMMUNITY): Payer: Medicare Other

## 2020-01-29 ENCOUNTER — Encounter (HOSPITAL_COMMUNITY): Payer: Self-pay | Admitting: Surgery

## 2020-01-29 ENCOUNTER — Other Ambulatory Visit: Payer: Self-pay

## 2020-01-29 ENCOUNTER — Inpatient Hospital Stay (HOSPITAL_COMMUNITY)
Admission: RE | Admit: 2020-01-29 | Discharge: 2020-02-01 | DRG: 620 | Disposition: A | Discharge status: Home / Self Care | Payer: Medicare Other | Attending: Surgery | Admitting: Surgery

## 2020-01-29 DIAGNOSIS — Z7951 Long term (current) use of inhaled steroids: Secondary | ICD-10-CM

## 2020-01-29 DIAGNOSIS — Z9049 Acquired absence of other specified parts of digestive tract: Secondary | ICD-10-CM

## 2020-01-29 DIAGNOSIS — E119 Type 2 diabetes mellitus without complications: Secondary | ICD-10-CM | POA: Diagnosis present

## 2020-01-29 DIAGNOSIS — Z95828 Presence of other vascular implants and grafts: Secondary | ICD-10-CM

## 2020-01-29 DIAGNOSIS — F17210 Nicotine dependence, cigarettes, uncomplicated: Secondary | ICD-10-CM | POA: Diagnosis present

## 2020-01-29 DIAGNOSIS — Z91048 Other nonmedicinal substance allergy status: Secondary | ICD-10-CM

## 2020-01-29 DIAGNOSIS — F319 Bipolar disorder, unspecified: Secondary | ICD-10-CM | POA: Diagnosis present

## 2020-01-29 DIAGNOSIS — Z885 Allergy status to narcotic agent status: Secondary | ICD-10-CM

## 2020-01-29 DIAGNOSIS — Z79899 Other long term (current) drug therapy: Secondary | ICD-10-CM

## 2020-01-29 DIAGNOSIS — J45909 Unspecified asthma, uncomplicated: Secondary | ICD-10-CM | POA: Diagnosis present

## 2020-01-29 DIAGNOSIS — I10 Essential (primary) hypertension: Secondary | ICD-10-CM | POA: Diagnosis present

## 2020-01-29 DIAGNOSIS — K436 Other and unspecified ventral hernia with obstruction, without gangrene: Secondary | ICD-10-CM | POA: Diagnosis present

## 2020-01-29 DIAGNOSIS — G4730 Sleep apnea, unspecified: Secondary | ICD-10-CM | POA: Diagnosis present

## 2020-01-29 DIAGNOSIS — Z7984 Long term (current) use of oral hypoglycemic drugs: Secondary | ICD-10-CM

## 2020-01-29 DIAGNOSIS — Z452 Encounter for adjustment and management of vascular access device: Secondary | ICD-10-CM

## 2020-01-29 DIAGNOSIS — Z6841 Body Mass Index (BMI) 40.0 and over, adult: Secondary | ICD-10-CM | POA: Diagnosis not present

## 2020-01-29 DIAGNOSIS — Z888 Allergy status to other drugs, medicaments and biological substances status: Secondary | ICD-10-CM

## 2020-01-29 DIAGNOSIS — Z9884 Bariatric surgery status: Secondary | ICD-10-CM

## 2020-01-29 DIAGNOSIS — Z20822 Contact with and (suspected) exposure to covid-19: Secondary | ICD-10-CM | POA: Diagnosis present

## 2020-01-29 HISTORY — PX: GASTRIC ROUX-EN-Y: SHX5262

## 2020-01-29 LAB — COMPREHENSIVE METABOLIC PANEL
ALT: 35 U/L (ref 0–44)
AST: 19 U/L (ref 15–41)
Albumin: 3.7 g/dL (ref 3.5–5.0)
Alkaline Phosphatase: 94 U/L (ref 38–126)
Anion gap: 9 (ref 5–15)
BUN: 6 mg/dL (ref 6–20)
CO2: 25 mmol/L (ref 22–32)
Calcium: 8.8 mg/dL — ABNORMAL LOW (ref 8.9–10.3)
Chloride: 101 mmol/L (ref 98–111)
Creatinine, Ser: 0.53 mg/dL (ref 0.44–1.00)
GFR calc Af Amer: >60 mL/min (ref >60)
GFR calc non Af Amer: >60 mL/min (ref >60)
Glucose, Bld: 187 mg/dL — ABNORMAL HIGH (ref 70–99)
Potassium: 4 mmol/L (ref 3.5–5.1)
Sodium: 135 mmol/L (ref 135–145)
Total Bilirubin: 0.4 mg/dL (ref 0.3–1.2)
Total Protein: 7 g/dL (ref 6.5–8.1)

## 2020-01-29 LAB — ABO/RH: ABO/RH(D): B POS

## 2020-01-29 LAB — HEMOGLOBIN AND HEMATOCRIT, BLOOD
HCT: 51 % — ABNORMAL HIGH (ref 36.0–46.0)
Hemoglobin: 16.2 g/dL — ABNORMAL HIGH (ref 12.0–15.0)

## 2020-01-29 LAB — GLUCOSE, CAPILLARY
Glucose-Capillary: 224 mg/dL — ABNORMAL HIGH (ref 70–99)
Glucose-Capillary: 217 mg/dL — ABNORMAL HIGH (ref 70–99)
Glucose-Capillary: 261 mg/dL — ABNORMAL HIGH (ref 70–99)

## 2020-01-29 LAB — PREGNANCY, URINE: Preg Test, Ur: NEGATIVE

## 2020-01-29 LAB — TYPE AND SCREEN
ABO/RH(D): B POS
Antibody Screen: NEGATIVE

## 2020-01-29 SURGERY — LAPAROSCOPIC ROUX-EN-Y GASTRIC BYPASS WITH UPPER ENDOSCOPY
Anesthesia: General

## 2020-01-29 MED ORDER — CITALOPRAM HYDROBROMIDE 20 MG PO TABS
40.0000 mg | ORAL_TABLET | Freq: Every day | ORAL | Status: DC
  Administered 2020-01-29 – 2020-01-31 (×3): 40 mg via ORAL
  Filled 2020-01-29 (×3): qty 2

## 2020-01-29 MED ORDER — KCL IN DEXTROSE-NACL 20-5-0.45 MEQ/L-%-% IV SOLN
INTRAVENOUS | Status: DC
  Filled 2020-01-29: qty 1000

## 2020-01-29 MED ORDER — ONDANSETRON HCL 4 MG/2ML IJ SOLN
INTRAMUSCULAR | Status: AC
  Filled 2020-01-29: qty 2

## 2020-01-29 MED ORDER — TISSEEL VH 10 ML EX KIT
PACK | CUTANEOUS | Status: DC | PRN
  Administered 2020-01-29: 5 mL

## 2020-01-29 MED ORDER — FENTANYL CITRATE (PF) 100 MCG/2ML IJ SOLN
INTRAMUSCULAR | Status: AC
  Filled 2020-01-29: qty 2

## 2020-01-29 MED ORDER — ONDANSETRON HCL 4 MG/2ML IJ SOLN
INTRAMUSCULAR | Status: DC | PRN
  Administered 2020-01-29: 4 mg via INTRAVENOUS

## 2020-01-29 MED ORDER — LEVETIRACETAM 500 MG PO TABS: 500.0000 mg | ORAL_TABLET | Freq: Every day | ORAL | Status: DC

## 2020-01-29 MED ORDER — PROPOFOL 10 MG/ML IV BOLUS
INTRAVENOUS | Status: DC | PRN
  Administered 2020-01-29: 200 mg via INTRAVENOUS

## 2020-01-29 MED ORDER — LACTATED RINGERS IV SOLN: INTRAVENOUS | Status: DC | PRN

## 2020-01-29 MED ORDER — OXYCODONE HCL 5 MG/5ML PO SOLN
5.0000 mg | Freq: Four times a day (QID) | ORAL | Status: DC | PRN
  Administered 2020-01-29 – 2020-02-01 (×6): 5 mg via ORAL
  Filled 2020-01-29 (×7): qty 5

## 2020-01-29 MED ORDER — DEXAMETHASONE SODIUM PHOSPHATE 10 MG/ML IJ SOLN
INTRAMUSCULAR | Status: AC
  Filled 2020-01-29: qty 1

## 2020-01-29 MED ORDER — SUCCINYLCHOLINE CHLORIDE 200 MG/10ML IV SOSY
PREFILLED_SYRINGE | INTRAVENOUS | Status: DC | PRN
  Administered 2020-01-29: 70 mg via INTRAVENOUS

## 2020-01-29 MED ORDER — KCL IN DEXTROSE-NACL 20-5-0.45 MEQ/L-%-% IV SOLN
INTRAVENOUS | Status: AC
  Filled 2020-01-29: qty 1000

## 2020-01-29 MED ORDER — ALBUTEROL SULFATE HFA 108 (90 BASE) MCG/ACT IN AERS
INHALATION_SPRAY | RESPIRATORY_TRACT | Status: AC
  Filled 2020-01-29: qty 6.7

## 2020-01-29 MED ORDER — ALBUTEROL SULFATE (2.5 MG/3ML) 0.083% IN NEBU: 3.0000 mL | INHALATION_SOLUTION | Freq: Four times a day (QID) | RESPIRATORY_TRACT | Status: DC | PRN

## 2020-01-29 MED ORDER — GABAPENTIN 300 MG PO CAPS
300.0000 mg | ORAL_CAPSULE | ORAL | Status: AC
  Administered 2020-01-29: 08:00:00 300 mg via ORAL
  Filled 2020-01-29: qty 1

## 2020-01-29 MED ORDER — LAMOTRIGINE 100 MG PO TABS
200.0000 mg | ORAL_TABLET | Freq: Three times a day (TID) | ORAL | Status: DC
  Administered 2020-01-29 – 2020-02-01 (×8): 200 mg via ORAL
  Filled 2020-01-29 (×4): qty 2
  Filled 2020-01-29: qty 8
  Filled 2020-01-29 (×4): qty 2

## 2020-01-29 MED ORDER — APREPITANT 40 MG PO CAPS
40.0000 mg | ORAL_CAPSULE | ORAL | Status: AC
  Administered 2020-01-29: 08:00:00 40 mg via ORAL
  Filled 2020-01-29: qty 1

## 2020-01-29 MED ORDER — SUGAMMADEX SODIUM 500 MG/5ML IV SOLN
INTRAVENOUS | Status: DC | PRN
  Administered 2020-01-29: 500 mg via INTRAVENOUS

## 2020-01-29 MED ORDER — ROCURONIUM BROMIDE 10 MG/ML (PF) SYRINGE
PREFILLED_SYRINGE | INTRAVENOUS | Status: AC
  Filled 2020-01-29: qty 10

## 2020-01-29 MED ORDER — SUGAMMADEX SODIUM 500 MG/5ML IV SOLN
INTRAVENOUS | Status: AC
  Filled 2020-01-29: qty 5

## 2020-01-29 MED ORDER — FENTANYL CITRATE (PF) 250 MCG/5ML IJ SOLN
INTRAMUSCULAR | Status: AC
  Filled 2020-01-29: qty 5

## 2020-01-29 MED ORDER — ENSURE MAX PROTEIN PO LIQD
2.0000 [oz_av] | ORAL | Status: DC
  Administered 2020-01-30 – 2020-02-01 (×21): 2 [oz_av] via ORAL
  Filled 2020-01-29 (×20): qty 330

## 2020-01-29 MED ORDER — INSULIN ASPART 100 UNIT/ML ~~LOC~~ SOLN
0.0000 [IU] | SUBCUTANEOUS | Status: DC
  Administered 2020-01-30: 4 [IU] via SUBCUTANEOUS
  Administered 2020-01-30: 11 [IU] via SUBCUTANEOUS
  Administered 2020-01-30: 4 [IU] via SUBCUTANEOUS
  Administered 2020-01-30: 3 [IU] via SUBCUTANEOUS
  Administered 2020-01-30 (×2): 4 [IU] via SUBCUTANEOUS
  Administered 2020-01-30: 7 [IU] via SUBCUTANEOUS
  Administered 2020-01-31: 3 [IU] via SUBCUTANEOUS
  Administered 2020-01-31 (×4): 4 [IU] via SUBCUTANEOUS
  Administered 2020-02-01 (×2): 3 [IU] via SUBCUTANEOUS

## 2020-01-29 MED ORDER — METOPROLOL TARTRATE 5 MG/5ML IV SOLN
5.0000 mg | Freq: Four times a day (QID) | INTRAVENOUS | Status: DC | PRN
  Filled 2020-01-29: qty 5

## 2020-01-29 MED ORDER — 0.9 % SODIUM CHLORIDE (POUR BTL) OPTIME
TOPICAL | Status: DC | PRN
  Administered 2020-01-29 (×2): 1000 mL

## 2020-01-29 MED ORDER — ACETAMINOPHEN 500 MG PO TABS: 1000.0000 mg | ORAL_TABLET | Freq: Once | ORAL | Status: DC

## 2020-01-29 MED ORDER — OXYCODONE HCL 5 MG/5ML PO SOLN
ORAL | Status: AC
  Filled 2020-01-29: qty 5

## 2020-01-29 MED ORDER — LIDOCAINE HCL 2 % IJ SOLN
INTRAMUSCULAR | Status: AC
  Filled 2020-01-29: qty 20

## 2020-01-29 MED ORDER — LIDOCAINE 2% (20 MG/ML) 5 ML SYRINGE
INTRAMUSCULAR | Status: DC | PRN
  Administered 2020-01-29: 1.5 mg/kg/h via INTRAVENOUS

## 2020-01-29 MED ORDER — ACETAMINOPHEN 500 MG PO TABS
1000.0000 mg | ORAL_TABLET | Freq: Three times a day (TID) | ORAL | Status: DC
  Administered 2020-01-30 – 2020-02-01 (×6): 1000 mg via ORAL
  Filled 2020-01-29 (×7): qty 2

## 2020-01-29 MED ORDER — ORAL CARE MOUTH RINSE
15.0000 mL | Freq: Two times a day (BID) | OROMUCOSAL | Status: DC
  Administered 2020-01-30 – 2020-01-31 (×2): 15 mL via OROMUCOSAL

## 2020-01-29 MED ORDER — PANTOPRAZOLE SODIUM 40 MG IV SOLR
40.0000 mg | Freq: Every day | INTRAVENOUS | Status: DC
  Administered 2020-01-29 – 2020-01-31 (×3): 40 mg via INTRAVENOUS
  Filled 2020-01-29 (×3): qty 40

## 2020-01-29 MED ORDER — ONDANSETRON HCL 4 MG/2ML IJ SOLN: 4.0000 mg | INTRAMUSCULAR | Status: DC | PRN

## 2020-01-29 MED ORDER — KETAMINE HCL 10 MG/ML IJ SOLN
INTRAMUSCULAR | Status: AC
  Filled 2020-01-29: qty 1

## 2020-01-29 MED ORDER — SODIUM CHLORIDE (PF) 0.9 % IJ SOLN
INTRAMUSCULAR | Status: DC | PRN
  Administered 2020-01-29: 10 mL

## 2020-01-29 MED ORDER — ALBUTEROL SULFATE HFA 108 (90 BASE) MCG/ACT IN AERS
INHALATION_SPRAY | RESPIRATORY_TRACT | Status: DC | PRN
  Administered 2020-01-29: 5 via RESPIRATORY_TRACT

## 2020-01-29 MED ORDER — DEXAMETHASONE SODIUM PHOSPHATE 4 MG/ML IJ SOLN
INTRAMUSCULAR | Status: DC | PRN
  Administered 2020-01-29: 8 mg via INTRAVENOUS

## 2020-01-29 MED ORDER — SCOPOLAMINE 1 MG/3DAYS TD PT72
1.0000 | MEDICATED_PATCH | TRANSDERMAL | Status: DC
  Administered 2020-01-29: 08:00:00 1.5 mg via TRANSDERMAL

## 2020-01-29 MED ORDER — FENTANYL CITRATE (PF) 100 MCG/2ML IJ SOLN
25.0000 ug | INTRAMUSCULAR | Status: DC | PRN
  Administered 2020-01-29: 50 ug via INTRAVENOUS
  Administered 2020-01-29 (×2): 25 ug via INTRAVENOUS

## 2020-01-29 MED ORDER — PHENYLEPHRINE 40 MCG/ML (10ML) SYRINGE FOR IV PUSH (FOR BLOOD PRESSURE SUPPORT)
PREFILLED_SYRINGE | INTRAVENOUS | Status: DC | PRN
  Administered 2020-01-29 (×2): 80 ug via INTRAVENOUS
  Administered 2020-01-29 (×2): 120 ug via INTRAVENOUS

## 2020-01-29 MED ORDER — SODIUM CHLORIDE (PF) 0.9 % IJ SOLN
INTRAMUSCULAR | Status: AC
  Filled 2020-01-29: qty 10

## 2020-01-29 MED ORDER — LACTATED RINGERS IR SOLN
Status: DC | PRN
  Administered 2020-01-29: 1000 mL

## 2020-01-29 MED ORDER — ROCURONIUM BROMIDE 50 MG/5ML IV SOSY
PREFILLED_SYRINGE | INTRAVENOUS | Status: DC | PRN
  Administered 2020-01-29: 60 mg via INTRAVENOUS
  Administered 2020-01-29 (×3): 20 mg via INTRAVENOUS
  Administered 2020-01-29: 40 mg via INTRAVENOUS

## 2020-01-29 MED ORDER — SUCCINYLCHOLINE CHLORIDE 200 MG/10ML IV SOSY
PREFILLED_SYRINGE | INTRAVENOUS | Status: AC
  Filled 2020-01-29: qty 10

## 2020-01-29 MED ORDER — ACETAMINOPHEN 160 MG/5ML PO SOLN
1000.0000 mg | Freq: Three times a day (TID) | ORAL | Status: DC
  Administered 2020-01-29 – 2020-01-30 (×2): 1000 mg via ORAL
  Filled 2020-01-29 (×2): qty 40.6

## 2020-01-29 MED ORDER — HEPARIN SODIUM (PORCINE) 5000 UNIT/ML IJ SOLN
5000.0000 [IU] | Freq: Three times a day (TID) | INTRAMUSCULAR | Status: DC
  Administered 2020-01-29 – 2020-02-01 (×8): 5000 [IU] via SUBCUTANEOUS
  Filled 2020-01-29 (×8): qty 1

## 2020-01-29 MED ORDER — CHLORHEXIDINE GLUCONATE CLOTH 2 % EX PADS: 6.0000 | MEDICATED_PAD | Freq: Once | CUTANEOUS | Status: DC

## 2020-01-29 MED ORDER — CELECOXIB 200 MG PO CAPS
400.0000 mg | ORAL_CAPSULE | Freq: Once | ORAL | Status: AC
  Administered 2020-01-29: 400 mg via ORAL
  Filled 2020-01-29: qty 2

## 2020-01-29 MED ORDER — ACETAMINOPHEN 500 MG PO TABS
ORAL_TABLET | ORAL | Status: AC
  Filled 2020-01-29: qty 2

## 2020-01-29 MED ORDER — PROPOFOL 10 MG/ML IV BOLUS
INTRAVENOUS | Status: AC
  Filled 2020-01-29: qty 20

## 2020-01-29 MED ORDER — LEVETIRACETAM 500 MG PO TABS
500.0000 mg | ORAL_TABLET | Freq: Every day | ORAL | Status: DC
  Administered 2020-01-30 – 2020-02-01 (×3): 500 mg via ORAL
  Filled 2020-01-29 (×3): qty 1

## 2020-01-29 MED ORDER — MIDAZOLAM HCL 5 MG/5ML IJ SOLN
INTRAMUSCULAR | Status: DC | PRN
  Administered 2020-01-29: 2 mg via INTRAVENOUS

## 2020-01-29 MED ORDER — MIDAZOLAM HCL 2 MG/2ML IJ SOLN
INTRAMUSCULAR | Status: AC
  Filled 2020-01-29: qty 2

## 2020-01-29 MED ORDER — MOMETASONE FURO-FORMOTEROL FUM 200-5 MCG/ACT IN AERO
2.0000 | INHALATION_SPRAY | Freq: Two times a day (BID) | RESPIRATORY_TRACT | Status: DC
  Administered 2020-01-29 – 2020-02-01 (×6): 2 via RESPIRATORY_TRACT
  Filled 2020-01-29: qty 8.8

## 2020-01-29 MED ORDER — SODIUM CHLORIDE 0.9 % IV SOLN
2.0000 g | INTRAVENOUS | Status: AC
  Administered 2020-01-29: 11:00:00 2 g via INTRAVENOUS
  Filled 2020-01-29: qty 2

## 2020-01-29 MED ORDER — HEPARIN SODIUM (PORCINE) 5000 UNIT/ML IJ SOLN
5000.0000 [IU] | INTRAMUSCULAR | Status: AC
  Administered 2020-01-29: 5000 [IU] via SUBCUTANEOUS
  Filled 2020-01-29: qty 1

## 2020-01-29 MED ORDER — TISSEEL VH 10 ML EX KIT
PACK | CUTANEOUS | Status: AC
  Filled 2020-01-29: qty 2

## 2020-01-29 MED ORDER — CELECOXIB 200 MG PO CAPS
ORAL_CAPSULE | ORAL | Status: AC
  Filled 2020-01-29: qty 1

## 2020-01-29 MED ORDER — PHENYLEPHRINE 40 MCG/ML (10ML) SYRINGE FOR IV PUSH (FOR BLOOD PRESSURE SUPPORT)
PREFILLED_SYRINGE | INTRAVENOUS | Status: AC
  Filled 2020-01-29: qty 10

## 2020-01-29 MED ORDER — KETAMINE HCL 10 MG/ML IJ SOLN
INTRAMUSCULAR | Status: DC | PRN
  Administered 2020-01-29: 20 mg via INTRAVENOUS
  Administered 2020-01-29: 25 mg via INTRAVENOUS

## 2020-01-29 MED ORDER — BUPIVACAINE LIPOSOME 1.3 % IJ SUSP
INTRAMUSCULAR | Status: DC | PRN
  Administered 2020-01-29: 20 mL

## 2020-01-29 MED ORDER — SCOPOLAMINE 1 MG/3DAYS TD PT72
1.0000 | MEDICATED_PATCH | TRANSDERMAL | Status: DC
  Filled 2020-01-29: qty 1

## 2020-01-29 MED ORDER — ACETAMINOPHEN 500 MG PO TABS
1000.0000 mg | ORAL_TABLET | ORAL | Status: AC
  Administered 2020-01-29: 1000 mg via ORAL
  Filled 2020-01-29: qty 2

## 2020-01-29 MED ORDER — FENTANYL CITRATE (PF) 100 MCG/2ML IJ SOLN
INTRAMUSCULAR | Status: DC | PRN
  Administered 2020-01-29 (×3): 50 ug via INTRAVENOUS
  Administered 2020-01-29: 100 ug via INTRAVENOUS
  Administered 2020-01-29: 50 ug via INTRAVENOUS

## 2020-01-29 SURGICAL SUPPLY — 75 items
ADH SKN CLS APL DERMABOND .7 (GAUZE/BANDAGES/DRESSINGS) ×1
APL SRG 32X5 SNPLK LF DISP (MISCELLANEOUS) ×1
APL SWBSTK 6 STRL LF DISP (MISCELLANEOUS) ×1
APPLICATOR COTTON TIP 6 STRL (MISCELLANEOUS) ×1 IMPLANT
APPLICATOR COTTON TIP 6IN STRL (MISCELLANEOUS) ×2
APPLIER CLIP ROT 10 11.4 M/L (STAPLE)
APPLIER CLIP ROT 13.4 12 LRG (CLIP)
BINDER ABDOMINAL 12 ML 46-62 (SOFTGOODS) ×4 IMPLANT
BLADE SURG 15 STRL LF DISP TIS (BLADE) ×1 IMPLANT
BLADE SURG 15 STRL SS (BLADE) ×2
CABLE HIGH FREQUENCY MONO STRZ (ELECTRODE) IMPLANT
CLIP APPLIE ROT 10 11.4 M/L (STAPLE) IMPLANT
CLIP APPLIE ROT 13.4 12 LRG (CLIP) IMPLANT
CLIP SUT LAPRA TY ABSORB (SUTURE) ×4 IMPLANT
COVER WAND RF STERILE (DRAPES) IMPLANT
DERMABOND ADVANCED (GAUZE/BANDAGES/DRESSINGS) ×1
DERMABOND ADVANCED .7 DNX12 (GAUZE/BANDAGES/DRESSINGS) ×1 IMPLANT
DEVICE SUT QUICK LOAD TK 5 (STAPLE) IMPLANT
DEVICE SUT TI-KNOT TK 5X26 (MISCELLANEOUS) IMPLANT
DEVICE SUTURE ENDOST 10MM (ENDOMECHANICALS) ×2 IMPLANT
DISSECTOR BLUNT TIP ENDO 5MM (MISCELLANEOUS) IMPLANT
DRAIN PENROSE 0.25X18 (DRAIN) ×2 IMPLANT
DRSG OPSITE POSTOP 4X8 (GAUZE/BANDAGES/DRESSINGS) ×2 IMPLANT
DRSG TEGADERM 2-3/8X2-3/4 SM (GAUZE/BANDAGES/DRESSINGS) ×2 IMPLANT
GAUZE 4X4 16PLY RFD (DISPOSABLE) ×2 IMPLANT
GLOVE BIOGEL M 8.0 STRL (GLOVE) ×2 IMPLANT
GOWN STRL REUS W/TWL XL LVL3 (GOWN DISPOSABLE) ×6 IMPLANT
HANDLE STAPLE EGIA 4 XL (STAPLE) ×2 IMPLANT
HOVERMATT SINGLE USE (MISCELLANEOUS) ×2 IMPLANT
KIT BASIN OR (CUSTOM PROCEDURE TRAY) ×2 IMPLANT
KIT GASTRIC LAVAGE 34FR ADT (SET/KITS/TRAYS/PACK) ×2 IMPLANT
KIT TURNOVER KIT A (KITS) IMPLANT
MARKER SKIN DUAL TIP RULER LAB (MISCELLANEOUS) ×2 IMPLANT
NEEDLE SPNL 22GX3.5 QUINCKE BK (NEEDLE) ×2 IMPLANT
PACK CARDIOVASCULAR III (CUSTOM PROCEDURE TRAY) ×2 IMPLANT
PENCIL SMOKE EVACUATOR (MISCELLANEOUS) ×2 IMPLANT
RELOAD EGIA 45 MED/THCK PURPLE (STAPLE) ×2 IMPLANT
RELOAD EGIA 45 TAN VASC (STAPLE) IMPLANT
RELOAD EGIA 60 MED/THCK PURPLE (STAPLE) ×4 IMPLANT
RELOAD EGIA 60 TAN VASC (STAPLE) ×4 IMPLANT
RELOAD ENDO STITCH 2.0 (ENDOMECHANICALS) ×10
RELOAD TRI 45 ART MED THCK PUR (STAPLE) IMPLANT
RELOAD TRI 60 ART MED THCK PUR (STAPLE) ×4 IMPLANT
SCISSORS LAP 5X45 EPIX DISP (ENDOMECHANICALS) ×2 IMPLANT
SEALANT SURGICAL APPL DUAL CAN (MISCELLANEOUS) ×2 IMPLANT
SET IRRIG TUBING LAPAROSCOPIC (IRRIGATION / IRRIGATOR) ×2 IMPLANT
SET TUBE SMOKE EVAC HIGH FLOW (TUBING) ×2 IMPLANT
SHEARS HARMONIC ACE PLUS 45CM (MISCELLANEOUS) ×2 IMPLANT
SLEEVE ADV FIXATION 12X100MM (TROCAR) ×6 IMPLANT
SLEEVE ADV FIXATION 5X100MM (TROCAR) IMPLANT
SOL ANTI FOG 6CC (MISCELLANEOUS) ×1 IMPLANT
SOLUTION ANTI FOG 6CC (MISCELLANEOUS) ×1
SPONGE LAP 18X18 RF (DISPOSABLE) ×2 IMPLANT
STAPLER VISISTAT 35W (STAPLE) ×2 IMPLANT
SUT MNCRL AB 4-0 PS2 18 (SUTURE) ×6 IMPLANT
SUT NOVA 1 T20/GS 25DT (SUTURE) ×4 IMPLANT
SUT NOVA NAB DX-16 0-1 5-0 T12 (SUTURE) ×4 IMPLANT
SUT RELOAD ENDO STITCH 2 48X1 (ENDOMECHANICALS) ×6
SUT RELOAD ENDO STITCH 2.0 (ENDOMECHANICALS) ×4
SUT SURGIDAC NAB ES-9 0 48 120 (SUTURE) IMPLANT
SUT VIC AB 2-0 SH 27 (SUTURE) ×3
SUT VIC AB 2-0 SH 27X BRD (SUTURE) ×3 IMPLANT
SUTURE RELOAD END STTCH 2 48X1 (ENDOMECHANICALS) ×6 IMPLANT
SUTURE RELOAD ENDO STITCH 2.0 (ENDOMECHANICALS) ×4 IMPLANT
SYR 10ML ECCENTRIC (SYRINGE) ×2 IMPLANT
SYR 20ML LL LF (SYRINGE) ×4 IMPLANT
TOWEL OR 17X26 10 PK STRL BLUE (TOWEL DISPOSABLE) ×2 IMPLANT
TOWEL OR NON WOVEN STRL DISP B (DISPOSABLE) ×2 IMPLANT
TRAY FOLEY MTR SLVR 14FR STAT (SET/KITS/TRAYS/PACK) ×2 IMPLANT
TROCAR ADV FIXATION 12X100MM (TROCAR) ×2 IMPLANT
TROCAR ADV FIXATION 5X100MM (TROCAR) ×2 IMPLANT
TROCAR BLADELESS OPT 5 100 (ENDOMECHANICALS) ×2 IMPLANT
TROCAR XCEL 12X100 BLDLESS (ENDOMECHANICALS) ×2 IMPLANT
TUBING CONNECTING 10 (TUBING) ×2 IMPLANT
YANKAUER SUCT BULB TIP 10FT TU (MISCELLANEOUS) ×2 IMPLANT

## 2020-01-29 NOTE — Op Note (Signed)
29 January 2020 Surgeon: Susy Frizzle B. Daphine Deutscher, MD, FACS Asst:  Feliciana Rossetti MD, FACS, Gaynelle Adu, MD, FACS Anesthesia: General endotracheal Drains: None  Procedure: Laparoscopic Reduction of incarcerated lower midline hernia, Laparoscopic Roux en Y gastric bypass with 40 cm BP limb and 100 cm Roux limb, antecolic, antegastric, candy cane to the left. Marland Kitchen Upper endoscopy by Dr. Andrey Campanile, Lap assisted repair of 10 cm lower midline ventral hernia with primary closure using fig of 8 sutures of # 1 Novafil.    Description of Procedure:  The patient was taken to OR 1 at [facility] and given general anesthesia.  The abdomen was prepped with PCMX and draped sterilely.  A time out was performed.  Access to the abdomen was achieved with a 10 mm Optiview in the left upper quadrant.  Following insufflation the patient was noted to have a large amount of small bowel and omentum stuck and not adherent to a lower midline incisional hernia.  The primary patient had previously had a sigmoid colectomy by Dr. Lindie Spruce about 5 years ago with a wound infection and a hernia.  The omentum was taken down with harmonic scalpel and the bowel came out with the omentum.  This left a circular defect approximately 10 cm in diameter.  This was repaired at the end of the case.  Standard 10 mm trochars were placed.  Her habitus was a little more awkward because of her BMI and the amount of visceral fat that she had.  The operation began by identifying the ligament of Treitz. I measured 40 cm downstream and divided the bowel with a 6 cm Covidian stapler.  I sutured a Penrose drain along the Roux limb end.  I measured a 1 meter (100 cm) Roux limb and then placed the distal bowels to the BP limb side by side and performed a stapled jejunojejunostomy. The common defect was closed from either end with 4-0 Vicryl using the Endo Stitch. The mesenteric defect was closed with a running 2-0 silk using the Endo Stitch. Tisseel was applied to the suture line.  The  omentum was divided with the harmonic scalpel.  The Nathanson retractor was inserted in the left lateral segment of liver was retracted. The foregut dissection ensued.  We measured approximately 6 cm along the lesser curvature and entered retrogastric space.  I created a pouch with multiple firings of the 6 cm Davidian stapler using purple loads and on the first 2 without TRS in the the balance with the TRS reinforcement.  The Roux limb was then brought up with the candycane pointed left and a back row of sutures of 2-0 Vicryl were placed. I opened along the right side of each structure and inserted the 4.5 cm stapler to create the gastrojejunostomy. The common defect was closed from either end with 2-0 Vicryl and a second row was placed anterior to that the Ewald tube acting as a stent across the anastomosis. The Penrose drain was removed.   Endoscopy was performed by Dr. Andrey Campanile which showed about a 6 cm pouch with a patent anastomosis and there was no evidence of bubbles or bleeding.  The incisions were injected with an Exparel tap block 30 cc on either side placed at the first the case and the incisions in the upper abdomen  were closed with 4-0 Monocryl and Dermabond.  The lower midline incision was opened from the just below the umbilicus to the pubic area.  The old scar was excised.  I cut down to the  very tough hernia sac which I then stripped away from the surrounding tissue leaving the circular defect.  This was then approximated primarily with interrupted figure-of-eight sutures of #1 Novafil.  Excess sac was excised and then I sewed the sac down onto itself with a running 2-0 Vicryl.  I tacked down the surrounding tissue down into the wound to negate the empty space.  This area was irrigated and then closed with staples.  The patient was taken to the recovery room in satisfactory condition.  Matt B. Hassell Done, MD, FACS

## 2020-01-29 NOTE — Op Note (Signed)
ADAMARIE IZZO [id number] 10/14/1981 01/29/2020  Preoperative diagnosis: severe obesity  Postoperative diagnosis: Same   Procedure: Upper endoscopy   Surgeon: Atilano Ina M.D., FACS   Anesthesia: Gen.   Indications for procedure: 39 y.o. yo female undergoing a laparoscopic roux en y gastric bypass and an upper endoscopy was requested to evaluate the anastomosis.  Description of procedure: After we have completed the new gastrojejunostomy, I scrubbed out and obtained the Olympus endoscope. I gently placed endoscope in the patient's oropharynx and gently glided it down the esophagus without any difficulty under direct visualization. Once I was in the gastric pouch, I insufflated the pouch was air. The pouch was approximately 6.5 cm in size. I was able to cannulate and advanced the scope through the gastrojejunostomy. Dr.Martin had placed saline in the upper abdomen. Upon further insufflation of the gastric pouch there was no evidence of bubbles. Upon further inspection of the gastric pouch, the mucosa appeared normal. There is no evidence of any mucosal abnormality. The gastric pouch and Roux limb were decompressed. The width of the gastrojejunal anastomosis was 3 cm. The scope was withdrawn. The patient tolerated this portion of the procedure well. Please see Dr Ermalene Searing operative note for details regarding the laparoscopic roux-en-y gastric bypass.  Mary Sella. Andrey Campanile, MD, FACS General, Bariatric, & Minimally Invasive Surgery [facility], [place]

## 2020-01-29 NOTE — Anesthesia Procedure Notes (Signed)
Central Venous Catheter Insertion Performed by: Heather Roberts, MD, anesthesiologist Start/End2/15/2021 8:31 AM, 01/29/2020 8:41 AM Patient location: Pre-op. Preanesthetic checklist: patient identified, IV checked, site marked, risks and benefits discussed, surgical consent, monitors and equipment checked, pre-op evaluation, timeout performed and anesthesia consent Position: Trendelenburg Lidocaine 1% used for infiltration and patient sedated Hand hygiene performed , maximum sterile barriers used  and Seldinger technique used Catheter size: 8 Fr Total catheter length 16. Central line was placed.Double lumen Procedure performed using ultrasound guided technique. Ultrasound Notes:anatomy identified, needle tip was noted to be adjacent to the nerve/plexus identified, no ultrasound evidence of intravascular and/or intraneural injection and image(s) printed for medical record Attempts: 1 Following insertion, dressing applied, line sutured and Biopatch. Post procedure assessment: blood return through all ports, free fluid flow and no air  Patient tolerated the procedure well with no immediate complications.

## 2020-01-29 NOTE — Anesthesia Postprocedure Evaluation (Signed)
Anesthesia Post Note  Patient: Rachel [name]  Procedure(s) Performed: LAPAROSCOPIC ROUX-EN-Y GASTRIC BYPASS WITH UPPER ENDOSCOPY, REDUCTION of Incarcerated Hernia, Laproscopic Open Repair of Large Lower 10 cm hernia (N/A )     Patient location during evaluation: PACU Anesthesia Type: General Level of consciousness: awake and alert Pain management: pain level controlled Vital Signs Assessment: post-procedure vital signs reviewed and stable Respiratory status: nonlabored ventilation, respiratory function stable, spontaneous breathing and patient connected to face mask oxygen (Patient on BiPap) Cardiovascular status: blood pressure returned to baseline and stable Postop Assessment: no apparent nausea or vomiting Anesthetic complications: no    Last Vitals:  Vitals:   01/29/20 1815 01/29/20 1830  BP: (!) 150/87 (!) 148/85  Pulse: 93 93  Resp: (!) 25 (!) 23  Temp:    SpO2: 95% 93%    Last Pain:  Vitals:   01/29/20 1815  TempSrc:   PainSc: Asleep                 Beryle Lathe

## 2020-01-29 NOTE — Transfer of Care (Signed)
Immediate Anesthesia Transfer of Care Note  Patient: Rachel [name]  Procedure(s) Performed: LAPAROSCOPIC ROUX-EN-Y GASTRIC BYPASS WITH UPPER ENDOSCOPY, REDUCTION of Incarcerated Hernia, Laproscopic Open Repair of Large Lower 10 cm hernia (N/A )  Patient Location: PACU  Anesthesia Type:General  Level of Consciousness: drowsy and patient cooperative  Airway & Oxygen Therapy: Patient Spontanous Breathing and Patient connected to face mask  Post-op Assessment: Report given to RN and Post -op Vital signs reviewed and stable  Post vital signs: Reviewed and stable  Last Vitals:  Vitals Value Taken Time  BP 111/72 01/29/20 1534  Temp    Pulse    Resp 24 01/29/20 1535  SpO2    Vitals shown include unvalidated device data.  Last Pain:  Vitals:   01/29/20 0808  TempSrc:   PainSc: 0-No pain         Complications: No apparent anesthesia complications

## 2020-01-29 NOTE — Anesthesia Procedure Notes (Signed)
Procedure Name: Intubation Date/Time: 01/29/2020 10:48 AM Performed by: Deliah Boston, CRNA Pre-anesthesia Checklist: Patient identified, Emergency Drugs available, Suction available and Patient being monitored Patient Re-evaluated:Patient Re-evaluated prior to induction Oxygen Delivery Method: Circle system utilized Preoxygenation: Pre-oxygenation with 100% oxygen Induction Type: IV induction Ventilation: Mask ventilation without difficulty Laryngoscope Size: Mac and 3 Grade View: Grade I Tube type: Oral Tube size: 7.0 mm Number of attempts: 1 Airway Equipment and Method: Stylet and Oral airway Placement Confirmation: ETT inserted through vocal cords under direct vision,  positive ETCO2 and breath sounds checked- equal and bilateral Secured at: 21 cm Tube secured with: Tape Dental Injury: Teeth and Oropharynx as per pre-operative assessment

## 2020-01-29 NOTE — Discharge Instructions (Signed)
° ° ° °GASTRIC BYPASS/SLEEVE ° Home Care Instructions ° ° These instructions are to help you care for yourself when you go home. ° °Call: If you have any problems. °• Call [phone] and ask for the surgeon on call °• If you need immediate help, come to the ER at [place].  °• Tell the ER staff that you are a new post-op gastric bypass or gastric sleeve patient °  °Signs and symptoms to report: • Severe vomiting or nausea °o If you cannot keep down clear liquids for longer than 1 day, call your surgeon  °• Abdominal pain that does not get better after taking your pain medication °• Fever over 100.4° F with chills °• Heart beating over 100 beats a minute °• Shortness of breath at rest °• Chest pain °•  Redness, swelling, drainage, or foul odor at incision (surgical) sites °•  If your incisions open or pull apart °• Swelling or pain in calf (lower leg) °• Diarrhea (Loose bowel movements that happen often), frequent watery, uncontrolled bowel movements °• Constipation, (no bowel movements for 3 days) if this happens: Pick one °o Milk of Magnesia, 2 tablespoons by mouth, 3 times a day for 2 days if needed °o Stop taking Milk of Magnesia once you have a bowel movement °o Call your doctor if constipation continues °Or °o Miralax  (instead of Milk of Magnesia) following the label instructions °o Stop taking Miralax once you have a bowel movement °o Call your doctor if constipation continues °• Anything you think is not normal °  °Normal side effects after surgery: • Unable to sleep at night or unable to focus °• Irritability or moody °• Being tearful (crying) or depressed °These are common complaints, possibly related to your anesthesia medications that put you to sleep, stress of surgery, and change in lifestyle.  This usually goes away a few weeks after surgery.  If these feelings continue, call your primary care doctor. °  °Wound Care: You may have surgical glue, steri-strips, or staples over your incisions after  surgery °• Surgical glue:  Looks like a clear film over your incisions and will wear off a little at a time °• Steri-strips: Strips of tape over your incisions. You may notice a yellowish color on the skin under the steri-strips. This is used to make the   steri-strips stick better. Do not pull the steri-strips off - let them fall off °• Staples: Staples may be removed before you leave the hospital °o If you go home with staples, call [facility], [phone] at for an appointment with your surgeon’s nurse to have staples removed 10 days after surgery. °• Showering: You may shower two (2) days after your surgery unless your surgeon tells you differently °o Wash gently around incisions with warm soapy water, rinse well, and gently pat dry  °o No tub baths until staples are removed, steri-strips fall off or glue is gone.  °  °Medications: • Medications should be liquid or crushed if larger than the size of a dime °• Extended release pills (medication that release a little bit at a time through the day) should NOT be crushed or cut. (examples include XL, ER, DR, SR) °• Depending on the size and number of medications you take, you may need to space (take a few throughout the day)/change the time you take your medications so that you do not over-fill your pouch (smaller stomach) °• Make sure you follow-up with your primary care doctor to   make medication changes needed during rapid weight loss and life-style changes °• If you have diabetes, follow up with the doctor that orders your diabetes medication(s) within one week after surgery and check your blood sugar regularly. °• Do not drive while taking prescription pain medication  °• It is ok to take Tylenol by the bottle instructions with your pain medicine or instead of your pain medicine as needed.  DO NOT TAKE NSAIDS (EXAMPLES OF NSAIDS:  IBUPROFREN/ NAPROXEN)  °Diet:                    First 2 Weeks ° You will see the dietician t about two (2) weeks  after your surgery. The dietician will increase the types of foods you can eat if you are handling liquids well: °• If you have severe vomiting or nausea and cannot keep down clear liquids lasting longer than 1 day, call your surgeon @ ([phone]) °Protein Shake °• Drink at least 2 ounces of shake 5-6 times per day °• Each serving of protein shakes (usually 8 - 12 ounces) should have: °o 15 grams of protein  °o And no more than 5 grams of carbohydrate  °• Goal for protein each day: °o Men = 80 grams per day °o Women = 60 grams per day °• Protein powder may be added to fluids such as non-fat milk or Lactaid milk or unsweetened Soy/Almond milk (limit to 35 grams added protein powder per serving) ° °Hydration °• Slowly increase the amount of water and other clear liquids as tolerated (See Acceptable Fluids) °• Slowly increase the amount of protein shake as tolerated  °•  Sip fluids slowly and throughout the day.  Do not use straws. °• May use sugar substitutes in small amounts (no more than 6 - 8 packets per day; i.e. Splenda) ° °Fluid Goal °• The first goal is to drink at least 8 ounces of protein shake/drink per day (or as directed by the nutritionist); some examples of protein shakes are Syntrax Nectar, Adkins Advantage, EAS Edge HP, and Unjury. See handout from pre-op Bariatric Education Class: °o Slowly increase the amount of protein shake you drink as tolerated °o You may find it easier to slowly sip shakes throughout the day °o It is important to get your proteins in first °• Your fluid goal is to drink 64 - 100 ounces of fluid daily °o It may take a few weeks to build up to this °• 32 oz (or more) should be clear liquids  °And  °• 32 oz (or more) should be full liquids (see below for examples) °• Liquids should not contain sugar, caffeine, or carbonation ° °Clear Liquids: °• Water or Sugar-free flavored water (i.e. Fruit H2O, Propel) °• Decaffeinated coffee or tea (sugar-free) °• Crystal Lite, Wyler’s Lite,  Minute Maid Lite °• Sugar-free Jell-O °• Bouillon or broth °• Sugar-free Popsicle:   *Less than 20 calories each; Limit 1 per day ° °Full Liquids: °Protein Shakes/Drinks + 2 choices per day of other full liquids °• Full liquids must be: °o No More Than 15 grams of Carbs per serving  °o No More Than 3 grams of Fat per serving °• Strained low-fat cream soup (except Cream of Potato or Tomato) °• Non-Fat milk °• Fat-free Lactaid Milk °• Unsweetened Soy Or Unsweetened Almond Milk °• Low Sugar yogurt (Dannon Lite & Fit, Greek yogurt; Oikos Triple Zero; Chobani Simply 100; Yoplait 100 calorie Greek - No Fruit on the Bottom) ° °  °Vitamins   and Minerals • Start 1 day after surgery unless otherwise directed by your surgeon °• 2 Chewable Bariatric Specific Multivitamin / Multimineral Supplement with iron (Example: Bariatric Advantage Multi EA) °• Chewable Calcium with Vitamin D-3 °(Example: 3 Chewable Calcium Plus 600 with Vitamin D-3) °o Take 500 mg three (3) times a day for a total of 1500 mg each day °o Do not take all 3 doses of calcium at one time as it may cause constipation, and you can only absorb 500 mg  at a time  °o Do not mix multivitamins containing iron with calcium supplements; take 2 hours apart °• Menstruating women and those with a history of anemia (a blood disease that causes weakness) may need extra iron °o Talk with your doctor to see if you need more iron °• Do not stop taking or change any vitamins or minerals until you talk to your dietitian or surgeon °• Your Dietitian and/or surgeon must approve all vitamin and mineral supplements °  °Activity and Exercise: Limit your physical activity as instructed by your doctor.  It is important to continue walking at home.  During this time, use these guidelines: °• Do not lift anything greater than ten (10) pounds for at least two (2) weeks °• Do not go back to work or drive until your surgeon says you can °• You may have sex when you feel comfortable  °o It is  VERY important for female patients to use a reliable birth control method; fertility often increases after surgery  °o All hormonal birth control will be ineffective for 30 days after surgery due to medications given during surgery a barrier method must be used. °o Do not get pregnant for at least 18 months °• Start exercising as soon as your doctor tells you that you can °o Make sure your doctor approves any physical activity °• Start with a simple walking program °• Walk 5-15 minutes each day, 7 days per week.  °• Slowly increase until you are walking 30-45 minutes per day °Consider joining our BELT program. [phone] or email [email or web address] °  °Special Instructions Things to remember: °• Use your CPAP when sleeping if this applies to you ° °• [facility] has two free Bariatric Surgery Support Groups that meet monthly °o The 3rd Thursday of each month, 6 pm, [facility]  °o The 2nd Friday of each month, 11:45 am in the private dining room in the basement of [facility] °• It is very important to keep all follow up appointments with your surgeon, dietitian, primary care physician, and behavioral health practitioner °• Routine follow up schedule with your surgeon include appointments at 2-3 weeks, 6-8 weeks, 6 months, and 1 year at a minimum.  Your surgeon may request to see you more often.   °o After the first year, please follow up with your bariatric surgeon and dietitian at least once a year in order to maintain best weight loss results °[facility]: [phone] °[facility]: [phone] °Bariatric Nurse Coordinator: [phone] °  °   Reviewed and Endorsed  °by [facility] Patient Education Committee, June, 2016 °Edits Approved: Aug, 2018 ° ° ° °

## 2020-01-29 NOTE — Progress Notes (Signed)
PHARMACY CONSULT FOR:  Risk Assessment for Post-Discharge VTE Following Bariatric Surgery  Post-Discharge VTE Risk Assessment: This patient's probability of 30-day post-discharge VTE is increased due to the factors marked:   Female    Age >/=60 years    BMI >/=50 kg/m2    CHF    Dyspnea at Rest    Paraplegia  X  Non-gastric-band surgery  X  Operation Time >/=3 hr    Return to OR     Length of Stay >/= 3 d      Hx of VTE   Hypercoagulable condition   Significant venous stasis   Predicted probability of 30-day post-discharge VTE: 0.25%  Other patient-specific factors to consider: none  Recommendation for Discharge: No pharmacologic prophylaxis post-discharge  Rachel [name] is a 39 y.o. female who underwent laparoscopic Roux-en-Y gastric bypass 01/29/2020   Allergies  Allergen Reactions  . Hand Sanitizer [Ethyl Alcohol (Skin Cleanser)] Hives  . Codeine     HALLUCINATIONS  . Morphine And Related Itching  . Phenergan [Promethazine Hcl] Nausea And Vomiting    Patient Measurements: Height: 5\' 4"  (162.6 cm) IBW/kg (Calculated) : 54.7 Body mass index is 35.94 kg/m.  Recent Labs    01/29/20 0845  CREATININE 0.53  ALBUMIN 3.7  PROT 7.0  AST 19  ALT 35  ALKPHOS 94  BILITOT 0.4   Estimated Creatinine Clearance: 105.5 mL/min (by C-G formula based on SCr of 0.53 mg/dL).    Past Medical History:  Diagnosis Date  . Asthma   . Bipolar disorder (HCC)   . Depression   . Diabetes mellitus without complication (HCC)   . Diverticulosis   . Epilepsy (HCC)   . 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 Migraines    "haven't had one since I was a kid" (06/05/2013)  . Sleep apnea    CPAP     Medications Prior to Admission  Medication Sig Dispense Refill Last Dose  . atorvastatin (LIPITOR) 40 MG tablet Take 40 mg by mouth daily at 6 PM.   01/28/2020 at Unknown time  . canagliflozin (INVOKANA) 300 MG TABS tablet Take 300 mg by  mouth daily before breakfast.   Past Week at Unknown time  . citalopram (CELEXA) 40 MG tablet Take 1 tablet (40 mg total) by mouth daily. (Patient taking differently: Take 40 mg by mouth at bedtime. ) 30 tablet 2 01/28/2020 at Unknown time  . Fluticasone-Salmeterol (ADVAIR) 250-50 MCG/DOSE AEPB Inhale 1 puff into the lungs daily.    01/28/2020 at Unknown time  . lamoTRIgine (LAMICTAL) 200 MG tablet Take 200 mg by mouth 3 (three) times daily.    01/29/2020 at 0415  . levETIRAcetam (KEPPRA) 500 MG tablet Take 500 mg by mouth at bedtime.   01/29/2020 at 0415  . liraglutide 18 MG/3ML SOPN Inject 1.2 mg into the skin at bedtime.    Past Week at Unknown time  . losartan (COZAAR) 25 MG tablet Take 25 mg by mouth at bedtime.   01/28/2020 at Unknown time  . Multiple Vitamins-Minerals (BARIATRIC MULTIVITAMINS/IRON PO) Take 1 tablet by mouth daily.   01/28/2020 at Unknown time  . PROAIR HFA 108 (90 Base) MCG/ACT inhaler Inhale 1-2 puffs into the lungs every 6 (six) hours as needed for wheezing or shortness of breath.    01/29/2020 at 0745      01/31/2020, Pharm.D [id number] 01/29/2020 7:04 PM

## 2020-01-29 NOTE — Interval H&P Note (Signed)
History and Physical Interval Note:  01/29/2020 10:31 AM  Rachel [name]  has presented today for surgery, with the diagnosis of Morbid Obesity, DM, HTN.  The various methods of treatment have been discussed with the patient and family. After consideration of risks, benefits and other options for treatment, the patient has consented to  Procedure(s): LAPAROSCOPIC ROUX-EN-Y GASTRIC BYPASS WITH UPPER ENDOSCOPY, ERAS Pathway (N/A) as a surgical intervention.  I have also discussed sleeve gastrectomy and she agrees if roux en Y is not possible.  The patient's history has been reviewed, patient examined, no change in status, stable for surgery.  I have reviewed the patient's chart and labs.  Questions were answered to the patient's satisfaction.     Valarie Merino

## 2020-01-30 LAB — CBC WITH DIFFERENTIAL/PLATELET
Abs Immature Granulocytes: 0.31 10*3/uL — ABNORMAL HIGH (ref 0.00–0.07)
Basophils Absolute: 0 10*3/uL (ref 0.0–0.1)
Basophils Relative: 0 %
Eosinophils Absolute: 0 10*3/uL (ref 0.0–0.5)
Eosinophils Relative: 0 %
HCT: 48.8 % — ABNORMAL HIGH (ref 36.0–46.0)
Hemoglobin: 15.9 g/dL — ABNORMAL HIGH (ref 12.0–15.0)
Immature Granulocytes: 1 %
Lymphocytes Relative: 5 %
Lymphs Abs: 1.1 10*3/uL (ref 0.7–4.0)
MCH: 28.5 pg (ref 26.0–34.0)
MCHC: 32.6 g/dL (ref 30.0–36.0)
MCV: 87.5 fL (ref 80.0–100.0)
Monocytes Absolute: 1.4 10*3/uL — ABNORMAL HIGH (ref 0.1–1.0)
Monocytes Relative: 6 %
Neutro Abs: 19.8 10*3/uL — ABNORMAL HIGH (ref 1.7–7.7)
Neutrophils Relative %: 88 %
Platelets: 296 10*3/uL (ref 150–400)
RBC: 5.58 MIL/uL — ABNORMAL HIGH (ref 3.87–5.11)
RDW: 13.2 % (ref 11.5–15.5)
WBC: 22.7 10*3/uL — ABNORMAL HIGH (ref 4.0–10.5)
nRBC: 0 % (ref 0.0–0.2)

## 2020-01-30 LAB — GLUCOSE, CAPILLARY
Glucose-Capillary: 146 mg/dL — ABNORMAL HIGH (ref 70–99)
Glucose-Capillary: 160 mg/dL — ABNORMAL HIGH (ref 70–99)
Glucose-Capillary: 165 mg/dL — ABNORMAL HIGH (ref 70–99)
Glucose-Capillary: 173 mg/dL — ABNORMAL HIGH (ref 70–99)
Glucose-Capillary: 189 mg/dL — ABNORMAL HIGH (ref 70–99)
Glucose-Capillary: 217 mg/dL — ABNORMAL HIGH (ref 70–99)
Glucose-Capillary: 260 mg/dL — ABNORMAL HIGH (ref 70–99)

## 2020-01-30 LAB — BASIC METABOLIC PANEL
Anion gap: 8 (ref 5–15)
BUN: 7 mg/dL (ref 6–20)
CO2: 26 mmol/L (ref 22–32)
Calcium: 8.6 mg/dL — ABNORMAL LOW (ref 8.9–10.3)
Chloride: 102 mmol/L (ref 98–111)
Creatinine, Ser: 0.56 mg/dL (ref 0.44–1.00)
GFR calc Af Amer: >60 mL/min (ref >60)
GFR calc non Af Amer: >60 mL/min (ref >60)
Glucose, Bld: 167 mg/dL — ABNORMAL HIGH (ref 70–99)
Potassium: 3.8 mmol/L (ref 3.5–5.1)
Sodium: 136 mmol/L (ref 135–145)

## 2020-01-30 LAB — MRSA PCR SCREENING: MRSA by PCR: NEGATIVE

## 2020-01-30 MED ORDER — ENOXAPARIN (LOVENOX) PATIENT EDUCATION KIT
PACK | Freq: Once | Status: AC
  Filled 2020-01-30 (×2): qty 1

## 2020-01-30 MED ORDER — POTASSIUM CHLORIDE IN NACL 20-0.45 MEQ/L-% IV SOLN
INTRAVENOUS | Status: DC
  Filled 2020-01-30 (×5): qty 1000

## 2020-01-30 MED ORDER — MORPHINE SULFATE (PF) 2 MG/ML IV SOLN
1.0000 mg | INTRAVENOUS | Status: DC | PRN
  Administered 2020-01-30 (×3): 1 mg via INTRAVENOUS
  Filled 2020-01-30 (×3): qty 1

## 2020-01-30 NOTE — Progress Notes (Signed)
PHARMACY CONSULT FOR:  Risk Assessment for Post-Discharge VTE Following Bariatric Surgery  Post-Discharge VTE Risk Assessment: This patient's probability of 30-day post-discharge VTE is increased due to the factors marked:   Female    Age >/=60 years  X  BMI >/=50 kg/m2    CHF    Dyspnea at Rest    Paraplegia   X Non-gastric-band surgery   X Operation Time >/=3 hr    Return to OR     Length of Stay >/= 3 d      Hx of VTE   Hypercoagulable condition   Significant venous stasis   Predicted probability of 30-day post-discharge VTE: 0.42%  Other patient-specific factors to consider: 2/16 Discussion with [facility] [facility] RN. Noted weight in Epic was incorrect, looking back to 2/11: 285lb and 209 lb were both entered same day. Weight per [facility] was 290lb.  Updated flowsheet with correct weight, and re-calculated BMI > 49.77 > will use BMI of 50 for calculation.   Recommendation for Discharge: Enoxaparin 40 mg [place] q12h x 2 weeks post-discharge   Rachel [name] is a 39 y.o. 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 Hand Sanitizer [Ethyl Alcohol (Skin Cleanser)] Hives  . Chlorhexidine Other (See Comments)  . Codeine     HALLUCINATIONS  . Morphine And Related Itching  . Phenergan [Promethazine Hcl] Nausea And Vomiting    Patient Measurements: Height: 5\' 4"  (162.6 cm) Weight: 290 lb (131.5 kg) IBW/kg (Calculated) : 54.7 Body mass index is 49.78 kg/m.  Recent Labs    01/29/20 0845 01/29/20 1929 01/30/20 0249  WBC  --   --  22.7*  HGB  --  16.2* 15.9*  HCT  --  51.0* 48.8*  PLT  --   --  296  CREATININE 0.53  --   --   ALBUMIN 3.7  --   --   PROT 7.0  --   --   AST 19  --   --   ALT 35  --   --   ALKPHOS 94  --   --   BILITOT 0.4  --   --    Estimated Creatinine Clearance: 127.3 mL/min (by C-G formula based on SCr of 0.53 mg/dL).  Past Medical History:  Diagnosis Date   . Asthma   . Bipolar disorder ([place])   . Depression   . Diabetes mellitus without complication ([place])   . Diverticulosis   . Epilepsy ([place])   . Grand mal seizure [facility])    last seizure Sunday 01/24/17.anxiety precipitates seizure  . Migraines    "haven't had one since I was a kid" (06/05/2013)  . Sleep apnea    CPAP     Medications Prior to Admission  Medication Sig Dispense Refill Last Dose  . atorvastatin (LIPITOR) 40 MG tablet Take 40 mg by mouth daily at 6 PM.   01/28/2020 at Unknown time  . canagliflozin (INVOKANA) 300 MG TABS tablet Take 300 mg by mouth daily before breakfast.   Past Week at Unknown time  . citalopram (CELEXA) 40 MG tablet Take 1 tablet (40 mg total) by mouth daily. (Patient taking differently: Take 40 mg by mouth at bedtime. ) 30 tablet 2 01/28/2020 at Unknown time  . Fluticasone-Salmeterol (ADVAIR) 250-50 MCG/DOSE AEPB Inhale 1 puff into the lungs daily.    01/28/2020 at Unknown time  . lamoTRIgine (LAMICTAL) 200 MG tablet Take  200 mg by mouth 3 (three) times daily.    01/29/2020 at 0415  . levETIRAcetam (KEPPRA) 500 MG tablet Take 500 mg by mouth at bedtime.   01/29/2020 at 0415  . liraglutide 18 MG/3ML SOPN Inject 1.2 mg into the skin at bedtime.    Past Week at Unknown time  . losartan (COZAAR) 25 MG tablet Take 25 mg by mouth at bedtime.   01/28/2020 at Unknown time  . Multiple Vitamins-Minerals (BARIATRIC MULTIVITAMINS/IRON PO) Take 1 tablet by mouth daily.   01/28/2020 at Unknown time  . PROAIR HFA 108 (90 Base) MCG/ACT inhaler Inhale 1-2 puffs into the lungs every 6 (six) hours as needed for wheezing or shortness of breath.    01/29/2020 at 0745   Otho Bellows PharmD 01/30/2020,8:49 AM

## 2020-01-30 NOTE — Progress Notes (Signed)
Discussed plan of care with patient.  Will continue to monitor in step down with the goal of ambulating/chair time through out afternoon and evening.  Patient currently in chair after walking in hallway.  Protein started after completing clear fluid goals.  Denies nausea. Spouse at bedside.  Questions answered.

## 2020-01-30 NOTE — Progress Notes (Signed)
NUTRITION NOTE  Consult for Bariatric Diet Teaching (DROP). Bariatric nurse educator to provide information, including diet-related information, prior to d/c.   Patient is POD #1 lap Roux-en-Y gastric bypass with reduction of incarcerated hernia and lap open repair of large lower hernia.   If nutrition-related questions or concerns arise prior to d/c, please re-consult.    Trenton Gammon, MS, RD, LDN, CNSC Inpatient Clinical Dietitian RD pager # available in [facility]  After hours/weekend pager # available in [facility]

## 2020-01-30 NOTE — Progress Notes (Signed)
Patient alert and oriented, Post op day 1.  Provided support and encouragement.  Encouraged pulmonary toilet, ambulation and small sips of liquids. Patient with documented 45 ml of clear fluids, but patient indicates  tolerating water/ice.  No complaints of nausea, pain at right upper quadrant controlled.  Nasal cannula in place sats 94% per monitor. Will request bariatric recliner.  Above discussed with patient bedside RN.  All questions answered.  Will continue to monitor.

## 2020-01-31 LAB — CBC WITH DIFFERENTIAL/PLATELET
Abs Immature Granulocytes: 0.06 10*3/uL (ref 0.00–0.07)
Basophils Absolute: 0.1 10*3/uL (ref 0.0–0.1)
Basophils Relative: 0 %
Eosinophils Absolute: 0.1 10*3/uL (ref 0.0–0.5)
Eosinophils Relative: 1 %
HCT: 45.9 % (ref 36.0–46.0)
Hemoglobin: 14.7 g/dL (ref 12.0–15.0)
Immature Granulocytes: 0 %
Lymphocytes Relative: 16 %
Lymphs Abs: 2.3 10*3/uL (ref 0.7–4.0)
MCH: 28.3 pg (ref 26.0–34.0)
MCHC: 32 g/dL (ref 30.0–36.0)
MCV: 88.3 fL (ref 80.0–100.0)
Monocytes Absolute: 1 10*3/uL (ref 0.1–1.0)
Monocytes Relative: 7 %
Neutro Abs: 10.9 10*3/uL — ABNORMAL HIGH (ref 1.7–7.7)
Neutrophils Relative %: 76 %
Platelets: 277 10*3/uL (ref 150–400)
RBC: 5.2 MIL/uL — ABNORMAL HIGH (ref 3.87–5.11)
RDW: 13.2 % (ref 11.5–15.5)
WBC: 14.4 10*3/uL — ABNORMAL HIGH (ref 4.0–10.5)
nRBC: 0 % (ref 0.0–0.2)

## 2020-01-31 LAB — GLUCOSE, CAPILLARY
Glucose-Capillary: 158 mg/dL — ABNORMAL HIGH (ref 70–99)
Glucose-Capillary: 140 mg/dL — ABNORMAL HIGH (ref 70–99)
Glucose-Capillary: 152 mg/dL — ABNORMAL HIGH (ref 70–99)
Glucose-Capillary: 157 mg/dL — ABNORMAL HIGH (ref 70–99)
Glucose-Capillary: 124 mg/dL — ABNORMAL HIGH (ref 70–99)
Glucose-Capillary: 115 mg/dL — ABNORMAL HIGH (ref 70–99)

## 2020-01-31 MED ORDER — LOSARTAN POTASSIUM 25 MG PO TABS
25.0000 mg | ORAL_TABLET | Freq: Every day | ORAL | Status: DC
  Administered 2020-01-31: 22:00:00 25 mg via ORAL
  Filled 2020-01-31: qty 1

## 2020-01-31 NOTE — Progress Notes (Signed)
RN Zoe aware of DC Central line order

## 2020-01-31 NOTE — Plan of Care (Signed)
  Problem: Skin Integrity: Goal: Demonstration of wound healing without infection will improve Outcome: Progressing   Problem: Nutrition: Goal: Adequate nutrition will be maintained Outcome: Progressing

## 2020-01-31 NOTE — Progress Notes (Signed)
2 Days Post-Op   Subjective/Chief Complaint: Liquids going ok. No n/v Having a fair amount of lower abd discomfort from hernia repair - just like when dr wyatt fixed hernia.  Met criteria for extended dvt prophylaxis on dc Some HTN issues Some low o2 at times   Objective: Vital signs in last 24 hours: Temp:  [97.6 F (36.4 C)-98.2 F (36.8 C)] 97.8 F (36.6 C) (02/17 0800) Pulse Rate:  [81-94] 85 (02/17 0600) Resp:  [17-33] 31 (02/17 0600) BP: (137-178)/(54-89) 150/78 (02/17 0600) SpO2:  [89 %-97 %] 92 % (02/17 0845) Last BM Date: 01/28/20  Intake/Output from previous day: 02/16 0701 - 02/17 0700 In: 2027 [P.O.:655; I.V.:1372] Out: 400 [Urine:400] Intake/Output this shift: No intake/output data recorded.  Alert, nontoxic cta ant Reg Soft, obese, incisions ok. Some serosnag drainage on lower incision - approp ttp +SCDs  Lab Results:  Recent Labs    01/30/20 0249 01/31/20 0408  WBC 22.7* 14.4*  HGB 15.9* 14.7  HCT 48.8* 45.9  PLT 296 277   BMET Recent Labs    01/29/20 0845 01/30/20 1636  NA 135 136  K 4.0 3.8  CL 101 102  CO2 25 26  GLUCOSE 187* 167*  BUN 6 7  CREATININE 0.53 0.56  CALCIUM 8.8* 8.6*   PT/INR No results for input(s): LABPROT, INR in the last 72 hours. ABG No results for input(s): PHART, HCO3 in the last 72 hours.  Invalid input(s): PCO2, PO2  Studies/Results: DG Chest Port 1 View  Result Date: 01/29/2020 CLINICAL DATA:  Central line placement. EXAM: PORTABLE CHEST 1 VIEW COMPARISON:  PA and lateral chest 12/20/2018. FINDINGS: Right IJ central venous catheter is in place with the tip in the mid to lower superior vena cava. No pneumothorax. Lung volumes are low with crowding of the bronchovascular structures and atelectasis. Heart size is normal. IMPRESSION: Right IJ catheter tip projects in the mid to lower superior vena cava. No pneumothorax. Bibasilar airspace opacities are likely due to atelectasis in this low volume chest.  Electronically Signed   By: Inge Rise M.D.   On: 01/29/2020 16:22    Anti-infectives: Anti-infectives (From admission, onward)   Start     Dose/Rate Route Frequency Ordered Stop   01/29/20 0715  cefoTEtan (CEFOTAN) 2 g in sodium chloride 0.9 % 100 mL IVPB     2 g 200 mL/hr over 30 Minutes Intravenous On call to O.R. 01/29/20 0711 01/29/20 1043      Assessment/Plan: s/p Procedure(s): LAPAROSCOPIC ROUX-EN-Y GASTRIC BYPASS WITH UPPER ENDOSCOPY, REDUCTION of Incarcerated Hernia, Laproscopic Open Repair of Large Lower 10 cm hernia (N/A)  Doing as expected Not ready for d/c given pain from lower abdominal hernia repair/oxygen Wbc decreasing and no fever and no tachy - so I don't think she has leak Ok to transfer to floor Resume home BP med Dc central line after peripheral IV inserted Cont SSI for blood sugars Add flutter valve Ambulate Oral pain meds before IV pain meds Pt/ot eval lovenox teaching  Leighton Ruff. Redmond Pulling, MD, FACS General, Bariatric, & Minimally Invasive Surgery [facility] Surgery, [place]   LOS: 2 days    Greer Pickerel 01/31/2020

## 2020-01-31 NOTE — Progress Notes (Signed)
Transfer to 3 [place].  Vital signs stable, discussed plan of care with patient and bedside RN, Amil Amen.  Patient continues to drink fluids without difficulty.  Pain controlled.  Abdominal binder adjusted.  No questions at this time.  Will continue to monitor.

## 2020-01-31 NOTE — Progress Notes (Signed)
Patient alert and oriented, Post op day 2.  Provided support and encouragement.  Encouraged pulmonary toilet, ambulation and small sips of liquids.  Tolerating clear liquids and protein.  No complaints of nausea.  Walking in hallway, does note to RN short of breath when ambulating with oxygen on.  Continues to work on IS.  Discussed patient need for Lovenox at discharge.  All questions answered.  Will continue to monitor.

## 2020-01-31 NOTE — Evaluation (Signed)
Physical Therapy Evaluation Patient Details Name: Rachel [name] MRN: [id number] DOB: 10-05-1981 Today's Date: 01/31/2020   History of Present Illness  Patient is 39 y.o. female s/p lap Roux-en-Y gastric bypass with reduction of incarcerated hernia and lap open repair of large lower hernia. PMH significant for DM, epilepsy/seizures, obesity, bipolar disorder.    Clinical Impression  Rachel [name] is 39 y.o. female s/p lap Roux-en-Y gastric bypass on 01/29/20 with above HPI and diagnosis. Patient is currently limited by functional impairments below (see PT problem list). Patient lives with her husband and 2/3 children  and is independent at baseline. She is the primary caregiver for her husband and is the primary homemaker. She is most limited by pain. Patient will benefit from continued skilled PT interventions to address impairments and progress independence with mobility. Acute PT will follow and progress as able.     Follow Up Recommendations No PT follow up    Equipment Recommendations  (TBD)    Recommendations for Other Services       Precautions / Restrictions Precautions Precautions: Fall Precaution Comments: abdominal binder Restrictions Weight Bearing Restrictions: No      Mobility  Bed Mobility Overal bed mobility: Needs Assistance Bed Mobility: Supine to Sit;Sit to Supine     Supine to sit: HOB elevated;Supervision Sit to supine: HOB elevated;Supervision   General bed mobility comments: pt with extra effort and time to raise up and sit EOB, no assist required  Transfers Overall transfer level: Needs assistance Equipment used: Rolling walker (2 wheeled);None Transfers: Sit to/from Stand Sit to Stand: Min guard         General transfer comment: no assist required, guarding for safety. pt using UE's to initiate power up from EOB and toilet  Ambulation/Gait   Gait Distance (Feet): 200 Feet Assistive device: Rolling walker (2 wheeled);None Gait  Pattern/deviations: Wide base of support;Decreased stride length Gait velocity: decreased   General Gait Details: cues for safe proximity to RW throughout. no overt LOB noted. pt saturating around 90% on RA.  Stairs         Wheelchair Mobility    Modified Rankin (Stroke Patients Only)       Balance Overall balance assessment: Needs assistance Sitting-balance support: Feet supported Sitting balance-Leahy Scale: Good     Standing balance support: During functional activity;No upper extremity supported;Bilateral upper extremity supported Standing balance-Leahy Scale: Good          Pertinent Vitals/Pain Pain Assessment: 0-10 Pain Score: 6  Pain Location: pain abdomen - with getting up and down Pain Descriptors / Indicators: Discomfort Pain Intervention(s): Limited activity within patient's tolerance;Monitored during session;Repositioned    Home Living Family/patient expects to be discharged to:: Private residence Living Arrangements: Spouse/significant other;Children(husband and 3 children (24 y.o. is out of house)) Available Help at Discharge: Family Type of Home: House Home Access: Stairs to enter Entrance Stairs-Rails: Can reach both Entrance Stairs-Number of Steps: 13 Home Layout: One level Home Equipment: Shower seat - built in;Grab bars - tub/shower Additional Comments: pt is doing all homemaking and has been driving and grocery shopping    Prior Function Level of Independence: Independent         Comments: pt is primary caregiver for her husband (he was wounded in [place] and has severe PTSD). she has a friend who is a secondary caregiver when she is unable ("[place]").     Hand Dominance   Dominant Hand: Right    Extremity/Trunk Assessment   Upper Extremity Assessment Upper Extremity  Assessment: Overall WFL for tasks assessed    Lower Extremity Assessment Lower Extremity Assessment: Overall WFL for tasks assessed    Cervical / Trunk  Assessment Cervical / Trunk Assessment: Normal;Other exceptions Cervical / Trunk Exceptions: large body habitus  Communication   Communication: No difficulties  Cognition Arousal/Alertness: Awake/alert Behavior During Therapy: WFL for tasks assessed/performed Overall Cognitive Status: Within Functional Limits for tasks assessed         General Comments      Exercises     Assessment/Plan    PT Assessment Patient needs continued PT services  PT Problem List Decreased strength;Decreased balance;Decreased mobility;Decreased activity tolerance;Decreased knowledge of precautions;Decreased knowledge of use of DME       PT Treatment Interventions DME instruction;Functional mobility training;Balance training;Patient/family education;Therapeutic activities;Gait training;Stair training;Therapeutic exercise    PT Goals (Current goals can be found in the Care Plan section)  Acute Rehab PT Goals Patient Stated Goal: to get back to independent level and stop hurting PT Goal Formulation: With patient Time For Goal Achievement: 02/14/20 Potential to Achieve Goals: Good    Frequency Min 3X/week    AM-PAC PT "6 Clicks" Mobility  Outcome Measure Help needed turning from your back to your side while in a flat bed without using bedrails?: A Little Help needed moving from lying on your back to sitting on the side of a flat bed without using bedrails?: A Little Help needed moving to and from a bed to a chair (including a wheelchair)?: A Little Help needed standing up from a chair using your arms (e.g., wheelchair or bedside chair)?: A Little Help needed to walk in hospital room?: A Little Help needed climbing 3-5 steps with a railing? : A Little 6 Click Score: 18    End of Session Equipment Utilized During Treatment: Gait belt Activity Tolerance: Patient tolerated treatment well Patient left: with call bell/phone within reach;in bed;with bed alarm set Nurse Communication: Mobility  status PT Visit Diagnosis: Muscle weakness (generalized) (M62.81);Difficulty in walking, not elsewhere classified (R26.2)    Time: 2440-1027 PT Time Calculation (min) (ACUTE ONLY): 23 min   Charges:   PT Evaluation $PT Eval Low Complexity: 1 Low PT Treatments $Therapeutic Activity: 8-22 mins        Verner Mould, DPT Physical Therapist with [facility] [phone]  01/31/2020 5:48 PM

## 2020-02-01 LAB — GLUCOSE, CAPILLARY
Glucose-Capillary: 132 mg/dL — ABNORMAL HIGH (ref 70–99)
Glucose-Capillary: 123 mg/dL — ABNORMAL HIGH (ref 70–99)

## 2020-02-01 LAB — CBC
HCT: 43.5 % (ref 36.0–46.0)
Hemoglobin: 14.1 g/dL (ref 12.0–15.0)
MCH: 28.5 pg (ref 26.0–34.0)
MCHC: 32.4 g/dL (ref 30.0–36.0)
MCV: 87.9 fL (ref 80.0–100.0)
Platelets: 261 10*3/uL (ref 150–400)
RBC: 4.95 MIL/uL (ref 3.87–5.11)
RDW: 13.2 % (ref 11.5–15.5)
WBC: 13.2 10*3/uL — ABNORMAL HIGH (ref 4.0–10.5)
nRBC: 0 % (ref 0.0–0.2)

## 2020-02-01 LAB — BASIC METABOLIC PANEL
Anion gap: 9 (ref 5–15)
BUN: 6 mg/dL (ref 6–20)
CO2: 24 mmol/L (ref 22–32)
Calcium: 8.3 mg/dL — ABNORMAL LOW (ref 8.9–10.3)
Chloride: 103 mmol/L (ref 98–111)
Creatinine, Ser: 0.5 mg/dL (ref 0.44–1.00)
GFR calc Af Amer: >60 mL/min (ref >60)
GFR calc non Af Amer: >60 mL/min (ref >60)
Glucose, Bld: 141 mg/dL — ABNORMAL HIGH (ref 70–99)
Potassium: 3.9 mmol/L (ref 3.5–5.1)
Sodium: 136 mmol/L (ref 135–145)

## 2020-02-01 MED ORDER — ONDANSETRON 4 MG PO TBDP: 4.0000 mg | ORAL_TABLET | Freq: Four times a day (QID) | ORAL | 0 refills | Status: DC | PRN

## 2020-02-01 MED ORDER — PANTOPRAZOLE SODIUM 40 MG PO TBEC: 40.0000 mg | DELAYED_RELEASE_TABLET | Freq: Every day | ORAL | 0 refills | Status: DC

## 2020-02-01 MED ORDER — OXYCODONE HCL 5 MG PO TABS: 5.0000 mg | ORAL_TABLET | Freq: Four times a day (QID) | ORAL | 0 refills | Status: DC | PRN

## 2020-02-01 MED ORDER — ENOXAPARIN SODIUM 40 MG/0.4ML ~~LOC~~ SOLN: 40.0000 mg | Freq: Two times a day (BID) | SUBCUTANEOUS | 0 refills | Status: DC

## 2020-02-01 NOTE — Progress Notes (Signed)
Patient alert and oriented, Post op day 2.  Provided support and encouragement.  Encouraged pulmonary toilet, ambulation and small sips of liquids.  All questions answered.  Will continue to monitor. 

## 2020-02-01 NOTE — Progress Notes (Signed)
Patient alert and oriented, pain is controlled. Patient is tolerating fluids, advanced to protein shake today, patient is tolerating well. Reviewed Gastric Bypass discharge instructions with patient and patient is able to articulate understanding. Provided information on BELT program, Support Group and [facility] outpatient pharmacy. All questions answered, will continue to monitor.   Total fluid intake 720 Per dehydration protocol call back one week post op 

## 2020-02-01 NOTE — Plan of Care (Signed)
Resolved,  

## 2020-02-01 NOTE — Progress Notes (Signed)
PHARMACY CONSULT FOR:  Risk Assessment for Post-Discharge VTE Following Bariatric Surgery  Post-Discharge VTE Risk Assessment: This patient's probability of 30-day post-discharge VTE is increased due to the factors marked:   Female    Age >/=60 years  X  BMI >/=50 kg/m2    CHF    Dyspnea at Rest    Paraplegia   X Non-gastric-band surgery   X Operation Time >/=3 hr    Return to OR   x  Length of Stay >/= 3 d      Hx of VTE   Hypercoagulable condition   Significant venous stasis   Predicted probability of 30-day post-discharge VTE: 0.66%  Other patient-specific factors to consider: 2/16 Discussion with [facility] RN. Noted weight in Epic was incorrect, looking back to 2/11: 285lb and 209 lb were both entered same day. Weight per [facility] was 290lb.  Updated flowsheet with correct weight, and re-calculated BMI > 49.77 > will use BMI of 50 for calculation.   Recommendation for Discharge: Enoxaparin 40 mg [place] q12h x 2 weeks post-discharge   Rachel [name] is a 39 y.o. 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 Hand Sanitizer [Ethyl Alcohol (Skin Cleanser)] Hives  . Chlorhexidine Other (See Comments)  . Codeine     HALLUCINATIONS  . Morphine And Related Itching  . Phenergan [Promethazine Hcl] Nausea And Vomiting    Patient Measurements: Height: 5\' 4"  (162.6 cm) Weight: 290 lb (131.5 kg) IBW/kg (Calculated) : 54.7 Body mass index is 49.78 kg/m.  Recent Labs    01/29/20 0845 01/29/20 1929 01/30/20 0249 01/30/20 1636 01/31/20 0408 02/01/20 0425  WBC  --   --  22.7*  --  14.4* 13.2*  HGB  --    < > 15.9*  --  14.7 14.1  HCT  --    < > 48.8*  --  45.9 43.5  PLT  --   --  296  --  277 261  CREATININE 0.53  --   --  0.56  --  0.50  ALBUMIN 3.7  --   --   --   --   --   PROT 7.0  --   --   --   --   --   AST 19  --   --   --   --   --   ALT 35  --   --   --   --   --    ALKPHOS 94  --   --   --   --   --   BILITOT 0.4  --   --   --   --   --    < > = values in this interval not displayed.   Estimated Creatinine Clearance: 127.3 mL/min (by C-G formula based on SCr of 0.5 mg/dL).  Past Medical History:  Diagnosis Date  . Asthma   . Bipolar disorder ([place])   . Depression   . Diabetes mellitus without complication ([place])   . Diverticulosis   . Epilepsy ([place])   . Grand mal seizure [facility])    last seizure Sunday 01/24/17.anxiety precipitates seizure  . Migraines    "haven't had one since I was a kid" (06/05/2013)  . Sleep apnea    CPAP     Medications Prior to Admission  Medication Sig Dispense Refill Last Dose  . atorvastatin (LIPITOR) 40 MG  tablet Take 40 mg by mouth daily at 6 PM.   01/28/2020 at Unknown time  . canagliflozin (INVOKANA) 300 MG TABS tablet Take 300 mg by mouth daily before breakfast.   Past Week at Unknown time  . citalopram (CELEXA) 40 MG tablet Take 1 tablet (40 mg total) by mouth daily. (Patient taking differently: Take 40 mg by mouth at bedtime. ) 30 tablet 2 01/28/2020 at Unknown time  . Fluticasone-Salmeterol (ADVAIR) 250-50 MCG/DOSE AEPB Inhale 1 puff into the lungs daily.    01/28/2020 at Unknown time  . lamoTRIgine (LAMICTAL) 200 MG tablet Take 200 mg by mouth 3 (three) times daily.    01/29/2020 at 0415  . levETIRAcetam (KEPPRA) 500 MG tablet Take 500 mg by mouth at bedtime.   01/29/2020 at 0415  . liraglutide 18 MG/3ML SOPN Inject 1.2 mg into the skin at bedtime.    Past Week at Unknown time  . losartan (COZAAR) 25 MG tablet Take 25 mg by mouth at bedtime.   01/28/2020 at Unknown time  . Multiple Vitamins-Minerals (BARIATRIC MULTIVITAMINS/IRON PO) Take 1 tablet by mouth daily.   01/28/2020 at Unknown time  . PROAIR HFA 108 (90 Base) MCG/ACT inhaler Inhale 1-2 puffs into the lungs every 6 (six) hours as needed for wheezing or shortness of breath.    01/29/2020 at 0745   Arley Phenix RPh 02/01/2020, 7:26 AM

## 2020-02-01 NOTE — Discharge Summary (Signed)
Physician Discharge Summary  Patient ID: Rachel [name] MRN: [id number] DOB/AGE: 05-23-1981 39 y.o.  PCP: Fayrene Helper, NP  Admit date: 01/29/2020 Discharge date: 02/01/2020  Admission Diagnoses:  Morbid obesity, ventral hernia, DM  Discharge Diagnoses:  same  Active Problems:   History of Roux-en-Y gastric bypass   Surgery:  Lap roux en Y gastric bypass with repair of ventral hernia  Discharged Condition: improved  Hospital Course:   Had surgery and carried to step down to work on breathing.  Repair of hernia likely impacted work of breathing.  She was able to come to the med surgery floor on PD 2 and then was doing well and ready for discharge home on PD 3.  Consults: pharmacy  Significant Diagnostic Studies: none    Discharge Exam: Blood pressure 123/69, pulse 73, temperature 98.2 F (36.8 C), temperature source Oral, resp. rate 18, height 5\' 4"  (1.626 m), weight 131.5 kg, SpO2 96 %. Incisions upper with Dermabond and Lower midline incision closed with staples.    Disposition: Discharge disposition: 01-Home or Self Care       Discharge Instructions    Ambulate hourly while awake   Complete by: As directed    Call MD for:  difficulty breathing, headache or visual disturbances   Complete by: As directed    Call MD for:  persistant dizziness or light-headedness   Complete by: As directed    Call MD for:  persistant nausea and vomiting   Complete by: As directed    Call MD for:  redness, tenderness, or signs of infection (pain, swelling, redness, odor or green/yellow discharge around incision site)   Complete by: As directed    Call MD for:  severe uncontrolled pain   Complete by: As directed    Call MD for:  temperature >101 F   Complete by: As directed    Diet bariatric full liquid   Complete by: As directed    Incentive spirometry   Complete by: As directed    Perform hourly while awake     Allergies as of 02/01/2020      Reactions   Hand Sanitizer  [ethyl Alcohol (skin Cleanser)] Hives   Chlorhexidine Other (See Comments)   Codeine    HALLUCINATIONS   Morphine And Related Itching   Phenergan [promethazine Hcl] Nausea And Vomiting      Medication List    STOP taking these medications   liraglutide 18 MG/3ML Sopn Commonly known as: VICTOZA     TAKE these medications   atorvastatin 40 MG tablet Commonly known as: LIPITOR Take 40 mg by mouth daily at 6 PM.   BARIATRIC MULTIVITAMINS/IRON PO Take 1 tablet by mouth daily.   citalopram 40 MG tablet Commonly known as: CELEXA Take 1 tablet (40 mg total) by mouth daily. What changed: when to take this   enoxaparin 40 MG/0.4ML injection Commonly known as: LOVENOX Inject 0.4 mLs (40 mg total) into the skin every 12 (twelve) hours for 28 doses.   Fluticasone-Salmeterol 250-50 MCG/DOSE Aepb Commonly known as: ADVAIR Inhale 1 puff into the lungs daily.   Invokana 300 MG Tabs tablet Generic drug: canagliflozin Take 300 mg by mouth daily before breakfast. Notes to patient: Monitor Blood Sugar Frequently and keep a log for primary care physician, you may need to adjust medication dosage with rapid weight loss.     lamoTRIgine 200 MG tablet Commonly known as: LAMICTAL Take 200 mg by mouth 3 (three) times daily.   levETIRAcetam 500 MG  tablet Commonly known as: KEPPRA Take 500 mg by mouth at bedtime.   losartan 25 MG tablet Commonly known as: COZAAR Take 25 mg by mouth at bedtime. Notes to patient: Monitor Blood Pressure Daily and keep a log for primary care physician.  You may need to make changes to your medications with rapid weight loss.     ondansetron 4 MG disintegrating tablet Commonly known as: ZOFRAN-ODT Take 1 tablet (4 mg total) by mouth every 6 (six) hours as needed for nausea or vomiting.   oxyCODONE 5 MG immediate release tablet Commonly known as: Oxy IR/ROXICODONE Take 1 tablet (5 mg total) by mouth every 6 (six) hours as needed for severe pain.    pantoprazole 40 MG tablet Commonly known as: PROTONIX Take 1 tablet (40 mg total) by mouth daily.   ProAir HFA 108 (90 Base) MCG/ACT inhaler Generic drug: albuterol Inhale 1-2 puffs into the lungs every 6 (six) hours as needed for wheezing or shortness of breath.      Follow-up Information    Sur[facility], [place]. Go on 02/22/2020.   Specialty: General Surgery Why: at 1130 with Dr Johnathan Hausen.  Please arrive 15 minutes prior to appointment.  Thank you. Contact information: [address] [place] [address] [phone]        Surgery, [place] .   Specialty: General Surgery Contact information: [address] [place] [address] [phone]           Signed: Pedro Earls 02/01/2020, 9:23 AM

## 2020-02-01 NOTE — Evaluation (Signed)
Occupational Therapy Evaluation Patient Details Name: Rachel [name] MRN: [id number] DOB: [birth year] Today's Date: 02/01/2020    History of Present Illness Patient is 39 y.o. female s/p lap Roux-en-Y gastric bypass with reduction of incarcerated hernia and lap open repair of large lower hernia. PMH significant for DM, epilepsy/seizures, obesity, bipolar disorder.   Clinical Impression   Pt was admitted for the above. At baseline, she is independent with adls, iadls, driving.  Husband and 2 children at home can assist as needed. All education was completed. No further OT is needed at this time    Follow Up Recommendations  No OT follow up    Equipment Recommendations  None recommended by OT(has high commode with 2 surfaces to push from)    Recommendations for Other Services       Precautions / Restrictions Precautions Precautions: Fall Precaution Comments: abdominal binder(current 2 don't fit:  RN ordering smaller size) Restrictions Weight Bearing Restrictions: No      Mobility Bed Mobility               General bed mobility comments: oob  Transfers Overall transfer level: Modified independent Equipment used: Rolling walker (2 wheeled);None                  Balance                                           ADL either performed or assessed with clinical judgement   ADL Overall ADL's : Needs assistance/impaired Eating/Feeding: Independent   Grooming: Wash/dry hands;Modified independent   Upper Body Bathing: Modified independent   Lower Body Bathing: Minimal assistance   Upper Body Dressing : Modified independent   Lower Body Dressing: Moderate assistance   Toilet Transfer: Modified Independent   Toileting- Clothing Manipulation and Hygiene: Modified independent         General ADL Comments: pt up with RW.  Performed dressing with assist for feet, socks, shoes. Pt is very independent natured.  Husband and children will help at  home. She verbalizes not to lift anything and to not push beyond pain     Vision         Perception     Praxis      Pertinent Vitals/Pain Pain Assessment: Faces Faces Pain Scale: Hurts even more Pain Location: pain abdomen - with getting up and down Pain Descriptors / Indicators: Discomfort Pain Intervention(s): Limited activity within patient's tolerance;Monitored during session;Repositioned     Hand Dominance     Extremity/Trunk Assessment Upper Extremity Assessment Upper Extremity Assessment: Overall WFL for tasks assessed           Communication     Cognition Arousal/Alertness: Awake/alert Behavior During Therapy: WFL for tasks assessed/performed Overall Cognitive Status: Within Functional Limits for tasks assessed                                     General Comments       Exercises     Shoulder Instructions      Home Living                                          Prior Functioning/Environment  OT Problem List:        OT Treatment/Interventions:      OT Goals(Current goals can be found in the care plan section) Acute Rehab OT Goals Patient Stated Goal: to get back to independent level and stop hurting OT Goal Formulation: All assessment and education complete, DC therapy  OT Frequency:     Barriers to D/C:            Co-evaluation              AM-PAC OT "6 Clicks" Daily Activity     Outcome Measure Help from another person eating meals?: None Help from another person taking care of personal grooming?: None Help from another person toileting, which includes using toliet, bedpan, or urinal?: None Help from another person bathing (including washing, rinsing, drying)?: A Little Help from another person to put on and taking off regular upper body clothing?: None Help from another person to put on and taking off regular lower body clothing?: A Little 6 Click Score: 22   End of  Session    Activity Tolerance: Patient tolerated treatment well Patient left: with family/visitor present(room bench)  OT Visit Diagnosis: Muscle weakness (generalized) (M62.81)                Time: 1000-1023 OT Time Calculation (min): 23 min Charges:  OT General Charges $OT Visit: 1 Visit OT Evaluation $OT Eval Low Complexity: 1 Low  Yuya Vanwingerden S, OTR/L Acute Rehabilitation Services 02/01/2020  [place] 02/01/2020, 10:35 AM

## 2020-02-05 ENCOUNTER — Telehealth (HOSPITAL_COMMUNITY): Payer: Self-pay

## 2020-02-05 NOTE — Telephone Encounter (Addendum)
Spoke with patient spouse when called patient back.  He indicated Kathalene was in the bed resting, contacted surgeons office due to pain at hernia incision.  States she is drinking getting protein, taking lovenox injection.  Asked to have patient call back [facility] office for further follow up to below questions.   1.  Tell me about your pain and pain management?  2.  Let's talk about fluid intake.  How much total fluid are you taking in?  3.  How much protein have you taken in the last 2 days?  4.  Have you had nausea?  Tell me about when have experienced nausea and what you did to help?  5.  Has the frequency or color changed with your urine?  6.  Tell me what your incisions look like?  7.  Have you been passing gas? BM?  8.  If a problem or question were to arise who would you call?  Do you know contact numbers for [facility], [facility], and [facility]?  9.  How has the walking going?  10.  How are your vitamins and calcium going?  How are you taking them?

## 2020-02-12 ENCOUNTER — Encounter: Payer: Self-pay | Admitting: Dietician

## 2020-02-12 ENCOUNTER — Encounter: Payer: Medicare Other | Attending: Surgery | Admitting: Dietician

## 2020-02-12 ENCOUNTER — Other Ambulatory Visit: Payer: Self-pay

## 2020-02-12 DIAGNOSIS — Z820 Family history of epilepsy and other diseases of the nervous system: Secondary | ICD-10-CM | POA: Diagnosis not present

## 2020-02-12 DIAGNOSIS — Z79899 Other long term (current) drug therapy: Secondary | ICD-10-CM | POA: Insufficient documentation

## 2020-02-12 DIAGNOSIS — E119 Type 2 diabetes mellitus without complications: Secondary | ICD-10-CM | POA: Insufficient documentation

## 2020-02-12 DIAGNOSIS — E282 Polycystic ovarian syndrome: Secondary | ICD-10-CM | POA: Insufficient documentation

## 2020-02-12 DIAGNOSIS — Z8249 Family history of ischemic heart disease and other diseases of the circulatory system: Secondary | ICD-10-CM | POA: Insufficient documentation

## 2020-02-12 DIAGNOSIS — G4733 Obstructive sleep apnea (adult) (pediatric): Secondary | ICD-10-CM | POA: Insufficient documentation

## 2020-02-12 DIAGNOSIS — Z6841 Body Mass Index (BMI) 40.0 and over, adult: Secondary | ICD-10-CM

## 2020-02-12 DIAGNOSIS — Z888 Allergy status to other drugs, medicaments and biological substances status: Secondary | ICD-10-CM | POA: Insufficient documentation

## 2020-02-12 DIAGNOSIS — Z833 Family history of diabetes mellitus: Secondary | ICD-10-CM | POA: Diagnosis not present

## 2020-02-12 DIAGNOSIS — Z713 Dietary counseling and surveillance: Secondary | ICD-10-CM | POA: Insufficient documentation

## 2020-02-12 DIAGNOSIS — E669 Obesity, unspecified: Secondary | ICD-10-CM | POA: Insufficient documentation

## 2020-02-12 NOTE — Progress Notes (Signed)
Follow-up visit:  2 Weeks Post-Operative sleeve gastrectomy Surgery  Medical Nutrition Therapy:  Appt start time: 1330 end time:  1400.  Primary concerns today: Post-operative Bariatric Surgery Nutrition Management.   Learning Readiness:   Change in progress  Weight: 272.9lbs Height: 5'4" Weight at previous [facility] visit: 280.5lbs 06/02/19; highest pre-op weight 290lbs  Patient unable to complete body comp measurement as she is unable to remove socks and shoes due to abdominal discomfort.   Progress:  Weight loss of about 17lbs since surgery. Patient denies any GI distress other than abdominal soreness from incisions.   Patient continues to smoke about 1 pack cigarettes daily. She is aware of the increased risk of GI ulcers from smoking.   She reports craving for zucchini, salad, banana.   She is monitoring fluid and protein intake.   Dietary recall: Breakfast: protein shake 2-3oz at a time Snack: protein shake  Lunch: broth Snack: sugar free jello; broth  Dinner: protein shake; has tried scallops, fish Snack: protein shake  Fluid intake: water, sugar free flavored water, protein shakes -- 60-70oz daily Estimated total protein intake: 60grams  Medications: atorvastatin, canagliflozin, citalopram, enoxaparin, fluticasone-salmeterol inhaler, lamoTRIgine, levETIRAcetam, losartan, pantoprazole,  Supplementation: bariatric multivitamin 2x daily; calcium 2x daily  Using straws: no Drinking while eating: no Hair loss: no Carbonated beverages: no N/V/D/C: constipation, took miralax once and now normal Dumping syndrome: no   Recent physical activity:  Some light walking  Progress Towards Goal(s):  In progress.  Handouts given during visit include:  Phase 3 and 4 bariatric diet handouts   Nutritional Diagnosis:  [place]-3.3 Overweight/obesity As related to history of excess calories and inactivity.  As evidenced by patient with current BMI of 49.8, following bariatric diet  guidelines for ongoing weight loss after bariatric surgery.    Intervention:    Reviewed progress since surgery.  Discussed dietary advancement and importance of allowing time for healing in order to tolerate more foods and food groups, including vegetables. Discussed importance of strictly limiting sugar and fat intake.   Instructed patient on options for adding protein into foods/ fluids to maintain goal intake.   Advised reducing protein shakes only when able to increase consumption of solid protein foods to 3 or more ounces daily.   Advised avoiding fruits, nuts  until at least 3-4 months after surgery.   Teaching Method Utilized:  Visual Auditory Hands on  Barriers to learning/adherence to lifestyle change: none  Demonstrated degree of understanding via:  Teach Back   Monitoring/Evaluation:  Dietary intake, exercise, and body weight.     Follow up 03/25/20 at 1:15pm for 2 month post-op visit.

## 2020-02-12 NOTE — Patient Instructions (Addendum)
   Continue to increase solid protein foods and aim for 1-2oz with each meal.   Remember to follow rules for avoiding fluids during meals, taking small bites, chewing thoroughly, etc.   Great job meeting protein and fluid goals.

## 2020-02-24 ENCOUNTER — Other Ambulatory Visit: Payer: Self-pay

## 2020-02-24 ENCOUNTER — Ambulatory Visit: Payer: Medicare Other | Attending: Internal Medicine

## 2020-02-24 DIAGNOSIS — Z23 Encounter for immunization: Secondary | ICD-10-CM

## 2020-02-24 NOTE — Progress Notes (Signed)
   Covid-19 Vaccination Clinic  Name:  [name]    MRN: [id number] DOB: 12-Apr-1981  02/24/2020  Rachel [name] was observed post Covid-19 immunization for 15 minutes without incident. She was provided with Vaccine Information Sheet and instruction to access the V-Safe system.   Rachel [name] was instructed to call 911 with any severe reactions post vaccine: Marland Kitchen Difficulty breathing  . Swelling of face and throat  . A fast heartbeat  . A bad rash all over body  . Dizziness and weakness   Immunizations Administered    Name Date Dose VIS Date Route   Pfizer COVID-19 Vaccine 02/24/2020  1:11 PM 0.3 mL 11/24/2019 Intramuscular   Manufacturer: [employer], [employer]   Lot: [id number]   NDC: [address]-4

## 2020-03-19 ENCOUNTER — Ambulatory Visit: Payer: Medicare Other | Attending: Internal Medicine

## 2020-03-19 DIAGNOSIS — Z23 Encounter for immunization: Secondary | ICD-10-CM

## 2020-03-19 NOTE — Progress Notes (Signed)
   Covid-19 Vaccination Clinic  Name:  Rachel [name]    MRN: [id number] DOB: 1981/05/25  03/19/2020  Ms. [name] was observed post Covid-19 immunization for 15 minutes without incident. She was provided with Vaccine Information Sheet and instruction to access the V-Safe system.   [name] was instructed to call 911 with any severe reactions post vaccine: Marland Kitchen Difficulty breathing  . Swelling of face and throat  . A fast heartbeat  . A bad rash all over body  . Dizziness and weakness   Immunizations Administered    Name Date Dose VIS Date Route   Pfizer COVID-19 Vaccine 03/19/2020  1:54 PM 0.3 mL 11/24/2019 Intramuscular   Manufacturer: [employer], [employer]   Lot: [id number]   NDC: [address]-4

## 2020-03-25 ENCOUNTER — Encounter: Payer: Medicare Other | Attending: Surgery | Admitting: Dietician

## 2020-03-25 ENCOUNTER — Encounter: Payer: Self-pay | Admitting: Dietician

## 2020-03-25 ENCOUNTER — Other Ambulatory Visit: Payer: Self-pay

## 2020-03-25 VITALS — Ht 64.0 in | Wt 259.4 lb

## 2020-03-25 DIAGNOSIS — Z820 Family history of epilepsy and other diseases of the nervous system: Secondary | ICD-10-CM | POA: Diagnosis not present

## 2020-03-25 DIAGNOSIS — E282 Polycystic ovarian syndrome: Secondary | ICD-10-CM | POA: Diagnosis not present

## 2020-03-25 DIAGNOSIS — G4733 Obstructive sleep apnea (adult) (pediatric): Secondary | ICD-10-CM | POA: Diagnosis not present

## 2020-03-25 DIAGNOSIS — Z833 Family history of diabetes mellitus: Secondary | ICD-10-CM | POA: Diagnosis not present

## 2020-03-25 DIAGNOSIS — Z888 Allergy status to other drugs, medicaments and biological substances status: Secondary | ICD-10-CM | POA: Insufficient documentation

## 2020-03-25 DIAGNOSIS — Z713 Dietary counseling and surveillance: Secondary | ICD-10-CM | POA: Diagnosis present

## 2020-03-25 DIAGNOSIS — Z6841 Body Mass Index (BMI) 40.0 and over, adult: Secondary | ICD-10-CM | POA: Diagnosis not present

## 2020-03-25 DIAGNOSIS — E669 Obesity, unspecified: Secondary | ICD-10-CM | POA: Diagnosis not present

## 2020-03-25 DIAGNOSIS — Z8249 Family history of ischemic heart disease and other diseases of the circulatory system: Secondary | ICD-10-CM | POA: Diagnosis not present

## 2020-03-25 DIAGNOSIS — Z79899 Other long term (current) drug therapy: Secondary | ICD-10-CM | POA: Diagnosis not present

## 2020-03-25 DIAGNOSIS — E119 Type 2 diabetes mellitus without complications: Secondary | ICD-10-CM | POA: Diagnosis not present

## 2020-03-25 NOTE — Progress Notes (Signed)
Follow-up visit:  2 Months Post-Operative Gastric Bypass Surgery  Medical Nutrition Therapy:  Appt start time: 1315 end time:  1400.  Primary concerns today: Post-operative Bariatric Surgery Nutrition Management.   Learning Readiness:   Change in progress  Weight: 259.4lbs Height: 5 4  Weight at previous [facility] visit: 272.9lbs Body Composition: Skeletal Muscle mass: 65.3lbs; Body fat: 54.4%; loss of 10lbs body fat since visit on 06/02/2019    Progress:  Weigh loss of 13.5lbs over the past 6 weeks. Both fat mass and muscle mass have decreased.  Patient tried eggs but became nauseated; does not like eggs anyway.  Working out best support with family; ie not eating sweets in front of her. Patient reports having a taste of something sweet recently but now tasted too sweet for her.  She reports taking small bites and chewing foods thoroughly. She continues to have a protein shake for breakfast because other breakfast foods don't work well for her.   Dietary recall: Breakfast: protein shake  Snack: none or same as pm  Lunch: often salad with 2-3oz chicken/ salmon/ flank steak; 4/11 teriyaki chicken sandwich (ate small piece of bun), tried 1/2 stalk celery with small amount peanut butter but did not settle well Snack: has tried a few nuts without issue; string cheese (1% fat); chicken salad Dinner: 2-3oz scallops/ crab legs/ shrimp/ salmon/ chicken/ steak/ burger patty + carrots/ green beans/ zucchini Snack: sugar free popsicle; 3-4 pork rinds  Fluid intake: water, sugar free flavored water, 1 protein shake daily -- >64oz daily per patient Estimated total protein intake: 55-70g daily  Medications: atorvastatin, citalopram, enoxaparin, Advair inhaler, lamoTRIgine, levETIRAcetam, pantroprazole, Proair inhaler   Supplementation: bariatric multivitamin 2x daily + calcium 2x daily  Using straws: no Drinking while eating: no Hair loss: no Carbonated beverages: no N/V/D/C: occasional  nausea after trying "new" food; one episode of constipation x4 days resolved with miralax Dumping syndrome: no   Recent physical activity:  Contraindicated at this time due to open incision site  Progress Towards Goal(s):  In progress.  Handouts given during visit include:  Phase 5 and 6 bariatric diet handouts  Goals and Instructions (AVS)   Nutritional Diagnosis:  [place]-3.3 Overweight/obesity As related to history of excess calories and inactivity.  As evidenced by patient with current BMI of 44.5, following bariatric diet guidelines for ongoing weight loss after gastric bypass surgery.    Intervention:    Reviewed progress since previous visit.  Patient continues to strictly limit carbohydrate intake and is meeting goals for protein and fluid.  She will increase physical activity once incision site has healed.   She will plan to return for in-person visit at 6 months post-op, but will feel better with additional RD contact sooner; RD will call patient at 4 months post-op to check on progress.  Teaching Method Utilized:  Visual Auditory Hands on  Barriers to learning/adherence to lifestyle change: none  Demonstrated degree of understanding via:  Teach Back   Monitoring/Evaluation:  Dietary intake, exercise, and body weight. Follow up 07/25/20 at 1:15pm for 6 month post-op visit.

## 2020-03-25 NOTE — Patient Instructions (Signed)
   Continue with current eating pattern, great job following diet guidelines!  OK to include small amounts of peas, sweet potato, or fruit along with protein for meals or snacks. Keep carbs <15g at a time until able to begin regular exercise.

## 2020-07-25 ENCOUNTER — Ambulatory Visit: Payer: Medicare Other | Admitting: Dietician

## 2020-08-29 ENCOUNTER — Telehealth: Payer: Self-pay | Admitting: Dietician

## 2020-08-29 NOTE — Telephone Encounter (Signed)
Called patient to check on progress and reschedule her missed appointment from 07/25/20. She reports doing well with bariatric diet and weight loss, states she has lost a total of 115lbs so far. She rescheduled to 09/04/20 at 9:00am.

## 2020-09-04 ENCOUNTER — Encounter: Payer: Self-pay | Admitting: Dietician

## 2020-09-04 ENCOUNTER — Other Ambulatory Visit: Payer: Self-pay

## 2020-09-04 ENCOUNTER — Encounter: Payer: Medicare Other | Attending: Surgery | Admitting: Dietician

## 2020-09-04 VITALS — Ht 64.0 in | Wt 199.1 lb

## 2020-09-04 DIAGNOSIS — Z6841 Body Mass Index (BMI) 40.0 and over, adult: Secondary | ICD-10-CM | POA: Insufficient documentation

## 2020-09-04 DIAGNOSIS — Z713 Dietary counseling and surveillance: Secondary | ICD-10-CM | POA: Diagnosis not present

## 2020-09-04 DIAGNOSIS — Z6834 Body mass index (BMI) 34.0-34.9, adult: Secondary | ICD-10-CM

## 2020-09-04 DIAGNOSIS — E669 Obesity, unspecified: Secondary | ICD-10-CM

## 2020-09-04 NOTE — Patient Instructions (Signed)
   Increase protein intake to at least 60 grams daily. Monitor protein intake by keeping a food journal for 1-2 weeks, or use an app such as MyFitnessPal or Baritastic.   Great job making healthy food choices!!  Your regular exercise will definitely help with long tern success, keep it up!

## 2020-09-04 NOTE — Progress Notes (Signed)
Follow-up visit:  7 Months Post-Operative Gastric Bypass Surgery  Medical Nutrition Therapy:  Appt start time: 0900 end time:  0930.  Primary concerns today: Post-operative Bariatric Surgery Nutrition Management.   Learning Readiness:   Change in progress  Weight: 199.1lbs Height: 5'4"  Date 09/04/20 03/25/20 06/02/19  BMI 34.2 44.3 48.1  Weight (lbs) 199.1 259.4 280.5  Skeletal muscle (lbs) 56.4 65.3 72.8  % body fat 47.9 54.4 54.0      Progress: . Patient reports feeling well physically and is pleased with her weight loss progress; she states she has lost over 100lbs since her highest weight of over 300lbs pre-op.  . She is adhering to her diet closely; has tried some grains/ starches. Kept to 1 bite of cake no icing on her birthday, no other sweets at all.  . She has continued to increase physical activity and is now planning to add some strength training.  . She does report significant hair loss as well as constipation.   Dietary recall: Breakfast: premier protein shake -- waits until hungry before eating again Snack: 12-1pm grapes or carrots or walnuts Lunch: 2pm-- chicken salad with 2-3 wheat thins or triscuits; [place] and 1/2 string cheese; small pc flatbread sandwich (1 sandwich makes several servings); burger on lettuce or 1/2 bun Snack: none  Dinner: 4-5pm-- sometimes not hungry so does not eat, or snacks on fruit or veg; 3 shrimp + zucchini; chicken or seafood + veg Snack: low sugar yogurt (does not like [place]); peanut butter on a saltine maybe once a week Makes sure to have 1 shake + meat +/or cheese 2 more times in the day  Fluid intake: avg 6x 32oz bottles water daily (192oz); 4oz milk 2-3 times a week; protein shake 1 daily Estimated total protein intake: about 50g  Medications: atorvastatin, canagliflozin, citalopram, Advair, lamoTRIgine, levETIRAcetam, losartan, pantoprazole, Proair inhaler Supplementation: bariatric multivitamin 1x daily; calcium carbonate 2-3  times daily  Using straws: yes, does not cause any issues Drinking while eating: no Hair loss: yes, significant per patient Carbonated beverages: none N/V/D/C: one episode of vomiting after eating few bites of soft pretzel; does have constipation -- takes stool softener +/or laxative  Dumping syndrome: no   Recent physical activity:  Walking 40-60 minutes daily (2.2 miles); occasional water exercise  Progress Towards Goal(s):  In progress.  Handouts given during visit include:  Goals and instructions   Nutritional Diagnosis:  [place]-3.3 Overweight/obesity As related to history of excess calories and inactivity.  As evidenced by patient with current BMI of 34, following bariatric diet for ongoing weight loss after gastric bypass surgery.    Intervention:   . Reviewed progress since previous visit on 03/25/20. Marland Kitchen Discussed possible causes of constipation and hair loss.  . Advised increasing protein intake to at least 60 grams daily. Encouraged tracking protein intake. Marland Kitchen Updated goals with input from patient.  . She will call to schedule next [facility] follow-up.  Teaching Method Utilized:  Visual Auditory Hands on  Barriers to learning/adherence to lifestyle change: none  Demonstrated degree of understanding via:  Teach Back   Monitoring/Evaluation:  Dietary intake, exercise, and body weight. Follow up in 2-4 months for 9-12 month post-op visit.

## 2020-10-03 ENCOUNTER — Other Ambulatory Visit: Payer: Self-pay | Admitting: Nurse Practitioner

## 2020-10-03 DIAGNOSIS — Z1231 Encounter for screening mammogram for malignant neoplasm of breast: Secondary | ICD-10-CM

## 2020-10-09 ENCOUNTER — Other Ambulatory Visit: Payer: Medicare Other

## 2020-10-09 ENCOUNTER — Encounter: Payer: Medicare Other | Admitting: Internal Medicine

## 2020-10-11 ENCOUNTER — Inpatient Hospital Stay: Payer: Medicare Other

## 2020-10-11 ENCOUNTER — Encounter (INDEPENDENT_AMBULATORY_CARE_PROVIDER_SITE_OTHER): Payer: Self-pay

## 2020-10-11 ENCOUNTER — Other Ambulatory Visit: Payer: Self-pay

## 2020-10-11 ENCOUNTER — Inpatient Hospital Stay: Payer: Medicare Other | Attending: Internal Medicine | Admitting: Internal Medicine

## 2020-10-11 ENCOUNTER — Encounter: Payer: Self-pay | Admitting: Internal Medicine

## 2020-10-11 VITALS — BP 113/79 | HR 81 | Temp 97.6°F | Resp 16 | Ht 64.0 in | Wt 191.0 lb

## 2020-10-11 DIAGNOSIS — D751 Secondary polycythemia: Secondary | ICD-10-CM | POA: Diagnosis present

## 2020-10-11 DIAGNOSIS — Z9884 Bariatric surgery status: Secondary | ICD-10-CM | POA: Diagnosis not present

## 2020-10-11 DIAGNOSIS — R233 Spontaneous ecchymoses: Secondary | ICD-10-CM | POA: Diagnosis not present

## 2020-10-11 DIAGNOSIS — D729 Disorder of white blood cells, unspecified: Secondary | ICD-10-CM | POA: Diagnosis present

## 2020-10-11 DIAGNOSIS — F17210 Nicotine dependence, cigarettes, uncomplicated: Secondary | ICD-10-CM | POA: Diagnosis not present

## 2020-10-11 NOTE — Progress Notes (Signed)
[facility] CONSULT NOTE  Patient Care Team: Fayrene Helper, NP as PCP - General (Nurse Practitioner)  CHIEF COMPLAINTS/PURPOSE OF CONSULTATION: Leucocytosis/ Erythrocytosis   HEMATOLOGY HISTORY  #  ERTHROCYTOSIS/ LEUCOCYTOSIS  [platelets-Normal; Hb-16.2;; white count-12.5-neutrophilia/lymphocytosis].   # GASTRIC BYPASS [FEB 2021; [facility], [place]]  # Seizures [in 30s-controlled]; Biploar; smoker/CPAP    HISTORY OF PRESENTING ILLNESS:  Rachel [name] 39 y.o.  female pleasant patient was been referred to [place] for further evaluation of elevated white count/Hemoglobin which was incidentally found on blood work.   Patient lost more than 125 pounds status post gastric bypass.  She denies any fevers or chills.  Any night sweats.  No lumps or bumps.  Patient has noted blood in stools about 2-3 times a week for the last 2 to 3 months.  Patient had previous upper endoscopy but did not have colonoscopy.  She also complains of petechial type of rash on her arms.  No easy bruising or bleeding.   Review of Systems  Constitutional: Positive for malaise/fatigue. Negative for chills, diaphoresis, fever and weight loss.  HENT: Negative for nosebleeds and sore throat.   Eyes: Negative for double vision.  Respiratory: Negative for cough, hemoptysis, sputum production, shortness of breath and wheezing.   Cardiovascular: Negative for chest pain, palpitations, orthopnea and leg swelling.  Gastrointestinal: Positive for blood in stool. Negative for abdominal pain, constipation, diarrhea, heartburn, melena, nausea and vomiting.  Genitourinary: Negative for dysuria, frequency and urgency.  Musculoskeletal: Negative for back pain and joint pain.  Skin: Positive for itching. Negative for rash.  Neurological: Negative for dizziness, tingling, focal weakness, weakness and headaches.  Endo/Heme/Allergies: Does not bruise/bleed easily.  Psychiatric/Behavioral: Negative for depression. The patient is not  nervous/anxious and does not have insomnia.      MEDICAL HISTORY:  Past Medical History:  Diagnosis Date  . Asthma   . Bipolar disorder (HCC)   . Depression   . Diabetes mellitus without complication (HCC)   . Diverticulosis   . Epilepsy (HCC)   . 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)    last seizure Sunday 01/24/17.anxiety precipitates seizure  . Migraines    "haven't had one since I was a kid" (06/05/2013)  . Sleep apnea    CPAP    SURGICAL HISTORY: Past Surgical History:  Procedure Laterality Date  . CARPAL TUNNEL RELEASE Left 2011?  . CARPAL TUNNEL RELEASE Right 01/27/2017   Procedure: CARPAL TUNNEL RELEASE;  Surgeon: Erin Sons, MD;  Location: [facility] ORS;  Service: Orthopedics;  Laterality: Right;  . COLON SURGERY  2014   bowel resection/colostomy  . COLOSTOMY REVISION N/A 11/07/2013   Procedure: COLON RESECTION SIGMOID;  Surgeon: [name], MD;  Location: [facility] OR;  Service: General;  Laterality: N/A;  . ctr Left   . GASTRIC ROUX-EN-Y N/A 01/29/2020   Procedure: LAPAROSCOPIC ROUX-EN-Y GASTRIC BYPASS WITH UPPER ENDOSCOPY, REDUCTION of Incarcerated Hernia, Laproscopic Open Repair of Large Lower 10 cm hernia;  Surgeon: Luretha Murphy, MD;  Location: [facility] ORS;  Service: General;  Laterality: N/A;    SOCIAL HISTORY: Social History   Socioeconomic History  . Marital status: Married    Spouse name: Not on file  . Number of children: Not on file  . Years of education: Not on file  . Highest education level: Not on file  Occupational History  . Not on file  Tobacco Use  . Smoking status: Current Every Day Smoker    Packs/day: 1.00    Years: 18.00  Pack years: 18.00    Types: Cigarettes  . Smokeless tobacco: Never Used  Vaping Use  . Vaping Use: Never used  Substance and Sexual Activity  . Alcohol use: No  . Drug use: No  . Sexual activity: Yes    Birth control/protection: I.U.D.  Other Topics Concern  . Not on file  Social History Narrative  . Not on file   Social  Determinants of Health   Financial Resource Strain:   . Difficulty of Paying Living Expenses: Not on file  Food Insecurity:   . Worried About Programme researcher, broadcasting/film/video in the Last Year: Not on file  . Ran Out of Food in the Last Year: Not on file  Transportation Needs:   . Lack of Transportation (Medical): Not on file  . Lack of Transportation (Non-Medical): Not on file  Physical Activity:   . Days of Exercise per Week: Not on file  . Minutes of Exercise per Session: Not on file  Stress:   . Feeling of Stress : Not on file  Social Connections:   . Frequency of Communication with Friends and Family: Not on file  . Frequency of Social Gatherings with Friends and Family: Not on file  . Attends Religious Services: Not on file  . Active Member of Clubs or Organizations: Not on file  . Attends Banker Meetings: Not on file  . Marital Status: Not on file  Intimate Partner Violence:   . Fear of Current or Ex-Partner: Not on file  . Emotionally Abused: Not on file  . Physically Abused: Not on file  . Sexually Abused: Not on file    FAMILY HISTORY: Family History  Problem Relation Age of Onset  . Cancer Maternal Grandfather        prostate  . Cancer Maternal Aunt        breast    ALLERGIES:  is allergic to hand sanitizer [ethyl alcohol (skin cleanser)], chlorhexidine, codeine, morphine and related, and phenergan [promethazine hcl].  MEDICATIONS:  Current Outpatient Medications  Medication Sig Dispense Refill  . Ascorbic Acid (VITAMIN C) 100 MG tablet Take 100 mg by mouth daily.    Marland Kitchen atorvastatin (LIPITOR) 40 MG tablet Take 40 mg by mouth daily at 6 PM.    . Calcium Carbonate (CALCIUM 500 PO) Take by mouth. 2 times daily    . canagliflozin (INVOKANA) 300 MG TABS tablet Take 300 mg by mouth daily before breakfast.    . citalopram (CELEXA) 40 MG tablet Take 1 tablet (40 mg total) by mouth daily. (Patient taking differently: Take 40 mg by mouth at bedtime. ) 30 tablet 2  .  Ferrous Sulfate (IRON PO) Take by mouth.    . Fluticasone-Salmeterol (ADVAIR) 250-50 MCG/DOSE AEPB Inhale 1 puff into the lungs daily.     . folic acid (FOLVITE) 400 MCG tablet Take 400 mcg by mouth daily.    Marland Kitchen lamoTRIgine (LAMICTAL) 200 MG tablet Take 200 mg by mouth 3 (three) times daily.     Marland Kitchen levETIRAcetam (KEPPRA) 500 MG tablet Take 500 mg by mouth at bedtime.    Marland Kitchen losartan (COZAAR) 25 MG tablet Take 25 mg by mouth at bedtime.    . Multiple Vitamins-Minerals (BARIATRIC MULTIVITAMINS/IRON PO) Take 1 tablet by mouth daily.    . pantoprazole (PROTONIX) 40 MG tablet Take 1 tablet (40 mg total) by mouth daily. 90 tablet 0  . Potassium & Sodium Phosphates (PHOSPHORUS SUPPLEMENT PO) Take 30 mg by mouth.    Marland Kitchen  PROAIR HFA 108 (90 Base) MCG/ACT inhaler Inhale 1-2 puffs into the lungs every 6 (six) hours as needed for wheezing or shortness of breath.     . Pyridoxine HCl (B-6 PO) Take 1.7 mg by mouth.    . Vitamin A 2400 MCG (8000 UT) CAPS Take by mouth.    Marland Kitchen. VITAMIN E PO Take 7.5 mg by mouth.    . Zinc Sulfate (ZINC 15 PO) Take by mouth.    . enoxaparin (LOVENOX) 40 MG/0.4ML injection Inject 0.4 mLs (40 mg total) into the skin every 12 (twelve) hours for 28 doses. 11.2 mL 0   No current facility-administered medications for this visit.     PHYSICAL EXAMINATION:   Vitals:   10/11/20 1107  BP: 113/79  Pulse: 81  Resp: 16  Temp: 97.6 F (36.4 C)  SpO2: 97%   Filed Weights   10/11/20 1107  Weight: 191 lb (86.6 kg)    Physical Exam HENT:     Head: Normocephalic and atraumatic.     Mouth/Throat:     Pharynx: No oropharyngeal exudate.  Eyes:     Pupils: Pupils are equal, round, and reactive to light.  Cardiovascular:     Rate and Rhythm: Normal rate and regular rhythm.  Pulmonary:     Effort: No respiratory distress.     Breath sounds: No wheezing.  Abdominal:     General: Bowel sounds are normal. There is no distension.     Palpations: Abdomen is soft. There is no mass.      Tenderness: There is no abdominal tenderness. There is no guarding or rebound.  Musculoskeletal:        General: No tenderness. Normal range of motion.     Cervical back: Normal range of motion and neck supple.  Skin:    General: Skin is warm.  Neurological:     Mental Status: She is alert and oriented to person, place, and time.  Psychiatric:        Mood and Affect: Affect normal.      LABORATORY DATA:  I have reviewed the data as listed Lab Results  Component Value Date   WBC 13.2 (H) 02/01/2020   HGB 14.1 02/01/2020   HCT 43.5 02/01/2020   MCV 87.9 02/01/2020   PLT 261 02/01/2020   Recent Labs    01/29/20 0845 01/30/20 1636 02/01/20 0425  NA 135 136 136  K 4.0 3.8 3.9  CL 101 102 103  CO2 25 26 24   GLUCOSE 187* 167* 141*  BUN 6 7 6   CREATININE 0.53 0.56 0.50  CALCIUM 8.8* 8.6* 8.3*  GFRNONAA >60 >60 >60  GFRAA >60 >60 >60  PROT 7.0  --   --   ALBUMIN 3.7  --   --   AST 19  --   --   ALT 35  --   --   ALKPHOS 94  --   --   BILITOT 0.4  --   --      No results found.  ASSESSMENT & PLAN:   Erythrocytosis #Mild leukocytosis [October 2021-12.5; mild neutrophilia/lymphocytosis]/erythrocytosis-hemoglobin 16.2 hematocrit 47.  Based on labs appears reactive [smoking/OSA] rather than new primary bone marrow process.  However we will recheck myeloproliferative neoplasm panel; BCR ABL panel.  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  Check B12 levels.  However this should not attribute to patient's above mild blood count abnormalities.  #Smoking: Counseled the patient to quit smoking.   Thank you, Ms.Karma GreaserBoswell, NP for allowing  me to participate in the care of your pleasant patient. Please do not hesitate to contact me with questions or concerns in the interim.  # DISPOSITION:  # follow up TBD- Dr.B     Earna Coder, MD 10/14/2020 4:59 PM

## 2020-10-11 NOTE — Assessment & Plan Note (Addendum)
#  Mild leukocytosis [October 2021-12.5; mild neutrophilia/lymphocytosis]/erythrocytosis-hemoglobin 16.2 hematocrit 47.  Based on labs appears reactive [smoking/OSA] rather than new primary bone marrow process.  However we will recheck myeloproliferative neoplasm panel; BCR ABL panel.  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  Check B12 levels.  However this should not attribute to patient's above mild blood count abnormalities.  #Smoking: Counseled the patient to quit smoking.   Thank you, Ms.Karma Greaser, NP for allowing me to participate in the care of your pleasant patient. Please do not hesitate to contact me with questions or concerns in the interim.  # DISPOSITION:  # follow up TBD- Dr.B

## 2020-10-14 ENCOUNTER — Other Ambulatory Visit: Payer: Self-pay | Admitting: Internal Medicine

## 2020-10-14 DIAGNOSIS — Z9884 Bariatric surgery status: Secondary | ICD-10-CM

## 2020-10-14 NOTE — Progress Notes (Signed)
x

## 2020-10-15 ENCOUNTER — Telehealth: Payer: Self-pay | Admitting: Internal Medicine

## 2020-10-15 NOTE — Telephone Encounter (Signed)
On 11/1-called patient and reviewed the results of the previous blood work.  Not clinically concerning for any serious malignancy.  However will rule out MPN/CML-ordered.  C-schedule labs.  C-Follow-up in approximately 1 week-10 days- MD/no labs. GB

## 2020-10-16 ENCOUNTER — Inpatient Hospital Stay: Payer: Medicare Other

## 2020-10-25 ENCOUNTER — Inpatient Hospital Stay: Payer: Medicare Other | Attending: Internal Medicine | Admitting: Internal Medicine

## 2020-11-13 ENCOUNTER — Ambulatory Visit
Admission: RE | Admit: 2020-11-13 | Discharge: 2020-11-13 | Disposition: A | Discharge status: Home / Self Care | Payer: Medicare Other | Source: Ambulatory Visit | Attending: Nurse Practitioner | Admitting: Nurse Practitioner

## 2020-11-13 ENCOUNTER — Other Ambulatory Visit: Payer: Self-pay

## 2020-11-13 DIAGNOSIS — Z1231 Encounter for screening mammogram for malignant neoplasm of breast: Secondary | ICD-10-CM | POA: Insufficient documentation

## 2021-04-11 ENCOUNTER — Encounter: Payer: Self-pay | Admitting: Dietician

## 2021-04-11 NOTE — Progress Notes (Signed)
Encounter opened in error

## 2021-05-07 DIAGNOSIS — G4733 Obstructive sleep apnea (adult) (pediatric): Secondary | ICD-10-CM | POA: Diagnosis not present

## 2021-05-14 DIAGNOSIS — Z9884 Bariatric surgery status: Secondary | ICD-10-CM | POA: Diagnosis not present

## 2021-05-14 DIAGNOSIS — Z09 Encounter for follow-up examination after completed treatment for conditions other than malignant neoplasm: Secondary | ICD-10-CM | POA: Diagnosis not present

## 2021-05-14 DIAGNOSIS — Z8719 Personal history of other diseases of the digestive system: Secondary | ICD-10-CM | POA: Diagnosis not present

## 2021-06-12 DIAGNOSIS — G4733 Obstructive sleep apnea (adult) (pediatric): Secondary | ICD-10-CM | POA: Diagnosis not present

## 2021-07-04 DIAGNOSIS — K432 Incisional hernia without obstruction or gangrene: Secondary | ICD-10-CM | POA: Diagnosis not present

## 2021-07-31 DIAGNOSIS — E569 Vitamin deficiency, unspecified: Secondary | ICD-10-CM | POA: Diagnosis not present

## 2021-07-31 DIAGNOSIS — E785 Hyperlipidemia, unspecified: Secondary | ICD-10-CM | POA: Diagnosis not present

## 2021-07-31 DIAGNOSIS — E1165 Type 2 diabetes mellitus with hyperglycemia: Secondary | ICD-10-CM | POA: Diagnosis not present

## 2021-07-31 DIAGNOSIS — I10 Essential (primary) hypertension: Secondary | ICD-10-CM | POA: Diagnosis not present

## 2021-08-04 DIAGNOSIS — R7303 Prediabetes: Secondary | ICD-10-CM | POA: Diagnosis not present

## 2021-08-04 DIAGNOSIS — G43009 Migraine without aura, not intractable, without status migrainosus: Secondary | ICD-10-CM | POA: Diagnosis not present

## 2021-08-04 DIAGNOSIS — F17209 Nicotine dependence, unspecified, with unspecified nicotine-induced disorders: Secondary | ICD-10-CM | POA: Diagnosis not present

## 2021-08-04 DIAGNOSIS — E785 Hyperlipidemia, unspecified: Secondary | ICD-10-CM | POA: Diagnosis not present

## 2021-08-06 ENCOUNTER — Encounter: Payer: Self-pay | Admitting: Nurse Practitioner

## 2021-08-07 ENCOUNTER — Telehealth: Payer: Self-pay | Admitting: *Deleted

## 2021-08-07 NOTE — Telephone Encounter (Signed)
[facility] faxed over labs for Dr. Donneta Romberg to review. I contacted [facility] to see if they wanted Dr. B to see the patient. The nurse stated that the NP had asked the patient to call our office for an apt for f/u with abn wbc counts. I contacted the patient to see what she would like to do. She states that she only wants Dr. B to review the labs. If an apt is needed, to call her back with an apt.

## 2021-08-11 ENCOUNTER — Telehealth: Payer: Self-pay | Admitting: Internal Medicine

## 2021-08-11 NOTE — Telephone Encounter (Signed)
On 8/29-chronic mild elevation of white count likely reactive.  Reviewed the blood work at PCPs office-no further work-up recommended at this time.  Follow-up as needed  GB

## 2021-08-11 NOTE — Telephone Encounter (Signed)
Per Dr.Brahmanday - Md reviewed labs. Labs are stable. Follow-up only as needed.  Pt contacted. She was informed of the plan and agreeable. She thanked me for calling her back.

## 2021-08-25 DIAGNOSIS — H029 Unspecified disorder of eyelid: Secondary | ICD-10-CM | POA: Diagnosis not present

## 2021-10-02 ENCOUNTER — Encounter: Payer: Self-pay | Admitting: Plastic Surgery

## 2021-10-02 ENCOUNTER — Other Ambulatory Visit: Payer: Self-pay

## 2021-10-02 ENCOUNTER — Ambulatory Visit (INDEPENDENT_AMBULATORY_CARE_PROVIDER_SITE_OTHER): Payer: Medicare Other | Admitting: Plastic Surgery

## 2021-10-02 VITALS — BP 118/76 | HR 79 | Ht 65.0 in | Wt 189.4 lb

## 2021-10-02 DIAGNOSIS — M793 Panniculitis, unspecified: Secondary | ICD-10-CM | POA: Diagnosis not present

## 2021-10-02 NOTE — Progress Notes (Signed)
Referring Provider Fayrene Helper, NP [address] [place],  [place] [address]   CC:  Chief Complaint  Patient presents with   Advice Only      [place] VAANYA [name] is an 40 y.o. female.  HPI: Patient presents to discuss panniculectomy.  She has had bariatric surgery in the past.  She has a recurrent lower midline ventral hernia and she has seen Dr. Dossie Der who is planning a repair of that.  She wanted to see if her symptomatic pannus could be treated at the same time.  She has lost in total over 130 pounds and kept it off.  She is bothered by the overhanging skin.  She gets rashes beneath the crease that have been refractory to over-the-counter treatments.  She is not a diabetic.  She has a history of smoking but has not smoked for the past month and plans to stay away from nicotine products.  Allergies  Allergen Reactions   Hand Sanitizer [Ethyl Alcohol (Skin Cleanser)] Hives   Chlorhexidine Other (See Comments)   Codeine     HALLUCINATIONS   Morphine And Related Itching   Phenergan [Promethazine Hcl] Nausea And Vomiting    Outpatient Encounter Medications as of 10/02/2021  Medication Sig   Ascorbic Acid (VITAMIN C) 100 MG tablet Take 100 mg by mouth daily.   atorvastatin (LIPITOR) 40 MG tablet Take 40 mg by mouth daily at 6 PM.   Calcium Carbonate (CALCIUM 500 PO) Take by mouth. 2 times daily   citalopram (CELEXA) 40 MG tablet Take 1 tablet (40 mg total) by mouth daily. (Patient taking differently: Take 40 mg by mouth at bedtime.)   Ferrous Sulfate (IRON PO) Take by mouth.   Fluticasone-Salmeterol (ADVAIR) 250-50 MCG/DOSE AEPB Inhale 1 puff into the lungs daily.    folic acid (FOLVITE) 400 MCG tablet Take 400 mcg by mouth daily.   lamoTRIgine (LAMICTAL) 200 MG tablet Take 200 mg by mouth 3 (three) times daily.    levETIRAcetam (KEPPRA) 500 MG tablet Take 500 mg by mouth at bedtime.   losartan (COZAAR) 25 MG tablet Take 25 mg by mouth at bedtime.   Multiple Vitamins-Minerals  (BARIATRIC MULTIVITAMINS/IRON PO) Take 1 tablet by mouth daily.   pantoprazole (PROTONIX) 40 MG tablet Take 1 tablet (40 mg total) by mouth daily.   Potassium & Sodium Phosphates (PHOSPHORUS SUPPLEMENT PO) Take 30 mg by mouth.   PROAIR HFA 108 (90 Base) MCG/ACT inhaler Inhale 1-2 puffs into the lungs every 6 (six) hours as needed for wheezing or shortness of breath.    Pyridoxine HCl (B-6 PO) Take 1.7 mg by mouth.   Vitamin A 2400 MCG (8000 UT) CAPS Take by mouth.   VITAMIN E PO Take 7.5 mg by mouth.   Zinc Sulfate (ZINC 15 PO) Take by mouth.   [DISCONTINUED] canagliflozin (INVOKANA) 300 MG TABS tablet Take 300 mg by mouth daily before breakfast.   [DISCONTINUED] enoxaparin (LOVENOX) 40 MG/0.4ML injection Inject 0.4 mLs (40 mg total) into the skin every 12 (twelve) hours for 28 doses.   No facility-administered encounter medications on file as of 10/02/2021.     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   CARPAL TUNNEL RELEASE Right 01/27/2017   Procedure: CARPAL TUNNEL RELEASE;  Surgeon: Erin Sons, MD;  Location: [facility] ORS;  Service: Orthopedics;  Laterality: Right;   COLON SURGERY  2014   bowel resection/colostomy   COLOSTOMY REVISION N/A 11/07/2013   Procedure: COLON RESECTION SIGMOID;  Surgeon: [name], MD;  Location: [facility] OR;  Service: General;  Laterality: N/A;   ctr Left    GASTRIC ROUX-EN-Y N/A 01/29/2020   Procedure: LAPAROSCOPIC ROUX-EN-Y GASTRIC BYPASS WITH UPPER ENDOSCOPY, REDUCTION of Incarcerated Hernia, Laproscopic Open Repair of Large Lower 10 cm hernia;  Surgeon: Luretha Murphy, MD;  Location: [facility] ORS;  Service: General;  Laterality: N/A;    Family History  Problem Relation Age of Onset   Cancer Maternal Grandfather        prostate   Cancer Maternal Aunt        breast   Breast cancer Maternal Aunt    Breast cancer Paternal Aunt     Social History   Social History Narrative   Not on file     Review of Systems General: Denies fevers, chills, weight loss CV: Denies chest pain, shortness of breath, palpitations  Physical Exam Vitals with BMI 10/02/2021 10/11/2020 09/04/2020  Height 5\' 5"  5\' 4"  5\' 4"   Weight 189 lbs 6 oz 191 lbs 199 lbs 2 oz  BMI 31.52 32.77 34.16  Systolic 118 113 -  Diastolic 76 79 -  Pulse 79 81 -  Some encounter information is confidential and restricted. Go to Review Flowsheets activity to see all data.    General:  No acute distress,  Alert and oriented, Non-Toxic, Normal speech and affect Abdomen: Abdomen soft nontender.  She has a lower midline hernia that feels like it is inferior to the umbilical stalk but not by much.  This feels reducible to me.  Otherwise she has moderate to severe excess skin and mild excess adipose tissue.  There is signs of chronic irritation in the crease beneath the pannus that have been documented through photographs today.  Assessment/Plan Patient is a good candidate for panniculectomy at the time of her hernia repair.  I did encourage her to stay off the nicotine products both from the standpoint of her hernia recurrence and wound healing from the panniculectomy.  I did discuss with her that it may be difficult to preserve her umbilicus.  Depending on the extent of the hernia and the method of repair it may ultimately give her a better result if we sacrifice it.  She is supportive of that.  I discussed the location and orientation of the scars.  I discussed the risks include bleeding, infection, damage to surrounding structures and need for additional procedures.  I also discussed making the  inferior scar low to try to address some of the fullness and redundancy in the suprapubic area.  I explained the general postoperative expectations including the need for drains.  All of her questions were answered.  10/02/2021, 4:02 PM

## 2021-10-06 DIAGNOSIS — G4733 Obstructive sleep apnea (adult) (pediatric): Secondary | ICD-10-CM | POA: Diagnosis not present

## 2021-10-16 ENCOUNTER — Telehealth: Payer: Self-pay | Admitting: Plastic Surgery

## 2021-10-16 DIAGNOSIS — G4733 Obstructive sleep apnea (adult) (pediatric): Secondary | ICD-10-CM | POA: Diagnosis not present

## 2021-10-16 NOTE — Telephone Encounter (Signed)
Patient called stating that she was informed by [facility] that there might be an issue and she should follow up with our office regarding her surgery date on 01/09/2021. Attempted to call patient back but her husband answered and said she was at the doctors. Asked that he have her call back. He stated okay.Trying to let patient know that the date is tentative because her surgery authorization is still pending and the panniculectomy has not been approved  yet. Until we get an approval, we cannot officially move forward with surgery.This is tentative since the other office wanted to get something on the books.  Once we have an approval from them we will call to schedule surgical appointments. If they deny it, the surgery may have to be cancelled.

## 2021-10-27 ENCOUNTER — Telehealth: Payer: Self-pay | Admitting: Plastic Surgery

## 2021-10-27 NOTE — Telephone Encounter (Signed)
Called patient to advise that her insurance denied the authorization due to the fact her excess skin does not fall below the pubis line and she has not had any treatment for skin rashes, sores, bleeding underneath the excess skin. Advised that her medical records did not show that there is severe pain or that she is unable to use the bathroom, bathe or dressing. Advised that the insurance stated only an appeal could be done and we would have to submit additional information for reconsideration. Patient will see if her PCP can prescribe her something for the skin irritation and let us know if she wants to resubmit to insurance.

## 2021-11-14 ENCOUNTER — Telehealth: Payer: Self-pay | Admitting: Plastic Surgery

## 2021-11-14 DIAGNOSIS — H029 Unspecified disorder of eyelid: Secondary | ICD-10-CM | POA: Diagnosis not present

## 2021-11-14 NOTE — Telephone Encounter (Signed)
Patient was supposed to be obtaining a letter from Dr. Daphine Deutscher regarding rash to submit for an appeal. Left message to follow up on whether that letter was obtained and if it could be faxed to our office.

## 2021-11-15 DIAGNOSIS — G4733 Obstructive sleep apnea (adult) (pediatric): Secondary | ICD-10-CM | POA: Diagnosis not present

## 2021-11-20 DIAGNOSIS — G4733 Obstructive sleep apnea (adult) (pediatric): Secondary | ICD-10-CM | POA: Diagnosis not present

## 2021-11-26 ENCOUNTER — Institutional Professional Consult (permissible substitution): Payer: Medicare Other | Admitting: Plastic Surgery

## 2021-12-16 ENCOUNTER — Ambulatory Visit: Payer: Self-pay | Admitting: Surgery

## 2021-12-16 DIAGNOSIS — G4733 Obstructive sleep apnea (adult) (pediatric): Secondary | ICD-10-CM | POA: Diagnosis not present

## 2021-12-22 ENCOUNTER — Telehealth: Payer: Self-pay | Admitting: Plastic Surgery

## 2021-12-22 NOTE — Telephone Encounter (Signed)
Tried to call patient to advise that insurance has new policy and has to be resubmitted/started over. Left message with patient's spouse for patient to call back. Also called [facility] to advise scheduler that surgery will need to be postponed.

## 2021-12-25 ENCOUNTER — Encounter: Payer: Medicare Other | Admitting: Physician Assistant

## 2021-12-31 DIAGNOSIS — G4733 Obstructive sleep apnea (adult) (pediatric): Secondary | ICD-10-CM | POA: Diagnosis not present

## 2022-01-09 ENCOUNTER — Encounter (HOSPITAL_BASED_OUTPATIENT_CLINIC_OR_DEPARTMENT_OTHER): Payer: Self-pay

## 2022-01-09 ENCOUNTER — Ambulatory Visit (HOSPITAL_BASED_OUTPATIENT_CLINIC_OR_DEPARTMENT_OTHER): Admit: 2022-01-09 | Payer: Medicare Other | Admitting: Surgery

## 2022-01-09 SURGERY — REPAIR, HERNIA, INCISIONAL
Anesthesia: General

## 2022-01-11 DIAGNOSIS — E785 Hyperlipidemia, unspecified: Secondary | ICD-10-CM | POA: Diagnosis not present

## 2022-01-11 DIAGNOSIS — G43009 Migraine without aura, not intractable, without status migrainosus: Secondary | ICD-10-CM | POA: Diagnosis not present

## 2022-01-11 DIAGNOSIS — E1165 Type 2 diabetes mellitus with hyperglycemia: Secondary | ICD-10-CM | POA: Diagnosis not present

## 2022-01-16 DIAGNOSIS — G4733 Obstructive sleep apnea (adult) (pediatric): Secondary | ICD-10-CM | POA: Diagnosis not present

## 2022-01-20 ENCOUNTER — Telehealth: Payer: Self-pay | Admitting: Plastic Surgery

## 2022-01-20 NOTE — Telephone Encounter (Signed)
Requested call back regarding surgery authorization.

## 2022-01-23 ENCOUNTER — Telehealth: Payer: Self-pay | Admitting: Plastic Surgery

## 2022-01-23 NOTE — Telephone Encounter (Signed)
Called patient to advise that the date both providers were available was after the authorization end date and per [facility] Medicare, an extension on the dates meant a new authorization would have to be initiated. Advised patient there was one date in March that was possible but I would have to reach out to the office to see what it was and if it was still available. Patient said anything after 02/23/2022 would work. Patient gave me permission to initiate new authorization if date was not in her desired time frame. Left message with coordinated office.

## 2022-02-04 DIAGNOSIS — E785 Hyperlipidemia, unspecified: Secondary | ICD-10-CM | POA: Diagnosis not present

## 2022-02-04 DIAGNOSIS — R7303 Prediabetes: Secondary | ICD-10-CM | POA: Diagnosis not present

## 2022-02-06 DIAGNOSIS — F17209 Nicotine dependence, unspecified, with unspecified nicotine-induced disorders: Secondary | ICD-10-CM | POA: Diagnosis not present

## 2022-02-06 DIAGNOSIS — E785 Hyperlipidemia, unspecified: Secondary | ICD-10-CM | POA: Diagnosis not present

## 2022-02-13 DIAGNOSIS — G4733 Obstructive sleep apnea (adult) (pediatric): Secondary | ICD-10-CM | POA: Diagnosis not present

## 2022-03-03 DIAGNOSIS — G4733 Obstructive sleep apnea (adult) (pediatric): Secondary | ICD-10-CM | POA: Diagnosis not present

## 2022-03-09 NOTE — Progress Notes (Signed)
? ?  Patient ID: Rachel [name], female    DOB: 1981-02-01, 41 y.o.   MRN: [id number] ? ?Chief Complaint  ?Patient presents with  ? Pre-op Exam  ? ? ?  ICD-10-CM   ?1. Panniculitis  M79.3   ?  ? ? ? ?History of Present Illness: ?Rachel [name] is a 41 y.o.  female  with a history of panniculitis.  She presents for preoperative evaluation for upcoming procedure, panniculectomy at time of ventral hernia repair, scheduled for 04/03/2022 with Dr. Arita Miss. ? ?The patient has not had any issues with anesthesia, but states that she is unsure as to whether or not she could have surgery performed at the day center.  She states that she requires a central line or PICC line for anesthesia and that any common IVs cause her to have thrombophlebitis.  She had a panic attack and almost had to cancel her last hernia repair surgery after she was told that there would be attempting IV placement.  She attributes this problem to thick skin.  She understands that she may no longer have a bellybutton after surgery.  She denies any personal or family history of blood clots or clotting disorder.  She does not take any birth control or hormone replacement.  She quit smoking tobacco 4 months ago and has not used any nicotine-containing products.  She does have a history of OSA for which she uses CPAP.  She also has a history of asthma, but reports that is well controlled.  She understands that she will need to avoid NSAIDs due to history of Roux-en-Y gastric bypass.  She is requesting that she be admitted for observation after surgery given that she required admissions with previous hernia repairs. ? ?Summary of Previous Visit: Patient was seen for consult on 10/02/2021 by Dr. Arita Miss.  At that time, discussed recurrent lower midline ventral hernia that she plans to have repaired by Dr. Dossie Der.  She also expressed interest in surgical excision of her large pannus.  She has lost over 130 pounds and is bothered by excess hanging skin.  She  experiences recurrent rashes refractory to medical management.  She does have a history of tobacco use disorder, but plans to abstained around time of surgery.  She was informed that it may be difficult to preserve her umbilicus, she is okay with that. ? ?Job: Not currently employed. ? ?PMH Significant for: Recurrent panniculitis, previous ventral hernia repair, tobacco use disorder, Roux-en-Y gastric bypass. ? ? ?Past Medical History: ?Allergies: ?Allergies  ?Allergen Reactions  ? Hand Sanitizer [Ethyl Alcohol (Skin Cleanser)] Hives  ? Chlorhexidine Other (See Comments)  ? Codeine   ?  HALLUCINATIONS  ? Morphine And Related Itching  ? Phenergan [Promethazine Hcl] Nausea And Vomiting  ? ? ?Current Medications: ? ?Current Outpatient Medications:  ?  Ascorbic Acid (VITAMIN C) 100 MG tablet, Take 100 mg by mouth daily., Disp: , Rfl:  ?  atorvastatin (LIPITOR) 40 MG tablet, Take 40 mg by mouth daily at 6 PM., Disp: , Rfl:  ?  Calcium Carbonate (CALCIUM 500 PO), Take by mouth. 2 times daily, Disp: , Rfl:  ?  citalopram (CELEXA) 40 MG tablet, Take 1 tablet (40 mg total) by mouth daily. (Patient taking differently: Take 40 mg by mouth at bedtime.), Disp: 30 tablet, Rfl: 2 ?  Ferrous Sulfate (IRON PO), Take by mouth., Disp: , Rfl:  ?  Fluticasone-Salmeterol (ADVAIR) 250-50 MCG/DOSE AEPB, Inhale 1 puff into the lungs daily. , Disp: , Rfl:  ?  folic acid (FOLVITE) 400 MCG tablet, Take 400 mcg by mouth daily., Disp: , Rfl:  ?  lamoTRIgine (LAMICTAL) 200 MG tablet, Take 200 mg by mouth 3 (three) times daily. , Disp: , Rfl:  ?  levETIRAcetam (KEPPRA) 500 MG tablet, Take 500 mg by mouth at bedtime., Disp: , Rfl:  ?  losartan (COZAAR) 25 MG tablet, Take 25 mg by mouth at bedtime., Disp: , Rfl:  ?  Multiple Vitamins-Minerals (BARIATRIC MULTIVITAMINS/IRON PO), Take 1 tablet by mouth daily., Disp: , Rfl:  ?  ondansetron (ZOFRAN-ODT) 4 MG disintegrating tablet, Take 1 tablet (4 mg total) by mouth every 8 (eight) hours as needed for  nausea or vomiting., Disp: 20 tablet, Rfl: 0 ?  oxyCODONE (ROXICODONE) 5 MG immediate release tablet, Take 1 tablet (5 mg total) by mouth every 6 (six) hours as needed for up to 5 days for severe pain., Disp: 20 tablet, Rfl: 0 ?  pantoprazole (PROTONIX) 40 MG tablet, Take 1 tablet (40 mg total) by mouth daily., Disp: 90 tablet, Rfl: 0 ?  Potassium & Sodium Phosphates (PHOSPHORUS SUPPLEMENT PO), Take 30 mg by mouth., Disp: , Rfl:  ?  PROAIR HFA 108 (90 Base) MCG/ACT inhaler, Inhale 1-2 puffs into the lungs every 6 (six) hours as needed for wheezing or shortness of breath. , Disp: , Rfl:  ?  Pyridoxine HCl (B-6 PO), Take 1.7 mg by mouth., Disp: , Rfl:  ?  Vitamin A 2400 MCG (8000 UT) CAPS, Take by mouth., Disp: , Rfl:  ?  VITAMIN E PO, Take 7.5 mg by mouth., Disp: , Rfl:  ?  Zinc Sulfate (ZINC 15 PO), Take by mouth., Disp: , Rfl:  ? ?Past Medical Problems: ?Past Medical History:  ?Diagnosis Date  ? Asthma   ? Bipolar disorder (HCC)   ? Depression   ? Diabetes mellitus without complication (HCC)   ? Diverticulosis   ? Epilepsy (HCC)   ? 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 last seizure Sunday 01/24/17.anxiety precipitates seizure  ? Migraines   ? "haven't had one since I was a kid" (06/05/2013)  ? Sleep apnea   ? CPAP  ? ? ?Past Surgical History: ?Past Surgical History:  ?Procedure Laterality Date  ? CARPAL TUNNEL RELEASE Left 2011?  ? CARPAL TUNNEL RELEASE Right 01/27/2017  ? Procedure: CARPAL TUNNEL RELEASE;  Surgeon: Harold Kernodle, MD;  Location: [facility] ORS;  Service: Orthopedics;  Laterality: Right;  ? COLON SURGERY  2014  ? bowel resection/colostomy  ? COLOSTOMY REVISION N/A 11/07/2013  ? Procedure: COLON RESECTION SIGMOID;  Surgeon: James O Wyatt, MD;  Location: [facility];  Service: General;  Laterality: N/A;  ? ctr Left   ? GASTRIC ROUX-EN-Y N/A 01/29/2020  ? Procedure: LAPAROSCOPIC ROUX-EN-Y GASTRIC BYPASS WITH UPPER ENDOSCOPY, REDUCTION of Incarcerated Hernia, Laproscopic Open Repair of Large Lower 10 cm hernia;  Surgeon:  Martin, Matthew, MD;  Location: [facility];  Service: General;  Laterality: N/A;  ? ? ?Social History: ?Social History  ? ?Socioeconomic History  ? Marital status: Married  ?  Spouse name: Not on file  ? Number of children: Not on file  ? Years of education: Not on file  ? Highest education level: Not on file  ?Occupational History  ? Not on file  ?Tobacco Use  ? Smoking status: Every Day  ?  Packs/day: 1.00  ?  Years: 18.00  ?  Pack years: 18.00  ?  Types: Cigarettes  ? Smokeless tobacco: Former  ?  Quit date: 08/2021  ?  Vaping Use  ? Vaping Use: Never used  ?Substance and Sexual Activity  ? Alcohol use: No  ? Drug use: No  ? Sexual activity: Yes  ?  Birth control/protection: I.U.D.  ?Other Topics Concern  ? Not on file  ?Social History Narrative  ? Not on file  ? ?Social Determinants of Health  ? ?Financial Resource Strain: Not on file  ?Food Insecurity: Not on file  ?Transportation Needs: Not on file  ?Physical Activity: Not on file  ?Stress: Not on file  ?Social Connections: Not on file  ?Intimate Partner Violence: Not on file  ? ? ?Family History: ?Family History  ?Problem Relation Age of Onset  ? Cancer Maternal Grandfather   ?     prostate  ? Cancer Maternal Aunt   ?     breast  ? Breast cancer Maternal Aunt   ? Breast cancer Paternal Aunt   ? ? ?Review of Systems: ?ROS ?Denies recent chest pain or difficulty breathing, fevers or infection, leg swelling. ? ?Physical Exam: ?Vital Signs ?BP 125/79 (BP Location: Left Arm, Patient Position: Sitting, Cuff Size: Small)   Pulse 79   Ht 5\' 5"  (1.651 m)   Wt 200 lb 12.8 oz (91.1 kg)   SpO2 97%   BMI 33.41 kg/m?  ? ?Physical Exam ?Constitutional:   ?   General: Not in acute distress. ?   Appearance: Normal appearance. Not ill-appearing.  ?HENT:  ?   Head: Normocephalic and atraumatic.  ?Eyes:  ?   Pupils: Pupils are equal, round. ?Cardiovascular:  ?   Rate and Rhythm: Normal rate. ?   Pulses: Normal pulses.  ?Pulmonary:  ?   Effort: No respiratory distress or increased  work of breathing.  Speaks in full sentences. ?Abdominal:  ?   General: Abdomen is flat. No distension.   ?Musculoskeletal: Normal range of motion. No lower extremity swelling or edema. No varicosities. ?Skin: ?   Gener

## 2022-03-09 NOTE — H&P (View-Only) (Signed)
? ?  Patient ID: Rachel [name], female    DOB: 1981-02-01, 41 y.o.   MRN: [id number] ? ?Chief Complaint  ?Patient presents with  ? Pre-op Exam  ? ? ?  ICD-10-CM   ?1. Panniculitis  M79.3   ?  ? ? ? ?History of Present Illness: ?Rachel [name] is a 41 y.o.  female  with a history of panniculitis.  She presents for preoperative evaluation for upcoming procedure, panniculectomy at time of ventral hernia repair, scheduled for 04/03/2022 with Dr. Arita Miss. ? ?The patient has not had any issues with anesthesia, but states that she is unsure as to whether or not she could have surgery performed at the day center.  She states that she requires a central line or PICC line for anesthesia and that any common IVs cause her to have thrombophlebitis.  She had a panic attack and almost had to cancel her last hernia repair surgery after she was told that there would be attempting IV placement.  She attributes this problem to thick skin.  She understands that she may no longer have a bellybutton after surgery.  She denies any personal or family history of blood clots or clotting disorder.  She does not take any birth control or hormone replacement.  She quit smoking tobacco 4 months ago and has not used any nicotine-containing products.  She does have a history of OSA for which she uses CPAP.  She also has a history of asthma, but reports that is well controlled.  She understands that she will need to avoid NSAIDs due to history of Roux-en-Y gastric bypass.  She is requesting that she be admitted for observation after surgery given that she required admissions with previous hernia repairs. ? ?Summary of Previous Visit: Patient was seen for consult on 10/02/2021 by Dr. Arita Miss.  At that time, discussed recurrent lower midline ventral hernia that she plans to have repaired by Dr. Dossie Der.  She also expressed interest in surgical excision of her large pannus.  She has lost over 130 pounds and is bothered by excess hanging skin.  She  experiences recurrent rashes refractory to medical management.  She does have a history of tobacco use disorder, but plans to abstained around time of surgery.  She was informed that it may be difficult to preserve her umbilicus, she is okay with that. ? ?Job: Not currently employed. ? ?PMH Significant for: Recurrent panniculitis, previous ventral hernia repair, tobacco use disorder, Roux-en-Y gastric bypass. ? ? ?Past Medical History: ?Allergies: ?Allergies  ?Allergen Reactions  ? Hand Sanitizer [Ethyl Alcohol (Skin Cleanser)] Hives  ? Chlorhexidine Other (See Comments)  ? Codeine   ?  HALLUCINATIONS  ? Morphine And Related Itching  ? Phenergan [Promethazine Hcl] Nausea And Vomiting  ? ? ?Current Medications: ? ?Current Outpatient Medications:  ?  Ascorbic Acid (VITAMIN C) 100 MG tablet, Take 100 mg by mouth daily., Disp: , Rfl:  ?  atorvastatin (LIPITOR) 40 MG tablet, Take 40 mg by mouth daily at 6 PM., Disp: , Rfl:  ?  Calcium Carbonate (CALCIUM 500 PO), Take by mouth. 2 times daily, Disp: , Rfl:  ?  citalopram (CELEXA) 40 MG tablet, Take 1 tablet (40 mg total) by mouth daily. (Patient taking differently: Take 40 mg by mouth at bedtime.), Disp: 30 tablet, Rfl: 2 ?  Ferrous Sulfate (IRON PO), Take by mouth., Disp: , Rfl:  ?  Fluticasone-Salmeterol (ADVAIR) 250-50 MCG/DOSE AEPB, Inhale 1 puff into the lungs daily. , Disp: , Rfl:  ?  folic acid (FOLVITE) 400 MCG tablet, Take 400 mcg by mouth daily., Disp: , Rfl:  ?  lamoTRIgine (LAMICTAL) 200 MG tablet, Take 200 mg by mouth 3 (three) times daily. , Disp: , Rfl:  ?  levETIRAcetam (KEPPRA) 500 MG tablet, Take 500 mg by mouth at bedtime., Disp: , Rfl:  ?  losartan (COZAAR) 25 MG tablet, Take 25 mg by mouth at bedtime., Disp: , Rfl:  ?  Multiple Vitamins-Minerals (BARIATRIC MULTIVITAMINS/IRON PO), Take 1 tablet by mouth daily., Disp: , Rfl:  ?  ondansetron (ZOFRAN-ODT) 4 MG disintegrating tablet, Take 1 tablet (4 mg total) by mouth every 8 (eight) hours as needed for  nausea or vomiting., Disp: 20 tablet, Rfl: 0 ?  oxyCODONE (ROXICODONE) 5 MG immediate release tablet, Take 1 tablet (5 mg total) by mouth every 6 (six) hours as needed for up to 5 days for severe pain., Disp: 20 tablet, Rfl: 0 ?  pantoprazole (PROTONIX) 40 MG tablet, Take 1 tablet (40 mg total) by mouth daily., Disp: 90 tablet, Rfl: 0 ?  Potassium & Sodium Phosphates (PHOSPHORUS SUPPLEMENT PO), Take 30 mg by mouth., Disp: , Rfl:  ?  PROAIR HFA 108 (90 Base) MCG/ACT inhaler, Inhale 1-2 puffs into the lungs every 6 (six) hours as needed for wheezing or shortness of breath. , Disp: , Rfl:  ?  Pyridoxine HCl (B-6 PO), Take 1.7 mg by mouth., Disp: , Rfl:  ?  Vitamin A 2400 MCG (8000 UT) CAPS, Take by mouth., Disp: , Rfl:  ?  VITAMIN E PO, Take 7.5 mg by mouth., Disp: , Rfl:  ?  Zinc Sulfate (ZINC 15 PO), Take by mouth., Disp: , Rfl:  ? ?Past Medical Problems: ?Past Medical History:  ?Diagnosis Date  ? Asthma   ? Bipolar disorder (HCC)   ? Depression   ? Diabetes mellitus without complication (HCC)   ? Diverticulosis   ? Epilepsy (HCC)   ? Grand mal seizure [facility](HCC)   ? last seizure Sunday 01/24/17.anxiety precipitates seizure  ? Migraines   ? "haven't had one since I was a kid" (06/05/2013)  ? Sleep apnea   ? CPAP  ? ? ?Past Surgical History: ?Past Surgical History:  ?Procedure Laterality Date  ? CARPAL TUNNEL RELEASE Left 2011?  ? CARPAL TUNNEL RELEASE Right 01/27/2017  ? Procedure: CARPAL TUNNEL RELEASE;  Surgeon: Erin SonsHarold Kernodle, MD;  Location: [facility] ORS;  Service: Orthopedics;  Laterality: Right;  ? COLON SURGERY  2014  ? bowel resection/colostomy  ? COLOSTOMY REVISION N/A 11/07/2013  ? Procedure: COLON RESECTION SIGMOID;  Surgeon: Cherylynn RidgesJames O Wyatt, MD;  [facility];  Service: General;  Laterality: N/A;  ? ctr Left   ? GASTRIC ROUX-EN-Y N/A 01/29/2020  ? Procedure: LAPAROSCOPIC ROUX-EN-Y GASTRIC BYPASS WITH UPPER ENDOSCOPY, REDUCTION of Incarcerated Hernia, Laproscopic Open Repair of Large Lower 10 cm hernia;  Surgeon:  Luretha MurphyMartin, Matthew, MD;  Location: [facility];  Service: General;  Laterality: N/A;  ? ? ?Social History: ?Social History  ? ?Socioeconomic History  ? Marital status: Married  ?  Spouse name: Not on file  ? Number of children: Not on file  ? Years of education: Not on file  ? Highest education level: Not on file  ?Occupational History  ? Not on file  ?Tobacco Use  ? Smoking status: Every Day  ?  Packs/day: 1.00  ?  Years: 18.00  ?  Pack years: 18.00  ?  Types: Cigarettes  ? Smokeless tobacco: Former  ?  Quit date: 08/2021  ?  Vaping Use  ? Vaping Use: Never used  ?Substance and Sexual Activity  ? Alcohol use: No  ? Drug use: No  ? Sexual activity: Yes  ?  Birth control/protection: I.U.D.  ?Other Topics Concern  ? Not on file  ?Social History Narrative  ? Not on file  ? ?Social Determinants of Health  ? ?Financial Resource Strain: Not on file  ?Food Insecurity: Not on file  ?Transportation Needs: Not on file  ?Physical Activity: Not on file  ?Stress: Not on file  ?Social Connections: Not on file  ?Intimate Partner Violence: Not on file  ? ? ?Family History: ?Family History  ?Problem Relation Age of Onset  ? Cancer Maternal Grandfather   ?     prostate  ? Cancer Maternal Aunt   ?     breast  ? Breast cancer Maternal Aunt   ? Breast cancer Paternal Aunt   ? ? ?Review of Systems: ?ROS ?Denies recent chest pain or difficulty breathing, fevers or infection, leg swelling. ? ?Physical Exam: ?Vital Signs ?BP 125/79 (BP Location: Left Arm, Patient Position: Sitting, Cuff Size: Small)   Pulse 79   Ht 5\' 5"  (1.651 m)   Wt 200 lb 12.8 oz (91.1 kg)   SpO2 97%   BMI 33.41 kg/m?  ? ?Physical Exam ?Constitutional:   ?   General: Not in acute distress. ?   Appearance: Normal appearance. Not ill-appearing.  ?HENT:  ?   Head: Normocephalic and atraumatic.  ?Eyes:  ?   Pupils: Pupils are equal, round. ?Cardiovascular:  ?   Rate and Rhythm: Normal rate. ?   Pulses: Normal pulses.  ?Pulmonary:  ?   Effort: No respiratory distress or increased  work of breathing.  Speaks in full sentences. ?Abdominal:  ?   General: Abdomen is flat. No distension.   ?Musculoskeletal: Normal range of motion. No lower extremity swelling or edema. No varicosities. ?Skin: ?   Gener

## 2022-03-11 ENCOUNTER — Other Ambulatory Visit: Payer: Self-pay

## 2022-03-11 ENCOUNTER — Ambulatory Visit (INDEPENDENT_AMBULATORY_CARE_PROVIDER_SITE_OTHER): Payer: Medicare Other | Admitting: Physician Assistant

## 2022-03-11 VITALS — BP 125/79 | HR 79 | Ht 65.0 in | Wt 200.8 lb

## 2022-03-11 DIAGNOSIS — M793 Panniculitis, unspecified: Secondary | ICD-10-CM

## 2022-03-11 DIAGNOSIS — F410 Panic disorder [episodic paroxysmal anxiety] without agoraphobia: Secondary | ICD-10-CM

## 2022-03-11 HISTORY — DX: Panic disorder (episodic paroxysmal anxiety): F41.0

## 2022-03-11 MED ORDER — ONDANSETRON 4 MG PO TBDP: 4.0000 mg | ORAL_TABLET | Freq: Three times a day (TID) | ORAL | 0 refills | Status: DC | PRN

## 2022-03-11 MED ORDER — OXYCODONE HCL 5 MG PO TABS: 5.0000 mg | ORAL_TABLET | Freq: Four times a day (QID) | ORAL | 0 refills | Status: AC | PRN

## 2022-03-16 DIAGNOSIS — G4733 Obstructive sleep apnea (adult) (pediatric): Secondary | ICD-10-CM | POA: Diagnosis not present

## 2022-03-18 ENCOUNTER — Telehealth: Payer: Self-pay | Admitting: Plastic Surgery

## 2022-03-18 NOTE — Telephone Encounter (Signed)
Called patient to advise that her surgery has been moved to [facility] MAIN due to the conversation she had with Gerre Pebbles during her Pre Op Appt. Advised her of the arrival time. Patient wanted to make sure the orders were placed for her surgery so the pre admit team knew. I assured patient I would send message right now to PA.  ?

## 2022-03-24 NOTE — Progress Notes (Signed)
Surgical Instructions ? ? ? Your procedure is scheduled on Friday April 21st. ? Report to [facility] Main Entrance "A" at 8 A.M., then check in with the Admitting office. ? Call this number if you have problems the morning of surgery: ? [phone] ? ? If you have any questions prior to your surgery date call [phone]: Open Monday-Friday 8am-4pm ? ? ? Remember: ? Do not eat after midnight the night before your surgery ? ?You may drink clear liquids until 7am the morning of your surgery.   ?Clear liquids allowed are: Water, Non-Citrus Juices (without pulp), Carbonated Beverages, Clear Tea, Black Coffee ONLY (NO MILK, CREAM OR POWDERED CREAMER of any kind), and Gatorade ?  ? Take these medicines the morning of surgery with A SIP OF WATER: ?Fluticasone-Salmeterol (ADVAIR) 250-50 MCG/DOSE AEPB -- please bring with you to the hospital ?lamoTRIgine (LAMICTAL) 200 MG tablet ?PROAIR HFA 108 (90 Base) MCG/ACT inhaler-- please bring with you to the hospital ? ?IF NEEDED  ?acetaminophen (TYLENOL) 160 MG/5ML liquid ?ondansetron (ZOFRAN-ODT) 4 MG disintegrating tablet ? ? ?As of today, STOP taking any Aspirin (unless otherwise instructed by your surgeon) Aleve, Naproxen, Ibuprofen, Motrin, Advil, Goody's, BC's, all herbal medications, fish oil, and all vitamins. ? ?         ?Do not wear jewelry or makeup ?Do not wear lotions, powders, perfumes, or deodorant. ?Do not shave 48 hours prior to surgery.   ?Do not bring valuables to the hospital. ?Do not wear nail polish, gel polish, artificial nails, or any other type of covering on natural nails (fingers and toes) ?If you have artificial nails or gel coating that need to be removed by a nail salon, please have this removed prior to surgery. Artificial nails or gel coating may interfere with anesthesia's ability to adequately monitor your vital signs. ? ?[facility] is not responsible for any belongings or valuables. .  ? ?Do NOT Smoke (Tobacco/Vaping)  24 hours prior to your  procedure ? ?If you use a CPAP at night, you may bring your mask for your overnight stay. ?  ?Contacts, glasses, hearing aids, dentures or partials may not be worn into surgery, please bring cases for these belongings ?  ?For patients admitted to the hospital, discharge time will be determined by your treatment team. ?  ?Patients discharged the day of surgery will not be allowed to drive home, and someone needs to stay with them for 24 hours. ? ? ?SURGICAL WAITING ROOM VISITATION ?Patients having surgery or a procedure in a hospital may have two support people. ?Children under the age of 36 must have an adult with them who is not the patient. ?They may stay in the waiting area during the procedure and may switch out with other visitors. If the patient needs to stay at the hospital during part of their recovery, the visitor guidelines for inpatient rooms apply. ? ?Please refer to the [place] website for the visitor guidelines for Inpatients (after your surgery is over and you are in a regular room).  ? ? ? ? ? ?Special instructions:   ? ?Oral Hygiene is also important to reduce your risk of infection.  Remember - BRUSH YOUR TEETH THE MORNING OF SURGERY WITH YOUR REGULAR TOOTHPASTE ? ? ?[facility]- Preparing For Surgery ? ?Before surgery, you can play an important role. Because skin is not sterile, your skin needs to be as free of germs as possible. You can reduce the number of germs on your skin by washing with  CHG (chlorahexidine gluconate) Soap before surgery.  CHG is an antiseptic cleaner which kills germs and bonds with the skin to continue killing germs even after washing.   ? ? ?Please do not use if you have an allergy to CHG or antibacterial soaps. If your skin becomes reddened/irritated stop using the CHG.  ?Do not shave (including legs and underarms) for at least 48 hours prior to first CHG shower. It is OK to shave your face. ? ?Please follow these instructions carefully. ?  ? ? Shower the NIGHT BEFORE  SURGERY and the MORNING OF SURGERY with CHG Soap.  ? If you chose to wash your hair, wash your hair first as usual with your normal shampoo. After you shampoo, rinse your hair and body thoroughly to remove the shampoo.  Then [employer] and genitals (private parts) with your normal soap and rinse thoroughly to remove soap. ? ?After that Use CHG Soap as you would any other liquid soap. You can apply CHG directly to the skin and wash gently with a scrungie or a clean washcloth.  ? ?Apply the CHG Soap to your body ONLY FROM THE NECK DOWN.  Do not use on open wounds or open sores. Avoid contact with your eyes, ears, mouth and genitals (private parts). Wash Face and genitals (private parts)  with your normal soap.  ? ?Wash thoroughly, paying special attention to the area where your surgery will be performed. ? ?Thoroughly rinse your body with warm water from the neck down. ? ?DO NOT shower/wash with your normal soap after using and rinsing off the CHG Soap. ? ?Pat yourself dry with a CLEAN TOWEL. ? ?Wear CLEAN PAJAMAS to bed the night before surgery ? ?Place CLEAN SHEETS on your bed the night before your surgery ? ?DO NOT SLEEP WITH PETS. ? ? ?Day of Surgery: ? ?Take a shower with CHG soap. ?Wear Clean/Comfortable clothing the morning of surgery ?Do not apply any deodorants/lotions.   ?Remember to brush your teeth WITH YOUR REGULAR TOOTHPASTE. ? ? ? ?If you received a COVID test during your pre-op visit  it is requested that you wear a mask when out in public, stay away from anyone that may not be feeling well and notify your surgeon if you develop symptoms. If you have been in contact with anyone that has tested positive in the last 10 days please notify you surgeon. ? ?  ?Please read over the following fact sheets that you were given.  ? ?

## 2022-03-25 ENCOUNTER — Encounter (HOSPITAL_COMMUNITY): Payer: Self-pay

## 2022-03-25 ENCOUNTER — Other Ambulatory Visit: Payer: Self-pay

## 2022-03-25 ENCOUNTER — Encounter (HOSPITAL_COMMUNITY)
Admission: RE | Admit: 2022-03-25 | Discharge: 2022-03-25 | Disposition: A | Discharge status: Home / Self Care | Payer: Medicare Other | Source: Ambulatory Visit | Attending: Surgery | Admitting: Surgery

## 2022-03-25 VITALS — BP 120/78 | HR 88 | Temp 98.2°F | Resp 18 | Ht 65.0 in | Wt 200.6 lb

## 2022-03-25 DIAGNOSIS — F319 Bipolar disorder, unspecified: Secondary | ICD-10-CM | POA: Insufficient documentation

## 2022-03-25 DIAGNOSIS — G4733 Obstructive sleep apnea (adult) (pediatric): Secondary | ICD-10-CM | POA: Insufficient documentation

## 2022-03-25 DIAGNOSIS — K432 Incisional hernia without obstruction or gangrene: Secondary | ICD-10-CM | POA: Diagnosis not present

## 2022-03-25 DIAGNOSIS — R569 Unspecified convulsions: Secondary | ICD-10-CM

## 2022-03-25 DIAGNOSIS — Z87891 Personal history of nicotine dependence: Secondary | ICD-10-CM | POA: Insufficient documentation

## 2022-03-25 DIAGNOSIS — Z01818 Encounter for other preprocedural examination: Secondary | ICD-10-CM

## 2022-03-25 DIAGNOSIS — J45909 Unspecified asthma, uncomplicated: Secondary | ICD-10-CM | POA: Insufficient documentation

## 2022-03-25 DIAGNOSIS — Z01812 Encounter for preprocedural laboratory examination: Secondary | ICD-10-CM | POA: Diagnosis not present

## 2022-03-25 LAB — BASIC METABOLIC PANEL
Anion gap: 7 (ref 5–15)
BUN: 9 mg/dL (ref 6–20)
CO2: 25 mmol/L (ref 22–32)
Calcium: 9 mg/dL (ref 8.9–10.3)
Chloride: 106 mmol/L (ref 98–111)
Creatinine, Ser: 0.61 mg/dL (ref 0.44–1.00)
GFR, Estimated: >60 mL/min (ref >60)
Glucose, Bld: 79 mg/dL (ref 70–99)
Potassium: 4.5 mmol/L (ref 3.5–5.1)
Sodium: 138 mmol/L (ref 135–145)

## 2022-03-25 LAB — CBC
HCT: 43.8 % (ref 36.0–46.0)
Hemoglobin: 14.3 g/dL (ref 12.0–15.0)
MCH: 28.1 pg (ref 26.0–34.0)
MCHC: 32.6 g/dL (ref 30.0–36.0)
MCV: 86.2 fL (ref 80.0–100.0)
Platelets: 292 10*3/uL (ref 150–400)
RBC: 5.08 MIL/uL (ref 3.87–5.11)
RDW: 13.4 % (ref 11.5–15.5)
WBC: 13.9 10*3/uL — ABNORMAL HIGH (ref 4.0–10.5)
nRBC: 0 % (ref 0.0–0.2)

## 2022-03-25 NOTE — Progress Notes (Signed)
Pt states she can not have an IV prior to surgery due to a hx of thrombophlebitis. Pt had central line placed for previous surgery. Jeneen Rinks, Anesthesia PA, made aware of this and her chart will go to anesthesia.  ?

## 2022-03-25 NOTE — Progress Notes (Signed)
PCP - Orson Eva, NP ?Cardiologist - denies ? ?PPM/ICD - denies ? ? ?Chest x-ray - 01/29/20 ?EKG - 12/20/18 ?Stress Test - denies ?ECHO - denies ?Cardiac Cath - denies ? ?Sleep Study - sleep study 5+ years ago per pt, OSA+ ?CPAP - nightly ? ?DM- denies ? ?ASA/Blood Thinner Instructions: n/a ? ? ?ERAS Protcol - yes, no drink ? ? ?COVID TEST- n/a ? ? ?Anesthesia review: yes, pt states she has to have a central line or PICC. Fayrene Fearing, Anesthesia PA, aware. ? ?Patient denies shortness of breath, fever, cough and chest pain at PAT appointment ? ? ?All instructions explained to the patient, with a verbal understanding of the material. Patient agrees to go over the instructions while at home for a better understanding. Patient also instructed to notify surgeon of any contact with COVID+ person or if she develops any symptoms. The opportunity to ask questions was provided. ?  ?

## 2022-03-26 NOTE — Progress Notes (Signed)
Pt has an allergy to hand sanitizer. She says the CHG wipes, alcohol, and the CHG mouthwash do not bother her but the hand sanitizer causes hives and swelling. ?

## 2022-03-27 NOTE — Anesthesia Preprocedure Evaluation (Addendum)
Anesthesia Evaluation  ?Patient identified by MRN, date of birth, ID band ?Patient awake ? ? ? ?Reviewed: ?Allergy & Precautions, NPO status , Patient's Chart, lab work & pertinent test results ? ?Airway ?Mallampati: I ? ?TM Distance: >3 FB ?Neck ROM: Full ? ? ? Dental ?no notable dental hx. ?(+) Teeth Intact, Dental Advisory Given ?  ?Pulmonary ?asthma , sleep apnea , former smoker,  ?  ?Pulmonary exam normal ?breath sounds clear to auscultation ? ? ? ? ? ? Cardiovascular ?negative cardio ROS ?Normal cardiovascular exam ?Rhythm:Regular Rate:Normal ? ? ?  ?Neuro/Psych ? Headaches, Seizures -,  PSYCHIATRIC DISORDERS Depression Bipolar Disorder   ? GI/Hepatic ?negative GI ROS, Neg liver ROS,   ?Endo/Other  ?diabetes ? Renal/GU ?negative Renal ROS  ?negative genitourinary ?  ?Musculoskeletal ?negative musculoskeletal ROS ?(+)  ? Abdominal ?  ?Peds ? Hematology ?negative hematology ROS ?(+)   ?Anesthesia Other Findings ? ? Reproductive/Obstetrics ? ?  ? ? ? ? ? ? ? ? ? ? ? ? ? ?  ?  ? ? ? ? ? ? ?Anesthesia Physical ?Anesthesia Plan ? ?ASA: 3 ? ?Anesthesia Plan: General  ? ?Post-op Pain Management:   ? ?Induction: Intravenous ? ?PONV Risk Score and Plan: 3 and Midazolam, Dexamethasone and Ondansetron ? ?Airway Management Planned: Oral ETT ? ?Additional Equipment:  ? ?Intra-op Plan:  ? ?Post-operative Plan: Extubation in OR ? ?Informed Consent: I have reviewed the patients History and Physical, chart, labs and discussed the procedure including the risks, benefits and alternatives for the proposed anesthesia with the patient or authorized representative who has indicated his/her understanding and acceptance.  ? ? ? ?Dental advisory given ? ?Plan Discussed with: CRNA ? ?Anesthesia Plan Comments: ( ?)  ? ? ? ? ? ?Anesthesia Quick Evaluation ? ?

## 2022-03-27 NOTE — Progress Notes (Addendum)
Anesthesia Chart Review: ? Case: [id number] Date/Time: 04/03/22 0945  ? Procedures:  ?    OPEN INCISIONAL HERNIA REPAIR, RECURRENT WITH MESH ?    PANNICULECTOMY  ? Anesthesia type: General  ? Pre-op diagnosis: INCISIONAL HERNIA  ? [facility] / [facility]  ? Surgeons: Stechschulte, Hyman Hopes, MD; Allena Napoleon, MD  ? ?  ? ? ?DISCUSSION: Patient is a 41 year old female scheduled for the above procedure. ? 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 ? ?She has an allergy to a chemical in hand sanitizer. She reported hives, swelling, and blisters after contact. When asked if she had been referred to a dermatologist or immunologist, she reported her PCP applied several different topical cleansers and it was the hand sanitizer that caused blisters. She requests that staff wash their hands and not use hand sanitizer. She also said that she has been able to safely use CHG wipes, alcohol, and the CHG mouthwash in the past without reaction. ? ?She reported poor venous access a and issues with thrombophlebitis, and has required PICC line or CVL for procedures in the past. A right IJ cental line was placed by anesthesiologist for 01/29/20 bariatric surgery.  ? ?Updated anesthesiologist Leslye Peer, MD regarding allergy to hand sanitizer and previous need for CVL for IV access for surgery. Message also sent to surgeons. ? ? ?VS: BP 120/78   Pulse 88   Temp 36.8 ?C (Oral)   Resp 18   Ht 5\' 5"  (1.651 m)   Wt 91 kg   SpO2 99%   BMI 33.38 kg/m?  ? ? ?PROVIDERS: ? 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 Evaluation in 2021 for erythrocytosis/leukocytosis found incidentally on blood work.  Findings  felt likely reactive from smoking/OSA, but additional labs ordered. Also advised smoking cessation. Per 08/11/21 telephone encounter, "On 8/29-chronic mild elevation of white count likely reactive.  Reviewed the blood work at PCPs office-no further work-up recommended at this time.  Follow-up as needed". ? ? ?LABS: Preoperative labs noted. WBC 13.9K (see above, hematology evaluation in 2021-2022). A1c 5.9% 08/01/21 ([facility], scanned under Media tab). ?(all labs ordered are listed, but only abnormal results are displayed) ? ?Labs Reviewed  ?CBC - Abnormal; Notable for the following components:  ?    Result Value  ? WBC 13.9 (*)   ? All other components within normal limits  ?BASIC METABOLIC PANEL  ? ? ?EKG: Last noted EKG 12/20/18: Normal sinus rhythm, low voltage QRS, septal infarct (age-indeterminate), nonspecific T wave abnormality in anterior leads. ? ? ?CV: N/A ? ?Past Medical History:  ?Diagnosis Date  ? Asthma   ? Bipolar disorder (HCC)   ? Depression   ? Diabetes mellitus without complication (HCC)   ? This was no longer an issue after gastric bypass, per pt- not diabetic  ? Diverticulosis   ? Epilepsy (HCC)   ? 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 last seizure Sunday 01/24/17.anxiety precipitates seizure  ? Migraines   ? "haven't had one since I was a kid" (06/05/2013)  ? Sleep apnea   ? CPAP  ? ? ?Past Surgical History:  ?Procedure Laterality Date  ? CARPAL TUNNEL RELEASE Left 2011?  ? CARPAL TUNNEL RELEASE Right 01/27/2017  ? Procedure: CARPAL TUNNEL  RELEASE;  Surgeon: Erin Sons, MD;  Location: [facility] ORS;  Service: Orthopedics;  Laterality: Right;  ? COLON SURGERY  2014  ? bowel resection/colostomy  ? COLOSTOMY REVISION N/A 11/07/2013  ? Procedure: COLON RESECTION SIGMOID;  Surgeon: [name], MD;  Location: [facility] OR;  Service: General;  Laterality: N/A;  ? GASTRIC ROUX-EN-Y N/A 01/29/2020  ? Procedure: LAPAROSCOPIC ROUX-EN-Y GASTRIC BYPASS WITH UPPER ENDOSCOPY, REDUCTION of Incarcerated Hernia,  Laproscopic Open Repair of Large Lower 10 cm hernia;  Surgeon: Luretha Murphy, MD;  Location: [facility] ORS;  Service: General;  Laterality: N/A;  ? ? ?MEDICATIONS: ? acetaminophen (TYLENOL) 160 MG/5ML liquid  ? atorvastatin (LIPITOR) 40 MG tablet  ? CALCIUM PO  ? cetirizine (ZYRTEC) 10 MG tablet  ? Cholecalciferol (VITAMIN D) 50 MCG (2000 UT) tablet  ? citalopram (CELEXA) 40 MG tablet  ? Fluticasone-Salmeterol (ADVAIR) 250-50 MCG/DOSE AEPB  ? gabapentin (NEURONTIN) 300 MG capsule  ? lamoTRIgine (LAMICTAL) 200 MG tablet  ? levETIRAcetam (KEPPRA) 500 MG tablet  ? losartan (COZAAR) 25 MG tablet  ? Multiple Vitamins-Minerals (BARIATRIC MULTIVITAMINS/IRON PO)  ? ondansetron (ZOFRAN-ODT) 4 MG disintegrating tablet  ? PROAIR HFA 108 (90 Base) MCG/ACT inhaler  ? VITAMIN A PO  ? vitamin B-12 (CYANOCOBALAMIN) 1000 MCG tablet  ? vitamin C (ASCORBIC ACID) 500 MG tablet  ? ?No current facility-administered medications for this encounter.  ? ? ?Shonna Chock, PA-C ?Surgical Short Stay/Anesthesiology ?[facility] Phone [phone] ?[facility] Phone [phone] ?03/27/2022 2:43 PM ? ? ? ? ?

## 2022-04-03 ENCOUNTER — Observation Stay (HOSPITAL_COMMUNITY)
Admission: RE | Admit: 2022-04-03 | Discharge: 2022-04-05 | Disposition: A | Discharge status: Home / Self Care | Payer: Medicare Other | Source: Ambulatory Visit | Attending: Surgery | Admitting: Surgery

## 2022-04-03 ENCOUNTER — Encounter (HOSPITAL_COMMUNITY): Payer: Self-pay | Admitting: Surgery

## 2022-04-03 ENCOUNTER — Encounter (HOSPITAL_COMMUNITY)
Admission: RE | Disposition: A | Discharge status: Home / Self Care | Payer: Self-pay | Source: Ambulatory Visit | Attending: Surgery

## 2022-04-03 ENCOUNTER — Other Ambulatory Visit: Payer: Self-pay

## 2022-04-03 ENCOUNTER — Ambulatory Visit (HOSPITAL_COMMUNITY): Payer: Medicare Other | Admitting: Vascular Surgery

## 2022-04-03 ENCOUNTER — Ambulatory Visit (HOSPITAL_BASED_OUTPATIENT_CLINIC_OR_DEPARTMENT_OTHER): Payer: Medicare Other | Admitting: Physician Assistant

## 2022-04-03 DIAGNOSIS — Z87891 Personal history of nicotine dependence: Secondary | ICD-10-CM | POA: Diagnosis not present

## 2022-04-03 DIAGNOSIS — K430 Incisional hernia with obstruction, without gangrene: Secondary | ICD-10-CM | POA: Diagnosis not present

## 2022-04-03 DIAGNOSIS — G4730 Sleep apnea, unspecified: Secondary | ICD-10-CM | POA: Diagnosis not present

## 2022-04-03 DIAGNOSIS — K4031 Unilateral inguinal hernia, with obstruction, without gangrene, recurrent: Secondary | ICD-10-CM | POA: Diagnosis not present

## 2022-04-03 DIAGNOSIS — K432 Incisional hernia without obstruction or gangrene: Secondary | ICD-10-CM | POA: Diagnosis not present

## 2022-04-03 DIAGNOSIS — Z933 Colostomy status: Secondary | ICD-10-CM | POA: Diagnosis not present

## 2022-04-03 DIAGNOSIS — Z9884 Bariatric surgery status: Secondary | ICD-10-CM | POA: Diagnosis not present

## 2022-04-03 DIAGNOSIS — M793 Panniculitis, unspecified: Secondary | ICD-10-CM | POA: Diagnosis not present

## 2022-04-03 DIAGNOSIS — F319 Bipolar disorder, unspecified: Secondary | ICD-10-CM

## 2022-04-03 DIAGNOSIS — Z79899 Other long term (current) drug therapy: Secondary | ICD-10-CM | POA: Insufficient documentation

## 2022-04-03 DIAGNOSIS — E119 Type 2 diabetes mellitus without complications: Secondary | ICD-10-CM | POA: Insufficient documentation

## 2022-04-03 DIAGNOSIS — J45909 Unspecified asthma, uncomplicated: Secondary | ICD-10-CM | POA: Diagnosis not present

## 2022-04-03 DIAGNOSIS — L905 Scar conditions and fibrosis of skin: Secondary | ICD-10-CM | POA: Diagnosis not present

## 2022-04-03 HISTORY — PX: PANNICULECTOMY: SHX5360

## 2022-04-03 HISTORY — PX: INCISIONAL HERNIA REPAIR: SHX193

## 2022-04-03 HISTORY — PX: INSERTION OF MESH: SHX5868

## 2022-04-03 LAB — GLUCOSE, CAPILLARY: Glucose-Capillary: 142 mg/dL — ABNORMAL HIGH (ref 70–99)

## 2022-04-03 LAB — CREATININE, SERUM
Creatinine, Ser: 0.71 mg/dL (ref 0.44–1.00)
GFR, Estimated: >60 mL/min (ref >60)

## 2022-04-03 SURGERY — REPAIR, HERNIA, INCISIONAL
Anesthesia: General | Site: Abdomen

## 2022-04-03 MED ORDER — CHLORHEXIDINE GLUCONATE 0.12 % MT SOLN
15.0000 mL | Freq: Once | OROMUCOSAL | Status: AC
  Administered 2022-04-03: 15 mL via OROMUCOSAL
  Filled 2022-04-03: qty 15

## 2022-04-03 MED ORDER — EPINEPHRINE PF 1 MG/ML IJ SOLN
INTRAMUSCULAR | Status: DC | PRN
  Administered 2022-04-03: 2000 mL

## 2022-04-03 MED ORDER — METHOCARBAMOL 1000 MG/10ML IJ SOLN
500.0000 mg | Freq: Four times a day (QID) | INTRAMUSCULAR | Status: DC | PRN
  Filled 2022-04-03: qty 5

## 2022-04-03 MED ORDER — BUPIVACAINE LIPOSOME 1.3 % IJ SUSP
INTRAMUSCULAR | Status: AC
  Filled 2022-04-03: qty 20

## 2022-04-03 MED ORDER — HYDROMORPHONE HCL 1 MG/ML IJ SOLN: 1.0000 mg | INTRAMUSCULAR | Status: DC | PRN

## 2022-04-03 MED ORDER — BUPIVACAINE-EPINEPHRINE (PF) 0.25% -1:200000 IJ SOLN
INTRAMUSCULAR | Status: AC
  Filled 2022-04-03: qty 30

## 2022-04-03 MED ORDER — LIDOCAINE HCL (CARDIAC) PF 100 MG/5ML IV SOSY
PREFILLED_SYRINGE | INTRAVENOUS | Status: DC | PRN
  Administered 2022-04-03: 60 mg via INTRATRACHEAL

## 2022-04-03 MED ORDER — LOSARTAN POTASSIUM 25 MG PO TABS
25.0000 mg | ORAL_TABLET | Freq: Every day | ORAL | Status: DC
  Administered 2022-04-03 – 2022-04-04 (×2): 25 mg via ORAL
  Filled 2022-04-03 (×2): qty 1

## 2022-04-03 MED ORDER — DOCUSATE SODIUM 100 MG PO CAPS
100.0000 mg | ORAL_CAPSULE | Freq: Two times a day (BID) | ORAL | Status: DC
Start: 2022-04-03 — End: 2022-04-05
  Administered 2022-04-03 – 2022-04-05 (×4): 100 mg via ORAL
  Filled 2022-04-03 (×4): qty 1

## 2022-04-03 MED ORDER — BUPIVACAINE HCL 0.5 % IJ SOLN
INTRAMUSCULAR | Status: AC
  Filled 2022-04-03: qty 1

## 2022-04-03 MED ORDER — ORAL CARE MOUTH RINSE: 15.0000 mL | Freq: Once | OROMUCOSAL | Status: AC

## 2022-04-03 MED ORDER — MIDAZOLAM HCL 2 MG/2ML IJ SOLN
INTRAMUSCULAR | Status: DC | PRN
  Administered 2022-04-03: 2 mg via INTRAVENOUS

## 2022-04-03 MED ORDER — HYDROMORPHONE HCL 1 MG/ML IJ SOLN
INTRAMUSCULAR | Status: AC
  Filled 2022-04-03: qty 0.5

## 2022-04-03 MED ORDER — BUPIVACAINE LIPOSOME 1.3 % IJ SUSP
20.0000 mL | Freq: Once | INTRAMUSCULAR | Status: DC
  Filled 2022-04-03: qty 20

## 2022-04-03 MED ORDER — PHENYLEPHRINE HCL-NACL 20-0.9 MG/250ML-% IV SOLN
INTRAVENOUS | Status: DC | PRN
  Administered 2022-04-03: 25 ug/min via INTRAVENOUS

## 2022-04-03 MED ORDER — GABAPENTIN 300 MG PO CAPS
300.0000 mg | ORAL_CAPSULE | ORAL | Status: AC
  Administered 2022-04-03: 300 mg via ORAL
  Filled 2022-04-03: qty 1

## 2022-04-03 MED ORDER — HYDROMORPHONE HCL 1 MG/ML IJ SOLN
INTRAMUSCULAR | Status: AC
Start: 2022-04-03 — End: 2022-04-04
  Filled 2022-04-03: qty 1

## 2022-04-03 MED ORDER — DEXAMETHASONE SODIUM PHOSPHATE 10 MG/ML IJ SOLN
INTRAMUSCULAR | Status: AC
  Filled 2022-04-03: qty 1

## 2022-04-03 MED ORDER — ROCURONIUM BROMIDE 10 MG/ML (PF) SYRINGE
PREFILLED_SYRINGE | INTRAVENOUS | Status: AC
  Filled 2022-04-03: qty 10

## 2022-04-03 MED ORDER — HYDROMORPHONE HCL 1 MG/ML IJ SOLN
INTRAMUSCULAR | Status: DC | PRN
  Administered 2022-04-03: .5 mg via INTRAVENOUS

## 2022-04-03 MED ORDER — SUGAMMADEX SODIUM 200 MG/2ML IV SOLN
INTRAVENOUS | Status: DC | PRN
  Administered 2022-04-03: 200 mg via INTRAVENOUS

## 2022-04-03 MED ORDER — LACTATED RINGERS IV SOLN: INTRAVENOUS | Status: DC

## 2022-04-03 MED ORDER — ONDANSETRON HCL 4 MG/2ML IJ SOLN: 4.0000 mg | Freq: Four times a day (QID) | INTRAMUSCULAR | Status: DC | PRN

## 2022-04-03 MED ORDER — LORATADINE 10 MG PO TABS
10.0000 mg | ORAL_TABLET | Freq: Every day | ORAL | Status: DC
  Administered 2022-04-03 – 2022-04-05 (×3): 10 mg via ORAL
  Filled 2022-04-03 (×3): qty 1

## 2022-04-03 MED ORDER — MOMETASONE FURO-FORMOTEROL FUM 200-5 MCG/ACT IN AERO
2.0000 | INHALATION_SPRAY | Freq: Two times a day (BID) | RESPIRATORY_TRACT | Status: DC
Start: 2022-04-03 — End: 2022-04-05
  Administered 2022-04-04 – 2022-04-05 (×3): 2 via RESPIRATORY_TRACT
  Filled 2022-04-03: qty 8.8

## 2022-04-03 MED ORDER — KETAMINE HCL 10 MG/ML IJ SOLN
INTRAMUSCULAR | Status: DC | PRN
Start: 2022-04-03 — End: 2022-04-03
  Administered 2022-04-03: 20 mg via INTRAVENOUS
  Administered 2022-04-03 (×3): 10 mg via INTRAVENOUS

## 2022-04-03 MED ORDER — FENTANYL CITRATE (PF) 250 MCG/5ML IJ SOLN
INTRAMUSCULAR | Status: DC | PRN
  Administered 2022-04-03: 50 ug via INTRAVENOUS
  Administered 2022-04-03 (×2): 100 ug via INTRAVENOUS

## 2022-04-03 MED ORDER — PROPOFOL 10 MG/ML IV BOLUS
INTRAVENOUS | Status: DC | PRN
  Administered 2022-04-03: 150 mg via INTRAVENOUS
  Administered 2022-04-03: 20 mg via INTRAVENOUS

## 2022-04-03 MED ORDER — KETAMINE HCL 50 MG/5ML IJ SOSY
PREFILLED_SYRINGE | INTRAMUSCULAR | Status: AC
  Filled 2022-04-03: qty 5

## 2022-04-03 MED ORDER — ENOXAPARIN SODIUM 40 MG/0.4ML IJ SOSY
40.0000 mg | PREFILLED_SYRINGE | INTRAMUSCULAR | Status: DC
  Administered 2022-04-04 – 2022-04-05 (×2): 40 mg via SUBCUTANEOUS
  Filled 2022-04-03 (×2): qty 0.4

## 2022-04-03 MED ORDER — CELECOXIB 200 MG PO CAPS
400.0000 mg | ORAL_CAPSULE | ORAL | Status: AC
  Administered 2022-04-03: 400 mg via ORAL
  Filled 2022-04-03: qty 2

## 2022-04-03 MED ORDER — OXYCODONE HCL 5 MG PO TABS
10.0000 mg | ORAL_TABLET | ORAL | Status: DC | PRN
  Administered 2022-04-04 (×4): 10 mg via ORAL
  Filled 2022-04-03 (×4): qty 2

## 2022-04-03 MED ORDER — SODIUM BICARBONATE 4.2 % IV SOLN
Freq: Once | INTRAVENOUS | Status: DC
  Filled 2022-04-03 (×2): qty 50

## 2022-04-03 MED ORDER — CEFAZOLIN SODIUM-DEXTROSE 2-4 GM/100ML-% IV SOLN
2.0000 g | INTRAVENOUS | Status: AC
  Administered 2022-04-03: 2 g via INTRAVENOUS
  Filled 2022-04-03: qty 100

## 2022-04-03 MED ORDER — DEXAMETHASONE SODIUM PHOSPHATE 10 MG/ML IJ SOLN
INTRAMUSCULAR | Status: DC | PRN
Start: 2022-04-03 — End: 2022-04-03
  Administered 2022-04-03: 10 mg via INTRAVENOUS

## 2022-04-03 MED ORDER — ACETAMINOPHEN 500 MG PO TABS
1000.0000 mg | ORAL_TABLET | ORAL | Status: AC
  Administered 2022-04-03: 1000 mg via ORAL
  Filled 2022-04-03: qty 2

## 2022-04-03 MED ORDER — ALBUTEROL SULFATE (2.5 MG/3ML) 0.083% IN NEBU
2.5000 mg | INHALATION_SOLUTION | Freq: Four times a day (QID) | RESPIRATORY_TRACT | Status: DC | PRN
  Administered 2022-04-04 – 2022-04-05 (×2): 2.5 mg via RESPIRATORY_TRACT
  Filled 2022-04-03 (×2): qty 3

## 2022-04-03 MED ORDER — MIDAZOLAM HCL 2 MG/2ML IJ SOLN
INTRAMUSCULAR | Status: AC
  Filled 2022-04-03: qty 2

## 2022-04-03 MED ORDER — ONDANSETRON HCL 4 MG/2ML IJ SOLN
INTRAMUSCULAR | Status: AC
  Filled 2022-04-03: qty 2

## 2022-04-03 MED ORDER — FENTANYL CITRATE (PF) 250 MCG/5ML IJ SOLN
INTRAMUSCULAR | Status: AC
  Filled 2022-04-03: qty 5

## 2022-04-03 MED ORDER — ROCURONIUM 10MG/ML (10ML) SYRINGE FOR MEDFUSION PUMP - OPTIME
INTRAVENOUS | Status: DC | PRN
  Administered 2022-04-03: 100 mg via INTRAVENOUS
  Administered 2022-04-03: 50 mg via INTRAVENOUS

## 2022-04-03 MED ORDER — PHENYLEPHRINE 80 MCG/ML (10ML) SYRINGE FOR IV PUSH (FOR BLOOD PRESSURE SUPPORT)
PREFILLED_SYRINGE | INTRAVENOUS | Status: AC
  Filled 2022-04-03: qty 10

## 2022-04-03 MED ORDER — SIMETHICONE 80 MG PO CHEW
80.0000 mg | CHEWABLE_TABLET | Freq: Four times a day (QID) | ORAL | Status: DC | PRN
Start: 2022-04-03 — End: 2022-04-05

## 2022-04-03 MED ORDER — ONDANSETRON HCL 4 MG/2ML IJ SOLN
INTRAMUSCULAR | Status: DC | PRN
  Administered 2022-04-03: 4 mg via INTRAVENOUS

## 2022-04-03 MED ORDER — ACETAMINOPHEN 325 MG PO TABS
650.0000 mg | ORAL_TABLET | Freq: Four times a day (QID) | ORAL | Status: DC
  Administered 2022-04-03 – 2022-04-05 (×7): 650 mg via ORAL
  Filled 2022-04-03 (×7): qty 2

## 2022-04-03 MED ORDER — CHLORHEXIDINE GLUCONATE CLOTH 2 % EX PADS
6.0000 | MEDICATED_PAD | Freq: Every day | CUTANEOUS | Status: DC
  Administered 2022-04-04 – 2022-04-05 (×2): 6 via TOPICAL

## 2022-04-03 MED ORDER — ALBUMIN HUMAN 5 % IV SOLN: INTRAVENOUS | Status: DC | PRN

## 2022-04-03 MED ORDER — HYDROMORPHONE HCL 1 MG/ML IJ SOLN
0.2500 mg | INTRAMUSCULAR | Status: DC | PRN
  Administered 2022-04-03: 0.5 mg via INTRAVENOUS

## 2022-04-03 MED ORDER — PHENYLEPHRINE 80 MCG/ML (10ML) SYRINGE FOR IV PUSH (FOR BLOOD PRESSURE SUPPORT)
PREFILLED_SYRINGE | INTRAVENOUS | Status: DC | PRN
  Administered 2022-04-03: 240 ug via INTRAVENOUS

## 2022-04-03 MED ORDER — CHLORHEXIDINE GLUCONATE 4 % EX LIQD: 60.0000 mL | Freq: Once | CUTANEOUS | Status: DC

## 2022-04-03 MED ORDER — GABAPENTIN 300 MG PO CAPS
300.0000 mg | ORAL_CAPSULE | Freq: Every day | ORAL | Status: DC
  Administered 2022-04-03 – 2022-04-04 (×2): 300 mg via ORAL
  Filled 2022-04-03 (×2): qty 1

## 2022-04-03 MED ORDER — LEVETIRACETAM 500 MG PO TABS
500.0000 mg | ORAL_TABLET | Freq: Every day | ORAL | Status: DC
  Administered 2022-04-03 – 2022-04-04 (×2): 500 mg via ORAL
  Filled 2022-04-03 (×2): qty 1

## 2022-04-03 MED ORDER — CHLORHEXIDINE GLUCONATE 4 % EX LIQD
60.0000 mL | Freq: Once | CUTANEOUS | Status: DC
Start: 2022-04-04 — End: 2022-04-03

## 2022-04-03 MED ORDER — SODIUM BICARBONATE 4.2 % IV SOLN
Freq: Once | INTRAVENOUS | Status: DC
  Filled 2022-04-03: qty 50

## 2022-04-03 MED ORDER — PROPOFOL 10 MG/ML IV BOLUS
INTRAVENOUS | Status: AC
  Filled 2022-04-03: qty 20

## 2022-04-03 MED ORDER — LACTATED RINGERS IV SOLN: INTRAVENOUS | Status: DC | PRN

## 2022-04-03 MED ORDER — LAMOTRIGINE 100 MG PO TABS
200.0000 mg | ORAL_TABLET | Freq: Three times a day (TID) | ORAL | Status: DC
  Administered 2022-04-03 – 2022-04-05 (×6): 200 mg via ORAL
  Filled 2022-04-03 (×6): qty 2

## 2022-04-03 MED ORDER — OXYCODONE HCL 5 MG PO TABS
5.0000 mg | ORAL_TABLET | ORAL | Status: DC | PRN
  Administered 2022-04-03: 5 mg via ORAL
  Filled 2022-04-03 (×3): qty 1

## 2022-04-03 MED ORDER — SODIUM CHLORIDE 0.9 % IR SOLN
Status: DC | PRN
  Administered 2022-04-03: 2000 mL

## 2022-04-03 MED ORDER — ALBUTEROL SULFATE HFA 108 (90 BASE) MCG/ACT IN AERS
INHALATION_SPRAY | RESPIRATORY_TRACT | Status: DC | PRN
  Administered 2022-04-03 (×2): 2 via RESPIRATORY_TRACT

## 2022-04-03 SURGICAL SUPPLY — 90 items
APL PRP STRL LF DISP 70% ISPRP (MISCELLANEOUS) ×4
APPLIER CLIP 9.375 MED OPEN (MISCELLANEOUS)
APR CLP MED 9.3 20 MLT OPN (MISCELLANEOUS)
BAG COUNTER SPONGE SURGICOUNT (BAG) ×8 IMPLANT
BENZOIN TINCTURE PRP APPL 2/3 (GAUZE/BANDAGES/DRESSINGS) ×4 IMPLANT
BINDER ABDOMINAL 12 ML 46-62 (SOFTGOODS) ×4 IMPLANT
BINDER ABDOMINAL 12 XL 75-84 (SOFTGOODS) ×1 IMPLANT
BIOPATCH RED 1 DISK 7.0 (GAUZE/BANDAGES/DRESSINGS) ×10 IMPLANT
BLADE CLIPPER SURG (BLADE) IMPLANT
BLADE SURG 10 STRL SS (BLADE) ×4 IMPLANT
BLADE SURG 11 STRL SS (BLADE) IMPLANT
BLADE SURG 15 STRL LF DISP TIS (BLADE) IMPLANT
BLADE SURG 15 STRL SS (BLADE)
CANISTER SUCT 3000ML PPV (MISCELLANEOUS) ×8 IMPLANT
CHLORAPREP W/TINT 26 (MISCELLANEOUS) ×8 IMPLANT
CLIP APPLIE 9.375 MED OPEN (MISCELLANEOUS) IMPLANT
CONT SPEC PATH 64OZ SNAP LID (MISCELLANEOUS) ×4 IMPLANT
COVER SURGICAL LIGHT HANDLE (MISCELLANEOUS) ×8 IMPLANT
DERMABOND ADVANCED (GAUZE/BANDAGES/DRESSINGS) ×1
DERMABOND ADVANCED .7 DNX12 (GAUZE/BANDAGES/DRESSINGS) ×3 IMPLANT
DRAIN CHANNEL 15F RND FF W/TCR (WOUND CARE) ×8 IMPLANT
DRAIN CHANNEL 19F RND (DRAIN) ×2 IMPLANT
DRAPE HALF SHEET 40X57 (DRAPES) IMPLANT
DRAPE LAPAROSCOPIC ABDOMINAL (DRAPES) ×4 IMPLANT
DRAPE TOP ARMCOVERS (MISCELLANEOUS) ×4 IMPLANT
DRAPE U-SHAPE 76X120 STRL (DRAPES) ×8 IMPLANT
DRAPE UTILITY XL STRL (DRAPES) ×4 IMPLANT
DRSG PAD ABDOMINAL 8X10 ST (GAUZE/BANDAGES/DRESSINGS) ×1 IMPLANT
DRSG TEGADERM 4X4.75 (GAUZE/BANDAGES/DRESSINGS) ×8 IMPLANT
ELECT BLADE 4.0 EZ CLEAN MEGAD (MISCELLANEOUS) ×4
ELECT CAUTERY BLADE 6.4 (BLADE) ×4 IMPLANT
ELECT COATED BLADE 2.86 ST (ELECTRODE) ×4 IMPLANT
ELECT REM PT RETURN 9FT ADLT (ELECTROSURGICAL) ×8
ELECTRODE BLDE 4.0 EZ CLN MEGD (MISCELLANEOUS) ×3 IMPLANT
ELECTRODE REM PT RTRN 9FT ADLT (ELECTROSURGICAL) ×6 IMPLANT
EVACUATOR SILICONE 100CC (DRAIN) ×8 IMPLANT
GAUZE SPONGE 4X4 12PLY STRL (GAUZE/BANDAGES/DRESSINGS) ×8 IMPLANT
GAUZE SPONGE 4X4 12PLY STRL LF (GAUZE/BANDAGES/DRESSINGS) ×1 IMPLANT
GAUZE XEROFORM 1X8 LF (GAUZE/BANDAGES/DRESSINGS) ×4 IMPLANT
GLOVE BIOGEL M STRL SZ7.5 (GLOVE) ×8 IMPLANT
GLOVE BIOGEL PI IND STRL 8 (GLOVE) ×3 IMPLANT
GLOVE BIOGEL PI INDICATOR 8 (GLOVE) ×1
GLOVE INDICATOR 8.0 STRL GRN (GLOVE) ×8 IMPLANT
GLOVE SRG 8 PF TXTR STRL LF DI (GLOVE) ×3 IMPLANT
GLOVE SURG ENC MOIS LTX SZ7.5 (GLOVE) ×4 IMPLANT
GLOVE SURG UNDER POLY LF SZ8 (GLOVE) ×4
GOWN STRL REUS W/ TWL LRG LVL3 (GOWN DISPOSABLE) ×12 IMPLANT
GOWN STRL REUS W/TWL 2XL LVL3 (GOWN DISPOSABLE) ×4 IMPLANT
GOWN STRL REUS W/TWL LRG LVL3 (GOWN DISPOSABLE) ×16
KIT BASIN OR (CUSTOM PROCEDURE TRAY) ×8 IMPLANT
KIT TURNOVER KIT B (KITS) ×4 IMPLANT
MESH SOFT 12X12IN BARD (Mesh General) ×1 IMPLANT
MESH VICRYL KNITTED 12X12 (Mesh General) ×1 IMPLANT
NDL HYPO 25GX1X1/2 BEV (NEEDLE) ×3 IMPLANT
NDL SPNL 18GX3.5 QUINCKE PK (NEEDLE) IMPLANT
NEEDLE HYPO 25GX1X1/2 BEV (NEEDLE) ×4 IMPLANT
NEEDLE SPNL 18GX3.5 QUINCKE PK (NEEDLE) ×8 IMPLANT
NS IRRIG 1000ML POUR BTL (IV SOLUTION) ×8 IMPLANT
PACK GENERAL/GYN (CUSTOM PROCEDURE TRAY) ×8 IMPLANT
PAD ABD 8X10 STRL (GAUZE/BANDAGES/DRESSINGS) ×16 IMPLANT
PAD ARMBOARD 7.5X6 YLW CONV (MISCELLANEOUS) ×4 IMPLANT
PENCIL SMOKE EVACUATOR (MISCELLANEOUS) ×8 IMPLANT
PIN SAFETY STERILE (MISCELLANEOUS) ×4 IMPLANT
SHEET MEDIUM DRAPE 40X70 STRL (DRAPES) IMPLANT
SLEEVE SCD COMPRESS KNEE MED (STOCKING) ×4 IMPLANT
SPONGE T-LAP 18X18 ~~LOC~~+RFID (SPONGE) ×8 IMPLANT
STAPLER INSORB 30 2030 C-SECTI (MISCELLANEOUS) ×11 IMPLANT
STAPLER VISISTAT 35W (STAPLE) IMPLANT
STRIP CLOSURE SKIN 1/2X4 (GAUZE/BANDAGES/DRESSINGS) ×4 IMPLANT
SUT ETHILON 2 0 PS N (SUTURE) IMPLANT
SUT ETHILON 3 0 PS 1 (SUTURE) ×10 IMPLANT
SUT MNCRL AB 3-0 PS2 27 (SUTURE) ×8 IMPLANT
SUT MNCRL AB 4-0 PS2 18 (SUTURE) ×4 IMPLANT
SUT MON AB 5-0 PS2 18 (SUTURE) IMPLANT
SUT PDS AB 0 CT 36 (SUTURE) IMPLANT
SUT PDS AB 2-0 CT2 27 (SUTURE) ×15 IMPLANT
SUT VIC AB 2-0 CT1 27 (SUTURE) ×24
SUT VIC AB 2-0 CT1 TAPERPNT 27 (SUTURE) ×12 IMPLANT
SUT VIC AB 2-0 CTX 27 (SUTURE) ×5 IMPLANT
SUT VIC AB 3-0 SH 8-18 (SUTURE) ×5 IMPLANT
SUT VLOC 90 3-0 CLR P12 (SUTURE) ×3 IMPLANT
SUT VLOC 90 P-14 23 (SUTURE) ×12 IMPLANT
SYR BULB IRRIG 60ML STRL (SYRINGE) ×4 IMPLANT
SYR CONTROL 10ML LL (SYRINGE) ×4 IMPLANT
TOWEL GREEN STERILE (TOWEL DISPOSABLE) ×4 IMPLANT
TOWEL GREEN STERILE FF (TOWEL DISPOSABLE) ×4 IMPLANT
TRAY FOLEY MTR SLVR 14FR STAT (SET/KITS/TRAYS/PACK) IMPLANT
TRAY FOLEY W/BAG SLVR 16FR (SET/KITS/TRAYS/PACK)
TRAY FOLEY W/BAG SLVR 16FR ST (SET/KITS/TRAYS/PACK) IMPLANT
UNDERPAD 30X36 HEAVY ABSORB (UNDERPADS AND DIAPERS) ×4 IMPLANT

## 2022-04-03 NOTE — Anesthesia Procedure Notes (Signed)
Procedure Name: Intubation ?Date/Time: 04/03/2022 10:00 AM ?Performed by: Claris Che, CRNA ?Pre-anesthesia Checklist: Patient identified, Emergency Drugs available, Suction available, Patient being monitored and Timeout performed ?Patient Re-evaluated:Patient Re-evaluated prior to induction ?Preoxygenation: Pre-oxygenation with 100% oxygen ?Induction Type: IV induction and Cricoid Pressure applied ?Ventilation: Mask ventilation without difficulty ?Laryngoscope Size: Mac and 4 ?Grade View: Grade II ?Tube type: Oral ?Tube size: 7.5 mm ?Number of attempts: 1 ?Airway Equipment and Method: Stylet ?Placement Confirmation: ETT inserted through vocal cords under direct vision, positive ETCO2 and breath sounds checked- equal and bilateral ?Secured at: 22 cm ?Tube secured with: Tape ?Dental Injury: Teeth and Oropharynx as per pre-operative assessment  ? ? ? ? ?

## 2022-04-03 NOTE — Progress Notes (Signed)
Pt states there is no way she could be pregnant and that she hasn't had a menstrual cycle in 15 years. Will confirm with [place] that a urine pregnancy test is not needed. ?

## 2022-04-03 NOTE — Plan of Care (Signed)

## 2022-04-03 NOTE — Op Note (Signed)
? ?Patient: Rachel [name] (03/06/1981, [id number]) ? ?Date of Surgery: 04/03/2022  ? ?Preoperative Diagnosis: RECURRENT INCISIONAL HERNIA  ? 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 ?Surgical Procedure: Panel 1 ?OPEN INCISIONAL HERNIA REPAIR, RECURRENT WITH MESH ?Bilateral posterior rectus myofascial release ? ?Panel 2 ?PANNICULECTOMY: 15830 (CPT?)  ? ?Operative Team Members:  ?Surgeon(s) and Role: ?Panel 1: ?   * Quentez Lober, Hyman Hopes, MD - Primary ?Panel 2: ?   * Arita Miss, Wendy Poet, MD - Primary  ? ?Anesthesiologist: Elmer Picker, MD ?CRNA: Melina Schools, CRNA; Lonia Mad, CRNA  ? ?Anesthesia: General  ? ?Fluids:  ?Total I/O ?In: 1000 [I.V.:1000] ?Out: 140 [Urine:115; Blood:25] ? ?Complications: None ? ?Drains:  None ? ?Specimen: None ? ?Disposition:  PACU - hemodynamically stable. ? ?Plan of Care: Admit for overnight observation ? ?Indications for Procedure:  ?Rachel [name] is a 41 y.o. female who is seen today as an office consultation at the request of Dr. Daphine Deutscher for evaluation of incisional hernia.  ? ?On 11/07/2013, Rachel [name] underwent open sigmoid colectomy with Dr. Lindie Spruce. She developed an incisional hernia from this lower midline incision. On 01/29/20, she underwent laparoscopic roux-en-y gastric bypass with reduction of the midline hernia and closure with #1 novafil suture. This hernia has recurred, she has lost weight and had remission of diabetes. She presents to be evaluated for hernia repair. I discussed the options for robotic or open incisional hernia repair with mesh. She is also interested in abdominoplasty. I referred her to Dr. Arita Miss who evaluated her and decided she was a candidate for abdominoplasty at the time of incisional hernia repair.  We discussed the procedures themselves as well as the risks, benefits, and alternatives. The risk discussed included but were not limited to the risk  of infection, bleeding, damage to nearby structures, mesh complication, recurrent hernia. After full discussion all questions answered the patient would like to proceed with scheduling surgery.  We will proceed as scheduled. ? ?Findings:  ?Hernia Location: Ventral hernia location: Suprapubic (M5) ?Hernia Size:  15 cm tall x 10 cm wide  ?Mesh Size &Type:  25cm tall x 20 cm wide Bard Soft Mesh in the retromuscular position.  Vicryl mesh patch used to close posterior layer ?Mesh Position: Sublay - Retromuscular ?Myofascial Releases: Bilateral Myofascial Release: Posterior rectus myofascial release ? ? ?Description of Procedure: ? ?The patient was positioned supine on the operating room table, adequately padded and secured.  A timeout procedure was performed. ? ?A midline incision was made and dissection was carried down through subcutaneous tissue. The prior scar was excised, completely. The abdomen was entered safely.  There were dense adhesions around the hernia and omentum stuck in a small umbilical defect.  A meticulous and tedious sharp lysis of adhesions was carried out.  This was completed with no injury to the viscera. ? ?The recurrent incisional hernia contained a piece of small intestine. There was clear transition from dilated small bowel prior to the hernia to decompressed small bowel after the hernia.  The entire lumen of the small bowel was not contained in the hernia consistent with richter's pathology.  The herniated wall of the small intestine was carefully dissected out of the hernia sac.  The portion of the wall of the intestine that was herniated appeared friable so it was imbricated with 2-0 vicryl sutures being careful not to narrow the intestine. ? ?After the anterior abdominal wall  was cleared off of all adhesions, a large safety towel was placed over the viscera to protect them.  The hernia defect was located in the suprapubic region. The total hernia defect area was measured and the size was  recorded above under findings. ? ?A rectus myofascial release was performed on the LEFT side. The posterior rectus sheath was incised just lateral to the hernia edge.  This incision in the posterior rectus sheath was extended both cephalad and caudal to the hernia defect.  Dissection was carried out laterally in the retromuscular plane to the edge of the rectus sheath progressively disconnecting the rectus muscle from the underlying posterior rectus sheath. The segmental innervation comprised of intercostal nerve branches 8, 9, 10, 11 and 12 were individually identified and preserved.  The arterial blood supply to the rectus muscle comprised of the superior and inferior epigastric arteries along with the small terminal branches from the posterior intercostal arteries 10, 11 and 12, the subcostal artery, the posterior lumbar arteries, and the deep circumflex artery were all identified and individually preserved.  The rectus myofascial release accomplished medialization of the posterior rectus sheath towards the midline and disinsertion of the rectus muscle from its surrounding fascia, and thus its encasement in the rectus sheath, allowing for widening of the rectus muscle and transfer of the rectus flap towards the midline.  This will allow for future inset of the medial aspect of the flap for abdominal wall reconstruction. ? ?A rectus myofascial release was performed on the RIGHT side. The posterior rectus sheath was incised just lateral to the hernia edge.  This incision in the posterior rectus sheath was extended both cephalad and caudal to the hernia defect.  Dissection was carried out laterally in the retromuscular plane to the edge of the rectus sheath progressively disconnecting the rectus muscle from the underlying posterior rectus sheath. The segmental innervation comprised of intercostal nerve branches 8, 9, 10, 11 and 12 were individually identified and preserved.  The arterial blood supply to the rectus  muscle comprised of the superior and inferior epigastric arteries along with the small terminal branches from the posterior intercostal arteries 10, 11 and 12, the subcostal artery, the posterior lumbar arteries, and the deep circumflex artery were all identified and individually preserved.  The rectus myofascial release accomplished medialization of the posterior rectus sheath towards the midline and disinsertion of the rectus muscle from its surrounding fascia, and thus its encasement in the rectus sheath, allowing for widening of the rectus muscle and transfer of the rectus flap towards the midline.  This will allow for future inset of the medial aspect of the flap for abdominal wall reconstruction. ? ? ?The towel was removed and the posterior fascia was patched in the midline with a piece of vicryl mesh, using 2-0 vicryl suture to sew it in circumferentially. The retromuscular plane was copiously irrigated with warm normal saline. There was good hemostasis of the operative field.  ? ?The mesh was deployed into the retromuscular position where it conformed well to the space.  It did not require any fixation. ? ?The mesh space was irrigated with saline.  The rectus muscles were re approximated in the midline utilizing a continuous 2-0 PDS suture taking 5 mm bites of the fascia with 5 mm advancement. The fascia came together well. At this point the case was turned over to Dr. Arita Miss for the panniculectomy. ? ? ?Ivar Drape, MD ?General, Bariatric, & Minimally Invasive Surgery ?[facility], [place]  ?

## 2022-04-03 NOTE — Discharge Instructions (Addendum)
Avoid strenuous activity.  Recommend walking at least a few steps every hour while awake to reduce the chance of blood clots. ? ?Diet: Regular ? ?Wound Care: Keep dressing clean & dry.  Replace gauze as needed.  Leave Steri-Strips in place.  You can shower in 2 days.  Wear abdominal binder as much as possible. ? ?Special Instructions: ?Continue to empty, recharge, & record drainage from drains 2-3 times a day, or as needed. ? ?Call Dr. Keane Scrape office if any unusual problems occur such as pain, excessive ?bleeding, unrelieved nausea/vomiting, fever &/or chills. ? ?Follow-up appointment: Scheduled for next week. ? ? ? ?VENTRAL HERNIA REPAIR POST OPERATIVE INSTRUCTIONS ? ?Thinking Clearly  ?The anesthesia may cause you to feel different for 1 or 2 days. Do not drive, drink alcohol, or make any big decisions for at least 2 days. ? ?Nutrition ?When you wake up, you will be able to drink small amounts of liquid. If you do not feel sick, you can slowly advance your diet to regular foods. ?Continue to drink lots of fluids, usually about 8 to 10 glasses per day. ?Eat a high-fiber diet so you don?t strain during bowel movements. ?High-Fiber Foods ?Foods high in fiber include beans, bran cereals and whole-grain breads, peas, dried fruit (figs, apricots, and dates), raspberries, blackberries, strawberries, sweet corn, broccoli, baked potatoes with skin, plums, pears, apples, greens, and nuts. ?Activity ?Slowly increase your activity. Be sure to get up and walk every hour or so to prevent blood clots. ?No heavy lifting or strenuous activity for 4 weeks following surgery to prevent hernias at your incision sites or recurrence of your hernia. ?It is normal to feel tired. You may need more sleep than usual.  Get your rest but make sure to get up and move around frequently to prevent blood clots and pneumonia. ? ?Work and Return to [employer] ?You can go back to work when you feel well enough. Discuss the timing with your surgeon. ?You  can usually go back to school or work 1 week or less after an laparoscopic or an open repair. ?If your work requires heavy lifting or strenuous activity you need to be placed on light duty for 4 weeks following surgery. ?You can return to gym class, sports or other physical activities 4 weeks after surgery. ? ?Wound Care ?You may experience significant bruising throughout the abdominal wall that may track down into the groin including into the scrotum in males.  Rest, elevating the groin and scrotum above the level of the heart, ice and compression with tight fitting underwear or an abdominal binder can help.  ?Always wash your hands before and after touching near your incision site. ?Do not soak in a bathtub until cleared at your follow up appointment. You may take a shower 24 hours after surgery. ?A small amount of drainage from the incision is normal. If the drainage is thick and yellow or the site is red, you may have an infection, so call your surgeon. ?If you have a drain in one of your incisions, it will be taken out in office when the drainage stops. ?Steri-Strips will fall off in 7 to 10 days or they will be removed during your first office visit. ?If you have dermabond glue covering over the incision, allow the glue to flake off on its own. ?Protect the new skin, especially from the sun. The sun can burn and cause darker scarring. ?Your scar will heal in about 4 to 6 weeks and will become softer and  continue to fade over the next year.  The cosmetic appearance of the incisions will improve over the course of the first year after surgery. ?Sensation around your incision will return in a few weeks or months. ? ?Bowel Movements ?After intestinal surgery, you may have loose watery stools for several days. If watery diarrhea lasts longer than 3 days, contact your surgeon. ?Pain medication (narcotics) can cause constipation. Increase the fiber in your diet with high-fiber foods if you are constipated. You can  take an over the counter stool softener like Colace to avoid constipation.  Additional over the counter medications can also be used if Colace isn't sufficient (for example, Milk of Magnesia or Miralax). ? ?Pain ?The amount of pain is different for each person. Some people need only 1 to 3 doses of pain control medication, while others need more. ?Take alternating doses of tylenol and ibuprofen around the clock for the first five days following surgery.  This will provide a baseline of pain control and help with inflammation.  Take the narcotic pain medication in addition if needed for severe pain. ? ?Geneticist, molecular at [phone], if you have: ?Pain that will not go away ?Pain that gets worse ?A fever of more than 101?F (38.3?C) ?Repeated vomiting ?Swelling, redness, bleeding, or bad-smelling drainage from your wound site ?Strong abdominal pain ?No bowel movement or unable to pass gas for 3 days ?Watery diarrhea lasting longer than 3 days ? ?Pain Control ?The goal of pain control is to minimize pain, keep you moving and help you heal. Your surgical team will work with you on your pain plan. Most often a combination of therapies and medications are used to control your pain. You may also be given medication (local anesthetic) at the surgical site. This may help control your pain for several days. ?Extreme pain puts extra stress on your body at a time when your body needs to focus on healing. Do not wait until your pain has reached a level ?10? or is unbearable before telling your doctor or nurse. It is much easier to control pain before it becomes severe. ?Following a laparoscopic procedure, pain is sometimes felt in the shoulder. This is due to the gas inserted into your abdomen during the procedure. Moving and walking helps to decrease the gas and the right shoulder pain. ? ?Use the guide below for ways to manage your post-operative pain. Learn more by going to facs.org/safepaincontrol. ? ?How Intense Is My  Pain Common Therapies to Feel Better  ? ? ? ? ? ?I hardly notice my pain, and it does not interfere with my activities. ? ?I notice my pain and it distracts me, but I can still do activities (sitting up, walking, standing).  ?Non-Medication Therapies ? ?Ice (in a bag, applied over clothing at the surgical site), elevation, rest, meditation, massage, distraction (music, TV, play) walking and mild exercise ?Splinting the abdomen with pillows ?+ ? ?Non-Opioid Medications ?Acetaminophen (Tylenol?) ?Non-steroidal anti-inflammatory drugs (NSAIDS) Aspirin, Ibuprofen (Motrin?, Advil?) Naproxen (Aleve?) ?Take these as needed, when you feel pain. Both acetaminophen and NSAIDs help to decrease pain and swelling (inflammation). ?  ? ? ? ?My pain is hard to ignore and is more noticeable even when I rest. ? ?My pain interferes with my usual activities.  ?Non-Medication Therapies ? ?+ ? ?Non-Opioid medications  ?Take on a regular schedule (around-the-clock) instead of as needed. (For example, Tylenol? every 6 hours at 9:00 am, 3:00 pm, 9:00 pm, 3:00 am and Motrin? every 6 hours  at 12:00 am, 6:00 am, 12:00 pm, 6:00 pm) ?  ? ? ? ? ? ? ?I am focused on my pain, and I am not doing my daily activities. ? ?I am groaning in pain, and I cannot sleep. I am unable to do anything. ? ?My pain is as bad as it could be, and nothing else matters.  ?Non-Medication Therapies ? ?+ ? ?Around-the-Clock Non-Opioid Medications ? ?+ ? ?Short-acting opioids ? ?Opioids should be used with other medications to manage severe pain. Opioids block pain and give a feeling of euphoria (feel high). ?Addiction, a serious side effect of opioids, is rare with short-term (a few days) use. ? ?Examples of short-acting opioids include: Tramadol (Ultram?), Hydrocodone (Norco?, Vicodin?), Hydromorphone (Dilaudid?), Oxycodone (Oxycontin?) ?  ? ? ?The above directions have been adapted from the [employer] Surgical Patient Education Program.  Please refer  to the ACS website if needed: See[place].[email or web address].ashx ? ? ?Louanna Raw, MD ?[facility], [place] ?[address], [place], [place]

## 2022-04-03 NOTE — H&P (Signed)
? ?Admitting Physician: Nickola Major Aubrina Nieman ? ?Service: General surgery ? ?CC: Hernia ? ?Subjective  ? ?HPI: ?Rachel [name] is an 41 y.o. female who is here for hernia repair at time of abdominoplasty. ? ? ?On 11/07/2013, Ms. Rachel [name] underwent open sigmoid colectomy with Dr. Hulen Skains. She developed an incisional hernia from this lower midline incision. On 01/29/20, she underwent laparoscopic roux-en-y gastric bypass with reduction of the midline hernia and closure with #1 novafil suture. This hernia has recurred, she has lost weight and had remission of diabetes. She presents to be evaluated for hernia repair. She has a lower midline incisional hernia that gets hard and painful and causes constipation. She wants it fixed. ? ?Past Medical History:  ?Diagnosis Date  ? Asthma   ? Bipolar disorder ([place])   ? Depression   ? Diabetes mellitus without complication ([place])   ? This was no longer an issue after gastric bypass, per pt- not diabetic  ? Diverticulosis   ? Epilepsy ([place])   ? 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 last seizure Sunday 01/24/17.anxiety precipitates seizure  ? Migraines   ? "haven't had one since I was a kid" (06/05/2013)  ? Sleep apnea   ? CPAP  ? ? ?Past Surgical History:  ?Procedure Laterality Date  ? CARPAL TUNNEL RELEASE Left 2011?  ? CARPAL TUNNEL RELEASE Right 01/27/2017  ? Procedure: CARPAL TUNNEL RELEASE;  Surgeon: Leanor Kail, MD;  Location: [facility] ORS;  Service: Orthopedics;  Laterality: Right;  ? COLON SURGERY  2014  ? bowel resection/colostomy  ? COLOSTOMY REVISION N/A 11/07/2013  ? Procedure: COLON RESECTION SIGMOID;  Surgeon: Gwenyth Ober, MD;  Location: [place];  Service: General;  Laterality: N/A;  ? GASTRIC ROUX-EN-Y N/A 01/29/2020  ? Procedure: LAPAROSCOPIC ROUX-EN-Y GASTRIC BYPASS WITH UPPER ENDOSCOPY, REDUCTION of Incarcerated Hernia, Laproscopic Open Repair of Large Lower 10 cm hernia;  Surgeon: Johnathan Hausen, MD;  Location: [facility];  Service: General;  Laterality: N/A;  ? ? ?Family History   ?Problem Relation Age of Onset  ? Cancer Maternal Grandfather   ?     prostate  ? Cancer Maternal Aunt   ?     breast  ? Breast cancer Maternal Aunt   ? Breast cancer Paternal Aunt   ? ? ?Social:  reports that she quit smoking about 4 months ago. Her smoking use included cigarettes. She has a 18.00 pack-year smoking history. She has never used smokeless tobacco. She reports that she does not drink alcohol and does not use drugs. ? ?Allergies:  ?Allergies  ?Allergen Reactions  ? Hand Sanitizer [Ethyl Alcohol (Skin Cleanser)] Hives  ? Codeine   ?  HALLUCINATIONS  ? Morphine And Related Itching  ? Phenergan [Promethazine Hcl] Nausea And Vomiting  ? ? ?Medications: ?Current Outpatient Medications  ?Medication Instructions  ? acetaminophen (TYLENOL) 500 mg, Oral, Every 4 hours PRN  ? atorvastatin (LIPITOR) 40 mg, Oral, Daily at bedtime  ? CALCIUM PO 650 mg, Oral, Daily  ? cetirizine (ZYRTEC) 10 mg, Oral, Daily at bedtime  ? citalopram (CELEXA) 40 mg, Oral, Daily  ? Fluticasone-Salmeterol (ADVAIR) 250-50 MCG/DOSE AEPB 1 puff, Inhalation, 2 times daily  ? gabapentin (NEURONTIN) 300 mg, Oral, Daily at bedtime  ? lamoTRIgine (LAMICTAL) 200 mg, 3 times daily  ? levETIRAcetam (KEPPRA) 500 mg, Daily at bedtime  ? losartan (COZAAR) 25 mg, Oral, Daily at bedtime  ? Multiple Vitamins-Minerals (BARIATRIC MULTIVITAMINS/IRON PO) 1 tablet, Oral, Daily  ? ondansetron (ZOFRAN-ODT) 4 mg, Oral, Every 8  hours PRN  ? PROAIR HFA 108 (90 Base) MCG/ACT inhaler 2 puffs, Inhalation, Every 6 hours PRN  ? VITAMIN A PO 6,000 Units, Oral, Daily  ? vitamin B-12 (CYANOCOBALAMIN) 1,000 mcg, Oral, 2 times daily  ? vitamin C (ASCORBIC ACID) 500 mg, Oral, Daily  ? Vitamin D 2,000 Units, Oral, Daily  ? ? ?ROS - all of the below systems have been reviewed with the patient and positives are indicated with bold text ?General: chills, fever or night sweats ?Eyes: blurry vision or double vision ?ENT: epistaxis or sore throat ?Allergy/Immunology: itchy/watery  eyes or nasal congestion ?Hematologic/Lymphatic: bleeding problems, blood clots or swollen lymph nodes ?Endocrine: temperature intolerance or unexpected weight changes ?Breast: new or changing breast lumps or nipple discharge ?Resp: cough, shortness of breath, or wheezing ?CV: chest pain or dyspnea on exertion ?GI: as per HPI ?GU: dysuria, trouble voiding, or hematuria ?MSK: joint pain or joint stiffness ?Neuro: TIA or stroke symptoms ?Derm: pruritus and skin lesion changes ?Psych: anxiety and depression ? ?Objective  ? ?PE ?Blood pressure 106/67, pulse 76, temperature 97.7 ?F (36.5 ?C), temperature source Oral, resp. rate 18, height 5\' 5"  (1.651 m), weight 90.7 kg, SpO2 94 %. ?Constitutional: NAD; conversant; no deformities ?Eyes: Moist conjunctiva; no lid lag; anicteric; PERRL ?Neck: Trachea midline; no thyromegaly ?Lungs: Normal respiratory effort; no tactile fremitus ?CV: RRR; no palpable thrills; no pitting edema ?GI: Abd Lower midline incision with palpable hernia bulging to the right of midline ?MSK: Normal range of motion of extremities; no clubbing/cyanosis ?Psychiatric: Appropriate affect; alert and oriented x3 ?Lymphatic: No palpable cervical or axillary lymphadenopathy ? ?No results found for this or any previous visit (from the past 24 hour(s)). ? ?Imaging Orders  ?No imaging studies ordered today  ? ? ? ?Assessment and Plan  ? ?Rachel [name] is a 41 y.o. female who is seen today as an office consultation at the request of Dr. Hassell Done for evaluation of incisional hernia.  ? ?On 11/07/2013, Ms. Rachel [name] underwent open sigmoid colectomy with Dr. Hulen Skains. She developed an incisional hernia from this lower midline incision. On 01/29/20, she underwent laparoscopic roux-en-y gastric bypass with reduction of the midline hernia and closure with #1 novafil suture. This hernia has recurred, she has lost weight and had remission of diabetes. She presents to be evaluated for hernia repair. I discussed the options for  robotic or open incisional hernia repair with mesh. She is also interested in abdominoplasty. I referred her to Dr. Claudia Desanctis who evaluated her and decided she was a candidate for abdominoplasty at the time of incisional hernia repair.  We discussed the procedures themselves as well as the risks, benefits, and alternatives. The risk discussed included but were not limited to the risk of infection, bleeding, damage to nearby structures, mesh complication, recurrent hernia. After full discussion all questions answered the patient would like to proceed with scheduling surgery.  We will proceed as scheduled. ? ? ? ?Felicie Morn, MD ? ?[facility] ?Use [email or web address] to contact on call provider ? ? ? ?

## 2022-04-03 NOTE — Transfer of Care (Signed)
Immediate Anesthesia Transfer of Care Note ? ?Patient: Rachel [name] ? ?Procedure(s) Performed: OPEN INCISIONAL HERNIA REPAIR 15 x10 cm, RECURRENT WITH MESH AND BILATERAL POSTERIOR RECTUS MYOFASCIAL RELEASE (Bilateral) ?PANNICULECTOMY ? ?Patient Location: PACU ? ?Anesthesia Type:General ? ?Level of Consciousness: drowsy, patient cooperative and responds to stimulation ? ?Airway & Oxygen Therapy: Patient Spontanous Breathing, Patient connected to nasal cannula oxygen and Patient connected to face mask oxygen ? ?Post-op Assessment: Report given to RN, Post -op Vital signs reviewed and stable and Patient moving all extremities X 4 ? ?Post vital signs: Reviewed and stable ? ?Last Vitals:  ?Vitals Value Taken Time  ?BP 114/47 04/03/22 1405  ?Temp    ?Pulse 98 04/03/22 1412  ?Resp 28 04/03/22 1412  ?SpO2 89 % 04/03/22 1412  ?Vitals shown include unvalidated device data. ? ?Last Pain:  ?Vitals:  ? 04/03/22 0845  ?TempSrc:   ?PainSc: 0-No pain  ?   ? ?  ? ?Complications: No notable events documented. ?

## 2022-04-03 NOTE — Op Note (Signed)
Operative Note   DATE OF OPERATION: 04/03/2022  SURGICAL DEPARTMENT: Plastic Surgery  PREOPERATIVE DIAGNOSES:  Panniculitis  POSTOPERATIVE DIAGNOSES:  same  PROCEDURE:  Infraumbilical Panniculectomy  SURGEON: Ancil Linsey, MD  ASSISTANT: Evelena Leyden, PA The advanced practice practitioner (APP) assisted throughout the case.  The APP was essential in retraction and counter traction when needed to make the case progress smoothly.  This retraction and assistance made it possible to see the tissue plans for the procedure.  The assistance was needed for blood control, tissue re-approximation and assisted with closure of the incision site.  ANESTHESIA:  General.   COMPLICATIONS: None.   INDICATIONS FOR PROCEDURE:  The patient, Rachel [name] is a 41 y.o. female born on 06/09/1981, is here for treatment of panniculitis MRN: [id number]  CONSENT:  Informed consent was obtained directly from the patient. Risks, benefits and alternatives were fully discussed. Specific risks including but not limited to bleeding, infection, hematoma, seroma, scarring, pain, contracture, asymmetry, wound healing problems, and need for further surgery were all discussed. The patient did have an ample opportunity to have questions answered to satisfaction.   DESCRIPTION OF PROCEDURE:  The patient was taken to the operating room. SCDs were placed and antibiotics were given. General anesthesia was administered.  The patient's operative site was prepped and draped in a sterile fashion. A time out was performed and all information was confirmed to be correct.  Dr. Dossie Der performed his hernia repair which will be dictated separately.  Patient was then turned over to me.  I started by marking the midline from the vulvar commissure to the umbilicus.  I then planned out the inferior incision just below the infra pannus crease.  I then estimated the superior incision and the amount of excision.  The area was then  infiltrated with tumescent solution.  I started by making the inferior incision with a 10 blade.  I dissected down to the fascia with cautery.  I then undermined superiorly to approximately the level of the umbilicus.  I used tailor tacking to estimate the accuracy of the superior incision.  Superior incision was then made with a 10 blade and the specimen was removed on the left and then the right side.  Total weight of the specimen was >2700g.  Hemostasis was meticulously obtained.  Two 73 French JP drains were placed and secured with a nylon suture.  Closure was done with buried 2-0 Vicryl sutures for Scarpa's layer.  The skin was closed with a combination of in sorb staples and a running 3-0 V lock suture.  Steri-Strips and a soft dressing were then applied as well as an abdominal binder.  The patient tolerated the procedure well.  There were no complications. The patient was allowed to wake from anesthesia, extubated and taken to the recovery room in satisfactory condition.

## 2022-04-03 NOTE — Interval H&P Note (Signed)
Patient seen and examined. Risks and benefits discussed. Proceed with surgery.

## 2022-04-04 DIAGNOSIS — K430 Incisional hernia with obstruction, without gangrene: Secondary | ICD-10-CM | POA: Diagnosis not present

## 2022-04-04 DIAGNOSIS — Z933 Colostomy status: Secondary | ICD-10-CM | POA: Diagnosis not present

## 2022-04-04 DIAGNOSIS — Z9884 Bariatric surgery status: Secondary | ICD-10-CM | POA: Diagnosis not present

## 2022-04-04 DIAGNOSIS — M793 Panniculitis, unspecified: Secondary | ICD-10-CM | POA: Diagnosis not present

## 2022-04-04 DIAGNOSIS — Z79899 Other long term (current) drug therapy: Secondary | ICD-10-CM | POA: Diagnosis not present

## 2022-04-04 DIAGNOSIS — Z87891 Personal history of nicotine dependence: Secondary | ICD-10-CM | POA: Diagnosis not present

## 2022-04-04 DIAGNOSIS — J45909 Unspecified asthma, uncomplicated: Secondary | ICD-10-CM | POA: Diagnosis not present

## 2022-04-04 DIAGNOSIS — E119 Type 2 diabetes mellitus without complications: Secondary | ICD-10-CM | POA: Diagnosis not present

## 2022-04-04 LAB — BASIC METABOLIC PANEL
Anion gap: 8 (ref 5–15)
BUN: <5 mg/dL — ABNORMAL LOW (ref 6–20)
CO2: 26 mmol/L (ref 22–32)
Calcium: 8.7 mg/dL — ABNORMAL LOW (ref 8.9–10.3)
Chloride: 104 mmol/L (ref 98–111)
Creatinine, Ser: 0.63 mg/dL (ref 0.44–1.00)
GFR, Estimated: >60 mL/min (ref >60)
Glucose, Bld: 109 mg/dL — ABNORMAL HIGH (ref 70–99)
Potassium: 4.2 mmol/L (ref 3.5–5.1)
Sodium: 138 mmol/L (ref 135–145)

## 2022-04-04 LAB — CBC
HCT: 37.3 % (ref 36.0–46.0)
Hemoglobin: 11.9 g/dL — ABNORMAL LOW (ref 12.0–15.0)
MCH: 27.6 pg (ref 26.0–34.0)
MCHC: 31.9 g/dL (ref 30.0–36.0)
MCV: 86.5 fL (ref 80.0–100.0)
Platelets: 321 10*3/uL (ref 150–400)
RBC: 4.31 MIL/uL (ref 3.87–5.11)
RDW: 13.5 % (ref 11.5–15.5)
WBC: 17.6 10*3/uL — ABNORMAL HIGH (ref 4.0–10.5)
nRBC: 0 % (ref 0.0–0.2)

## 2022-04-04 NOTE — Progress Notes (Signed)
Pt refusing PIV insertion and states that "her doctor said she can't get IVs only central lines." ?

## 2022-04-04 NOTE — Progress Notes (Signed)
Patient is able to self administer CPAP. Patient is familiar with equipment and procedure and machine is within her reach.  FFM, 1 LPM 02 bleed in.  Patient advised to contact RT with any concerns.  ?

## 2022-04-04 NOTE — Progress Notes (Signed)
?  Transition of Care (TOC) Screening Note ? ? ?Patient Details  ?Name: Rachel [name] ?Date of Birth: 1981-10-30 ? ? ?Transition of Care (TOC) CM/SW Contact:    ?Bess Kinds, RN ?Phone Number: [phone] ?04/04/2022, 1:38 PM ? ? ? ?Transition of Care Department [facility]) has reviewed patient and no TOC needs have been identified at this time. We will continue to monitor patient advancement through interdisciplinary progression rounds. If new patient transition needs arise, please place a TOC consult. ?  ?

## 2022-04-04 NOTE — Progress Notes (Signed)
1 Day Post-Op  ? ?Subjective/Chief Complaint: ?Pt doing well ?No amb yet ?Tol clears ?+ flatus ? ? ?Objective: ?Vital signs in last 24 hours: ?Temp:  [97.7 ?F (36.5 ?C)-98.5 ?F (36.9 ?C)] 98.1 ?F (36.7 ?C) (04/22 0507) ?Pulse Rate:  [63-107] 63 (04/22 0507) ?Resp:  [16-34] 18 (04/22 0507) ?BP: (100-125)/(47-68) 100/60 (04/22 0507) ?SpO2:  [81 %-98 %] 98 % (04/22 0507) ?Weight:  [90.7 kg] 90.7 kg (04/21 0802) ?Last BM Date :  (pta) ? ?Intake/Output from previous day: ?04/21 0701 - 04/22 0700 ?In: 3760.5 [P.O.:480; I.V.:3030.5; IV Piggyback:250] ?Out: 920 [Urine:825; Drains:70; Blood:25] ?Intake/Output this shift: ?No intake/output data recorded. ? ?General appearance: alert and cooperative ?GI: soft, non-tender; bowel sounds normal; no masses,  no organomegaly and inc c/d/I, JP SS ? ?Lab Results:  ?Recent Labs  ?  04/04/22 ?0106  ?WBC 17.6*  ?HGB 11.9*  ?HCT 37.3  ?PLT 321  ? ?BMET ?Recent Labs  ?  04/03/22 ?1648 04/04/22 ?0106  ?NA  --  138  ?K  --  4.2  ?CL  --  104  ?CO2  --  26  ?GLUCOSE  --  109*  ?BUN  --  <5*  ?CREATININE 0.71 0.63  ?CALCIUM  --  8.7*  ? ?Anti-infectives: ?Anti-infectives (From admission, onward)  ? ? Start     Dose/Rate Route Frequency Ordered Stop  ? 04/03/22 0900  ceFAZolin (ANCEF) IVPB 2g/100 mL premix       ? 2 g ?200 mL/hr over 30 Minutes Intravenous On call to O.R. 04/03/22 0857 04/03/22 1012  ? ?  ? ? ?Assessment/Plan: ?s/p Procedure(s): ?OPEN RECURRENT INCISIONAL HERNIA REPAIR WITH AND BILATERAL POSTERIOR RECTUS MYOFASCIAL RELEASE (Bilateral) ?PANNICULECTOMY (N/A) ?INSERTION OF MESH (N/A) ?Advance diet as tol ?Await bowel fxn ?OK to ambulate in hall ?OK to leave IV out and take PO for hydration. ?Hopefully home Sunday ? LOS: 0 days  ? ? ?Ralene Ok ?04/04/2022 ? ?

## 2022-04-04 NOTE — Care Management Obs Status (Signed)
MEDICARE OBSERVATION STATUS NOTIFICATION ? ? ?Patient Details  ?Name: Rachel [name] ?MRN: [id number] ?Date of Birth: 04/14/1981 ? ? ?Medicare Observation Status Notification Given:  Yes ? ? ? ?Bess Kinds, RN ?04/04/2022, 1:37 PM ?

## 2022-04-05 DIAGNOSIS — J45909 Unspecified asthma, uncomplicated: Secondary | ICD-10-CM | POA: Diagnosis not present

## 2022-04-05 DIAGNOSIS — Z87891 Personal history of nicotine dependence: Secondary | ICD-10-CM | POA: Diagnosis not present

## 2022-04-05 DIAGNOSIS — M793 Panniculitis, unspecified: Secondary | ICD-10-CM | POA: Diagnosis not present

## 2022-04-05 DIAGNOSIS — Z79899 Other long term (current) drug therapy: Secondary | ICD-10-CM | POA: Diagnosis not present

## 2022-04-05 DIAGNOSIS — Z9884 Bariatric surgery status: Secondary | ICD-10-CM | POA: Diagnosis not present

## 2022-04-05 DIAGNOSIS — E119 Type 2 diabetes mellitus without complications: Secondary | ICD-10-CM | POA: Diagnosis not present

## 2022-04-05 DIAGNOSIS — Z933 Colostomy status: Secondary | ICD-10-CM | POA: Diagnosis not present

## 2022-04-05 DIAGNOSIS — K430 Incisional hernia with obstruction, without gangrene: Secondary | ICD-10-CM | POA: Diagnosis not present

## 2022-04-05 LAB — BASIC METABOLIC PANEL
Anion gap: 5 (ref 5–15)
BUN: 5 mg/dL — ABNORMAL LOW (ref 6–20)
CO2: 28 mmol/L (ref 22–32)
Calcium: 8.5 mg/dL — ABNORMAL LOW (ref 8.9–10.3)
Chloride: 107 mmol/L (ref 98–111)
Creatinine, Ser: 0.51 mg/dL (ref 0.44–1.00)
GFR, Estimated: >60 mL/min (ref >60)
Glucose, Bld: 96 mg/dL (ref 70–99)
Potassium: 3.7 mmol/L (ref 3.5–5.1)
Sodium: 140 mmol/L (ref 135–145)

## 2022-04-05 LAB — CBC
HCT: 35.5 % — ABNORMAL LOW (ref 36.0–46.0)
Hemoglobin: 11.5 g/dL — ABNORMAL LOW (ref 12.0–15.0)
MCH: 28 pg (ref 26.0–34.0)
MCHC: 32.4 g/dL (ref 30.0–36.0)
MCV: 86.6 fL (ref 80.0–100.0)
Platelets: 305 10*3/uL (ref 150–400)
RBC: 4.1 MIL/uL (ref 3.87–5.11)
RDW: 13.8 % (ref 11.5–15.5)
WBC: 11 10*3/uL — ABNORMAL HIGH (ref 4.0–10.5)
nRBC: 0 % (ref 0.0–0.2)

## 2022-04-05 MED ORDER — GUAIFENESIN 200 MG PO TABS
200.0000 mg | ORAL_TABLET | Freq: Four times a day (QID) | ORAL | Status: DC | PRN
  Administered 2022-04-05 (×2): 200 mg via ORAL
  Filled 2022-04-05 (×2): qty 1

## 2022-04-05 MED ORDER — GUAIFENESIN 200 MG PO TABS
200.0000 mg | ORAL_TABLET | ORAL | Status: DC | PRN
  Filled 2022-04-05: qty 1

## 2022-04-05 MED ORDER — BENZONATATE 100 MG PO CAPS
100.0000 mg | ORAL_CAPSULE | Freq: Three times a day (TID) | ORAL | Status: DC | PRN
  Administered 2022-04-05: 100 mg via ORAL
  Filled 2022-04-05: qty 1

## 2022-04-05 MED ORDER — OXYCODONE-ACETAMINOPHEN 5-325 MG PO TABS: 1.0000 | ORAL_TABLET | ORAL | 0 refills | Status: AC | PRN

## 2022-04-05 NOTE — Discharge Summary (Signed)
?Patient ID: ?Rachel [name] ?[id number] ?41 y.o. ?September 20, 1981 ? ?04/03/2022 ? ?Discharge date and time: 04/05/2022 ? ?Admitting Physician: Hyman Hopes Carthel Castille ? ?Discharge Physician: Hyman Hopes Katasha Riga ? ?Admission Diagnoses: Incisional hernia [K43.2] ?Patient Active Problem List  ? Diagnosis Date Noted  ? Incisional hernia 04/03/2022  ? Erythrocytosis 10/11/2020  ? 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 Postop check 12/12/2013  ? Diverticulitis 10/31/2013  ? Tobacco use 08/08/2013  ? H/O diverticulitis of colon 08/08/2013  ? Seizure ([facility]) 08/08/2013  ? Tinea corporis 08/08/2013  ? Screening for malignant neoplasm of the cervix 08/08/2013  ? ? ? ?Discharge Diagnoses: Incisional hernia ?Patient Active Problem List  ? Diagnosis Date Noted  ? Incisional hernia 04/03/2022  ? Erythrocytosis 10/11/2020  ? 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 Postop check 12/12/2013  ? Diverticulitis 10/31/2013  ? Tobacco use 08/08/2013  ? H/O diverticulitis of colon 08/08/2013  ? Seizure ([facility]) 08/08/2013  ? Tinea corporis 08/08/2013  ? Screening for malignant neoplasm of the cervix 08/08/2013  ? ? ?Operations: Procedure(s): ?OPEN RECURRENT INCISIONAL HERNIA REPAIR WITH AND BILATERAL POSTERIOR RECTUS MYOFASCIAL RELEASE ?PANNICULECTOMY ?INSERTION OF MESH ? ?Admission Condition: good ? ?Discharged Condition: good ? ?Indication for Admission: Incisional hernia ? ? ?Hospital Course:  Rachel [name] underwent open incisional hernia repair with repair of incarcerated small bowel with Dr. Dossie Der and abdominoplasty with Dr. Arita Miss on 04/03/22 ? ?Consults: None ? ?Significant Diagnostic Studies: None ? ?Treatments: surgery: as above ? ?Disposition: Home ? ?Patient Instructions:  ?Allergies as of 04/05/2022   ? ?   Reactions  ? Hand Sanitizer [ethyl Alcohol (skin Cleanser)] Hives  ? Codeine   ? HALLUCINATIONS  ? Morphine And Related Itching  ? Phenergan [promethazine Hcl] Nausea And Vomiting  ? ?  ? ?  ?Medication List  ?  ? ?TAKE  these medications   ? ?acetaminophen 160 MG/5ML liquid ?Commonly known as: TYLENOL ?Take 500 mg by mouth every 4 (four) hours as needed for pain. ?  ?atorvastatin 40 MG tablet ?Commonly known as: LIPITOR ?Take 40 mg by mouth at bedtime. ?  ?BARIATRIC MULTIVITAMINS/IRON PO ?Take 1 tablet by mouth daily. ?  ?CALCIUM PO ?Take 650 mg by mouth daily. ?  ?cetirizine 10 MG tablet ?Commonly known as: ZYRTEC ?Take 10 mg by mouth at bedtime. ?  ?citalopram 40 MG tablet ?Commonly known as: CELEXA ?Take 1 tablet (40 mg total) by mouth daily. ?What changed: when to take this ?  ?Fluticasone-Salmeterol 250-50 MCG/DOSE Aepb ?Commonly known as: ADVAIR ?Inhale 1 puff into the lungs 2 (two) times daily. ?  ?gabapentin 300 MG capsule ?Commonly known as: NEURONTIN ?Take 300 mg by mouth at bedtime. ?  ?lamoTRIgine 200 MG tablet ?Commonly known as: LAMICTAL ?Take 200 mg by mouth 3 (three) times daily. ?  ?levETIRAcetam 500 MG tablet ?Commonly known as: KEPPRA ?Take 500 mg by mouth at bedtime. ?  ?losartan 25 MG tablet ?Commonly known as: COZAAR ?Take 25 mg by mouth at bedtime. ?  ?ondansetron 4 MG disintegrating tablet ?Commonly known as: ZOFRAN-ODT ?Take 1 tablet (4 mg total) by mouth every 8 (eight) hours as needed for nausea or vomiting. ?  ?oxyCODONE-acetaminophen 5-325 MG tablet ?Commonly known as: Percocet ?Take 1 tablet by mouth every 4 (four) hours as needed for severe pain. ?  ?ProAir HFA 108 (90 Base) MCG/ACT inhaler ?Generic drug: albuterol ?Inhale 2 puffs into the lungs every 6 (six) hours as needed for wheezing or shortness of breath. ?  ?VITAMIN A PO ?  Take 6,000 Units by mouth daily. ?  ?vitamin B-12 1000 MCG tablet ?Commonly known as: CYANOCOBALAMIN ?Take 1,000 mcg by mouth 2 (two) times daily. ?  ?vitamin C 500 MG tablet ?Commonly known as: ASCORBIC ACID ?Take 500 mg by mouth daily. ?  ?Vitamin D 50 MCG (2000 UT) tablet ?Take 2,000 Units by mouth daily. ?  ? ?  ? ? ?Activity: no heavy lifting for 4 weeks ?Diet: regular  diet ?Wound Care: keep wound clean and dry ? ?Follow-up:  With Dr. Dossie Der and Dr. Arita Miss ? ? ?Signed: ?Hyman Hopes Syna Gad ?General, Bariatric, & Minimally Invasive Surgery ?[facility], [place] ? ? ?04/05/2022, 12:05 PM ? ?

## 2022-04-05 NOTE — Progress Notes (Signed)
2 Days Post-Op  ? ?Subjective/Chief Complaint: ?Patient feels like she is doing well, she is passing flatus. ?Patient is admitted to bowel movement. ?She is otherwise ambulating well and tolerating p.o. ? ? ?Objective: ?Vital signs in last 24 hours: ?Temp:  [98 ?F (36.7 ?C)-98.6 ?F (37 ?C)] 98.3 ?F (36.8 ?C) (04/23 0753) ?Pulse Rate:  [81-90] 83 (04/23 0753) ?Resp:  [18-19] 18 (04/23 0753) ?BP: (104-124)/(53-65) 124/65 (04/23 0753) ?SpO2:  [95 %-97 %] 96 % (04/23 0753) ?Last BM Date : 04/04/22 ? ?Intake/Output from previous day: ?04/22 0701 - 04/23 0700 ?In: 720 [P.O.:720] ?Out: 2135 [Urine:2000; Drains:135] ?Intake/Output this shift: ?No intake/output data recorded. ? ?General appearance: alert and cooperative ?GI: soft, non-tender; bowel sounds normal; no masses,  no organomegaly and JPs are serosanguineous ? ?Lab Results:  ?Recent Labs  ?  04/04/22 ?0106 04/05/22 ?4193  ?WBC 17.6* 11.0*  ?HGB 11.9* 11.5*  ?HCT 37.3 35.5*  ?PLT 321 305  ? ?BMET ?Recent Labs  ?  04/04/22 ?0106 04/05/22 ?7902  ?NA 138 140  ?K 4.2 3.7  ?CL 104 107  ?CO2 26 28  ?GLUCOSE 109* 96  ?BUN <5* 5*  ?CREATININE 0.63 0.51  ?CALCIUM 8.7* 8.5*  ? ?PT/INR ?No results for input(s): LABPROT, INR in the last 72 hours. ?ABG ?No results for input(s): PHART, HCO3 in the last 72 hours. ? ?Invalid input(s): PCO2, PO2 ? ?Studies/Results: ?No results found. ? ?Anti-infectives: ?Anti-infectives (From admission, onward)  ? ? Start     Dose/Rate Route Frequency Ordered Stop  ? 04/03/22 0900  ceFAZolin (ANCEF) IVPB 2g/100 mL premix       ? 2 g ?200 mL/hr over 30 Minutes Intravenous On call to O.R. 04/03/22 0857 04/03/22 1012  ? ?  ? ? ?Assessment/Plan: ?s/p Procedure(s): ?OPEN RECURRENT INCISIONAL HERNIA REPAIR WITH AND BILATERAL POSTERIOR RECTUS MYOFASCIAL RELEASE (Bilateral) ?PANNICULECTOMY (N/A) ?INSERTION OF MESH (N/A) ?Plan for discharge tomorrow hopefully pass bowel movement ?Continue with diet as tolerated ?Continue to ambulate. ? LOS: 0 days   ? ? ?Axel Filler ?04/05/2022 ? ?

## 2022-04-06 ENCOUNTER — Encounter (HOSPITAL_COMMUNITY): Payer: Self-pay | Admitting: Surgery

## 2022-04-06 LAB — SURGICAL PATHOLOGY

## 2022-04-06 NOTE — Anesthesia Postprocedure Evaluation (Signed)
Anesthesia Post Note ? ?Patient: Rachel [name] ? ?Procedure(s) Performed: OPEN RECURRENT INCISIONAL HERNIA REPAIR WITH AND BILATERAL POSTERIOR RECTUS MYOFASCIAL RELEASE (Bilateral) ?INSERTION OF MESH (Abdomen) ?PANNICULECTOMY ? ?  ? ?Patient location during evaluation: PACU ?Anesthesia Type: General ?Level of consciousness: awake and alert ?Pain management: pain level controlled ?Vital Signs Assessment: post-procedure vital signs reviewed and stable ?Respiratory status: spontaneous breathing, nonlabored ventilation, respiratory function stable and patient connected to nasal cannula oxygen ?Cardiovascular status: blood pressure returned to baseline and stable ?Postop Assessment: no apparent nausea or vomiting ?Anesthetic complications: no ? ? ?No notable events documented. ? ?Last Vitals:  ?Vitals:  ? 04/05/22 0748 04/05/22 0753  ?BP:  124/65  ?Pulse:  83  ?Resp:  18  ?Temp:  36.8 ?C  ?SpO2: 96% 96%  ?  ?Last Pain:  ?Vitals:  ? 04/05/22 0900  ?TempSrc:   ?PainSc: 0-No pain  ? ? ?  ?  ?  ?  ?  ?  ? ?Benny Henrie L Emory Gallentine ? ? ? ? ?

## 2022-04-09 ENCOUNTER — Ambulatory Visit (INDEPENDENT_AMBULATORY_CARE_PROVIDER_SITE_OTHER): Payer: Medicare Other | Admitting: Plastic Surgery

## 2022-04-09 ENCOUNTER — Encounter: Payer: Self-pay | Admitting: Plastic Surgery

## 2022-04-09 DIAGNOSIS — M793 Panniculitis, unspecified: Secondary | ICD-10-CM

## 2022-04-09 NOTE — Progress Notes (Signed)
Patient presents postop from infraumbilical panniculectomy combined with ventral hernia repair done by general surgery.  She feels like things are coming along well and her mobility is improving.  She did stay in the hospital for 2 days to wait for return of bowel function.  She reports minimal output from the right JP drain since surgery so was removed today.  Her incision looks to be healing fine with no obvious subcutaneous fluid.  No erythema or signs of any wound healing complication.  We will plan to continue compressive garments and avoid strenuous activity and see her again next week.  I have asked her to try and keep up with the drain output so that we have that information and can to determine if the left drain is ready to be removed.  All of her questions were answered. ?

## 2022-04-12 DIAGNOSIS — E1165 Type 2 diabetes mellitus with hyperglycemia: Secondary | ICD-10-CM | POA: Diagnosis not present

## 2022-04-12 DIAGNOSIS — G43009 Migraine without aura, not intractable, without status migrainosus: Secondary | ICD-10-CM | POA: Diagnosis not present

## 2022-04-12 DIAGNOSIS — E785 Hyperlipidemia, unspecified: Secondary | ICD-10-CM | POA: Diagnosis not present

## 2022-04-15 DIAGNOSIS — G4733 Obstructive sleep apnea (adult) (pediatric): Secondary | ICD-10-CM | POA: Diagnosis not present

## 2022-04-15 NOTE — Progress Notes (Signed)
Patient is a pleasant 41 year old female with PMH of panniculitis s/p panniculectomy performed 04/03/2022 by Dr. Arita Miss at time of ventral hernia repair by Dr. Dossie Der.   ? ?She was last seen here in clinic on 04/09/2022.  At that time, there is no obvious subcutaneous fluid collection appreciated and her incision appeared to be healing nicely.  Plan is for continued activity modifications and compressive garments.  Plan was for her to monitor drain output from left-sided drain with likely removal at subsequent appointment. ? ?Today, patient is doing well.  She states that she has not had to take her narcotic pain control meds.  Tylenol only occasionally.  She has been wearing her abdominal binder 24/7.  She states that the drainage from her left-sided JP drain is slowing.  Denies any significant change in character of output.  She had a tough few days following surgery while she was hospitalized and experiencing thrombophlebitis from upper left arm IV placement, but has since improved. ? ?Physical exam is entirely reassuring.  Incisions appear to be well-healed.  Mildly dry.  No significant wounds or drainage noted.  No concerning areas of erythema.  Bellybutton remains midline.  No obvious hematoma or seroma.  JP drain is intact and functional, normal-appearing drainage in bulb and tube. ? ?Left-sided JP drain had been downtrending from 30 cc to 15 cc over the course the past few days.  Removed here at bedside without complication or difficulty. ? ?Recommending Vaseline and gauze to the JP drain tube insertion site.  Vaseline over the remainder of her incisions as they appear to be mildly dry.  Otherwise, she is healing quite nicely.  Told patient that she can transition to spanks or other compressive undergarments rather than continued binder.   ? ?Follow-up in 3 weeks.  Picture(s) obtained of the patient and placed in the chart were with the patient's or guardian's permission. ? ?

## 2022-04-17 ENCOUNTER — Ambulatory Visit (INDEPENDENT_AMBULATORY_CARE_PROVIDER_SITE_OTHER): Payer: Medicare Other | Admitting: Physician Assistant

## 2022-04-17 DIAGNOSIS — Z9889 Other specified postprocedural states: Secondary | ICD-10-CM

## 2022-05-08 ENCOUNTER — Ambulatory Visit: Payer: Medicare Other | Admitting: Physician Assistant

## 2022-05-08 NOTE — Progress Notes (Unsigned)
Patient is a pleasant 41 year old female with PMH of panniculitis s/p panniculectomy performed 04/03/2022 by Dr. Claudia Desanctis at time of ventral hernia repair by Dr. Thermon Leyland.    She was last seen here in clinic on 04/17/2022.  At that time, JP drain removed without complication or difficulty.  Exam was reassuring.  Incisions appear to be well-healed.  Bellybutton midline.  Plan is for continued activity modifications and compressive garments, return in 3 weeks.  Today,

## 2022-05-16 DIAGNOSIS — G4733 Obstructive sleep apnea (adult) (pediatric): Secondary | ICD-10-CM | POA: Diagnosis not present

## 2022-06-15 DIAGNOSIS — G4733 Obstructive sleep apnea (adult) (pediatric): Secondary | ICD-10-CM | POA: Diagnosis not present

## 2022-06-30 ENCOUNTER — Other Ambulatory Visit: Payer: Self-pay | Admitting: Physician Assistant

## 2022-07-13 DIAGNOSIS — E1165 Type 2 diabetes mellitus with hyperglycemia: Secondary | ICD-10-CM | POA: Diagnosis not present

## 2022-07-13 DIAGNOSIS — E785 Hyperlipidemia, unspecified: Secondary | ICD-10-CM | POA: Diagnosis not present

## 2022-07-13 DIAGNOSIS — G43009 Migraine without aura, not intractable, without status migrainosus: Secondary | ICD-10-CM | POA: Diagnosis not present

## 2022-07-16 DIAGNOSIS — G4733 Obstructive sleep apnea (adult) (pediatric): Secondary | ICD-10-CM | POA: Diagnosis not present

## 2022-08-16 DIAGNOSIS — G4733 Obstructive sleep apnea (adult) (pediatric): Secondary | ICD-10-CM | POA: Diagnosis not present

## 2022-09-15 DIAGNOSIS — G4733 Obstructive sleep apnea (adult) (pediatric): Secondary | ICD-10-CM | POA: Diagnosis not present

## 2022-10-13 DIAGNOSIS — E1165 Type 2 diabetes mellitus with hyperglycemia: Secondary | ICD-10-CM | POA: Diagnosis not present

## 2022-10-13 DIAGNOSIS — E785 Hyperlipidemia, unspecified: Secondary | ICD-10-CM | POA: Diagnosis not present

## 2023-04-05 ENCOUNTER — Other Ambulatory Visit: Payer: Self-pay | Admitting: Nurse Practitioner

## 2023-05-14 ENCOUNTER — Other Ambulatory Visit: Payer: Medicare Other

## 2023-05-14 DIAGNOSIS — E669 Obesity, unspecified: Secondary | ICD-10-CM

## 2023-05-14 DIAGNOSIS — I10 Essential (primary) hypertension: Secondary | ICD-10-CM | POA: Diagnosis not present

## 2023-05-14 DIAGNOSIS — R7301 Impaired fasting glucose: Secondary | ICD-10-CM | POA: Diagnosis not present

## 2023-05-14 DIAGNOSIS — E785 Hyperlipidemia, unspecified: Secondary | ICD-10-CM | POA: Diagnosis not present

## 2023-05-15 LAB — CBC WITH DIFFERENTIAL
Basophils Absolute: 0.1 10*3/uL (ref 0.0–0.2)
Basos: 1 %
EOS (ABSOLUTE): 0.5 10*3/uL — ABNORMAL HIGH (ref 0.0–0.4)
Eos: 5 %
Hematocrit: 46.4 % (ref 34.0–46.6)
Hemoglobin: 15.4 g/dL (ref 11.1–15.9)
Immature Grans (Abs): 0 10*3/uL (ref 0.0–0.1)
Immature Granulocytes: 0 %
Lymphocytes Absolute: 2.6 10*3/uL (ref 0.7–3.1)
Lymphs: 27 %
MCH: 28.9 pg (ref 26.6–33.0)
MCHC: 33.2 g/dL (ref 31.5–35.7)
MCV: 87 fL (ref 79–97)
Monocytes Absolute: 0.5 10*3/uL (ref 0.1–0.9)
Monocytes: 5 %
Neutrophils Absolute: 6 10*3/uL (ref 1.4–7.0)
Neutrophils: 62 %
RBC: 5.33 x10E6/uL — ABNORMAL HIGH (ref 3.77–5.28)
RDW: 12.7 % (ref 11.7–15.4)
WBC: 9.8 10*3/uL (ref 3.4–10.8)

## 2023-05-15 LAB — HEMOGLOBIN A1C
Est. average glucose Bld gHb Est-mCnc: 128 mg/dL
Hgb A1c MFr Bld: 6.1 % — ABNORMAL HIGH (ref 4.8–5.6)

## 2023-05-15 LAB — CMP14+EGFR
ALT: 23 IU/L (ref 0–32)
AST: 21 IU/L (ref 0–40)
Albumin/Globulin Ratio: 1.9 (ref 1.2–2.2)
Albumin: 4.3 g/dL (ref 3.9–4.9)
Alkaline Phosphatase: 128 IU/L — ABNORMAL HIGH (ref 44–121)
BUN/Creatinine Ratio: 9 (ref 9–23)
BUN: 6 mg/dL (ref 6–24)
Bilirubin Total: 0.3 mg/dL (ref 0.0–1.2)
CO2: 24 mmol/L (ref 20–29)
Calcium: 9.2 mg/dL (ref 8.7–10.2)
Chloride: 105 mmol/L (ref 96–106)
Creatinine, Ser: 0.64 mg/dL (ref 0.57–1.00)
Globulin, Total: 2.3 g/dL (ref 1.5–4.5)
Glucose: 86 mg/dL (ref 70–99)
Potassium: 4.4 mmol/L (ref 3.5–5.2)
Sodium: 142 mmol/L (ref 134–144)
Total Protein: 6.6 g/dL (ref 6.0–8.5)
eGFR: 113 mL/min/{1.73_m2} (ref >59)

## 2023-05-15 LAB — LIPID PANEL
Chol/HDL Ratio: 2.8 ratio (ref 0.0–4.4)
Cholesterol, Total: 108 mg/dL (ref 100–199)
HDL: 38 mg/dL — ABNORMAL LOW (ref >39)
LDL Chol Calc (NIH): 50 mg/dL (ref 0–99)
Triglycerides: 111 mg/dL (ref 0–149)
VLDL Cholesterol Cal: 20 mg/dL (ref 5–40)

## 2023-05-15 LAB — TSH: TSH: 2.2 u[IU]/mL (ref 0.450–4.500)

## 2023-05-16 DIAGNOSIS — S60211A Contusion of right wrist, initial encounter: Secondary | ICD-10-CM | POA: Diagnosis not present

## 2023-05-17 ENCOUNTER — Ambulatory Visit: Payer: Medicare Other | Admitting: Family

## 2023-05-17 ENCOUNTER — Ambulatory Visit: Payer: Medicare Other | Admitting: Nurse Practitioner

## 2023-05-22 DIAGNOSIS — S8991XA Unspecified injury of right lower leg, initial encounter: Secondary | ICD-10-CM | POA: Diagnosis not present

## 2023-05-31 DIAGNOSIS — S8991XA Unspecified injury of right lower leg, initial encounter: Secondary | ICD-10-CM | POA: Diagnosis not present

## 2023-05-31 DIAGNOSIS — M25531 Pain in right wrist: Secondary | ICD-10-CM | POA: Diagnosis not present

## 2023-05-31 DIAGNOSIS — S8991XD Unspecified injury of right lower leg, subsequent encounter: Secondary | ICD-10-CM | POA: Diagnosis not present

## 2023-07-01 DIAGNOSIS — G4733 Obstructive sleep apnea (adult) (pediatric): Secondary | ICD-10-CM | POA: Diagnosis not present

## 2023-07-09 ENCOUNTER — Other Ambulatory Visit: Payer: Self-pay | Admitting: Nurse Practitioner

## 2023-07-15 ENCOUNTER — Encounter (HOSPITAL_COMMUNITY): Payer: Self-pay | Admitting: *Deleted

## 2023-07-23 ENCOUNTER — Other Ambulatory Visit: Payer: Self-pay | Admitting: Nurse Practitioner

## 2023-07-29 ENCOUNTER — Other Ambulatory Visit: Payer: Self-pay

## 2023-07-29 DIAGNOSIS — F419 Anxiety disorder, unspecified: Secondary | ICD-10-CM

## 2023-07-29 MED ORDER — LAMOTRIGINE 200 MG PO TABS
200.0000 mg | ORAL_TABLET | Freq: Three times a day (TID) | ORAL | 3 refills | Status: DC
Start: 2023-07-29 — End: 2024-01-14

## 2023-07-29 MED ORDER — CITALOPRAM HYDROBROMIDE 40 MG PO TABS: 40.0000 mg | ORAL_TABLET | Freq: Every day | ORAL | 2 refills | Status: DC

## 2023-08-31 DIAGNOSIS — G4733 Obstructive sleep apnea (adult) (pediatric): Secondary | ICD-10-CM | POA: Diagnosis not present

## 2023-10-12 ENCOUNTER — Other Ambulatory Visit: Payer: Self-pay | Admitting: Cardiology

## 2023-10-29 ENCOUNTER — Other Ambulatory Visit: Payer: Self-pay | Admitting: Cardiology

## 2023-10-29 DIAGNOSIS — F419 Anxiety disorder, unspecified: Secondary | ICD-10-CM

## 2023-12-03 ENCOUNTER — Other Ambulatory Visit: Payer: Self-pay | Admitting: Cardiology

## 2023-12-03 DIAGNOSIS — F419 Anxiety disorder, unspecified: Secondary | ICD-10-CM

## 2023-12-23 ENCOUNTER — Encounter: Payer: Self-pay | Admitting: Cardiology

## 2023-12-23 ENCOUNTER — Ambulatory Visit
Admission: RE | Admit: 2023-12-23 | Discharge: 2023-12-23 | Disposition: A | Discharge status: Home / Self Care | Payer: Medicare Other | Attending: Cardiology | Admitting: Cardiology

## 2023-12-23 ENCOUNTER — Ambulatory Visit (INDEPENDENT_AMBULATORY_CARE_PROVIDER_SITE_OTHER): Payer: Medicare Other | Admitting: Cardiology

## 2023-12-23 ENCOUNTER — Ambulatory Visit
Admission: RE | Admit: 2023-12-23 | Discharge: 2023-12-23 | Disposition: A | Discharge status: Home / Self Care | Payer: Medicare Other | Source: Ambulatory Visit | Attending: Cardiology | Admitting: Cardiology

## 2023-12-23 VITALS — BP 124/62 | HR 101 | Ht 65.0 in | Wt 224.0 lb

## 2023-12-23 DIAGNOSIS — R9389 Abnormal findings on diagnostic imaging of other specified body structures: Secondary | ICD-10-CM | POA: Diagnosis not present

## 2023-12-23 DIAGNOSIS — J069 Acute upper respiratory infection, unspecified: Secondary | ICD-10-CM | POA: Insufficient documentation

## 2023-12-23 DIAGNOSIS — R0602 Shortness of breath: Secondary | ICD-10-CM | POA: Diagnosis not present

## 2023-12-23 DIAGNOSIS — F419 Anxiety disorder, unspecified: Secondary | ICD-10-CM | POA: Diagnosis not present

## 2023-12-23 DIAGNOSIS — R059 Cough, unspecified: Secondary | ICD-10-CM | POA: Diagnosis not present

## 2023-12-23 MED ORDER — DOXYCYCLINE HYCLATE 100 MG PO TABS: 100.0000 mg | ORAL_TABLET | Freq: Two times a day (BID) | ORAL | 0 refills | Status: DC

## 2023-12-23 MED ORDER — CETIRIZINE HCL 10 MG PO TABS: 10.0000 mg | ORAL_TABLET | Freq: Every day | ORAL | 3 refills | Status: DC

## 2023-12-23 MED ORDER — BENZONATATE 100 MG PO CAPS
100.0000 mg | ORAL_CAPSULE | Freq: Three times a day (TID) | ORAL | 1 refills | Status: DC | PRN
Start: 2023-12-23 — End: 2023-12-30

## 2023-12-23 MED ORDER — METHYLPREDNISOLONE 4 MG PO TBPK: ORAL_TABLET | ORAL | 0 refills | Status: DC

## 2023-12-23 MED ORDER — CITALOPRAM HYDROBROMIDE 40 MG PO TABS: 40.0000 mg | ORAL_TABLET | Freq: Every day | ORAL | 2 refills | Status: DC

## 2023-12-23 NOTE — Progress Notes (Signed)
 Patient informed.

## 2023-12-23 NOTE — Progress Notes (Signed)
 Established Patient Office Visit  Subjective:  Patient ID: Rachel [name], female    DOB: December 08, 1981  Age: 43 y.o. MRN: [id number]  Chief Complaint  Patient presents with   Acute Visit    SOB, Chest Congestion since 12/15/23    Patient in office for an acute visit, complaining of shortness of breath and chest congestion for 8 days. Scratchy throat, denies PND. Patient states she tested negative for Covid and Flu on 12/22/23. Will send in Doxycycline  and prednisone due to patient's comorbidites. Tessalon  pearls sent in. Will get a stat chest xray to rule out pneumonia. Continue Mucinex , increase fluid intake.   URI  This is a new problem. The current episode started in the past 7 days. The problem has been gradually worsening. There has been no fever. Associated symptoms include congestion, coughing, ear pain, headaches, a plugged ear sensation, rhinorrhea and sneezing. Pertinent negatives include no abdominal pain, chest pain, diarrhea, joint pain, sinus pain or sore throat. She has tried inhaler use, antihistamine, increased fluids and acetaminophen  (Mucinex , Dayquil, Nyquil) for the symptoms. The treatment provided no relief.    No other concerns at this time.   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   CARPAL TUNNEL RELEASE Right 01/27/2017   Procedure: CARPAL TUNNEL RELEASE;  Surgeon: Helayne Glenn, MD;  Location: [facility];  Service: Orthopedics;  Laterality: Right;   COLON SURGERY  2014   bowel resection/colostomy    COLOSTOMY REVISION N/A 11/07/2013   Procedure: COLON RESECTION SIGMOID;  Surgeon: Lynwood MALVA Pina, MD;  Location: [facility];  Service: General;  Laterality: N/A;   GASTRIC ROUX-EN-Y N/A 01/29/2020   Procedure: LAPAROSCOPIC ROUX-EN-Y GASTRIC BYPASS WITH UPPER ENDOSCOPY, REDUCTION of Incarcerated Hernia, Laproscopic Open Repair of Large Lower 10 cm hernia;  Surgeon: Gladis Cough, MD;  Location: [facility];  Service: General;  Laterality: N/A;   INCISIONAL HERNIA REPAIR Bilateral 04/03/2022   Procedure: OPEN RECURRENT INCISIONAL HERNIA REPAIR WITH AND BILATERAL POSTERIOR RECTUS MYOFASCIAL RELEASE;  Surgeon: Lyndel Deward PARAS, MD;  Location: [facility];  Service: General;  Laterality: Bilateral;   INSERTION OF MESH N/A 04/03/2022   Procedure: INSERTION OF MESH;  Surgeon: Lyndel Deward PARAS, MD;  Location: [facility];  Service: General;  Laterality: N/A;   PANNICULECTOMY N/A 04/03/2022   Procedure: PANNICULECTOMY;  Surgeon: Elisabeth Craig RAMAN, MD;  Location: [facility];  Service: Plastics;  Laterality: N/A;    Social History   Socioeconomic History   Marital status: Married    Spouse name: Not on file   Number of children: 1   Years of education: Not on file   Highest education level: Not on file  Occupational History   Not on file  Tobacco Use   Smoking status: Former    Current packs/day: 0.00    Average packs/day: 1 pack/day for 18.0 years (18.0 ttl pk-yrs)    Types: Cigarettes    Start date:  11/18/2003    Quit date: 11/17/2021    Years since quitting: 2.0   Smokeless tobacco: Never  Vaping Use   Vaping status: Never Used  Substance and Sexual Activity   Alcohol use: No   Drug use: No   Sexual activity: Yes    Birth control/protection: I.U.D.  Other Topics Concern   Not on file  Social History Narrative   Not on file   Social Drivers of Health   Financial Resource Strain: Not on file  Food Insecurity: Not on file  Transportation Needs: Not on file  Physical Activity: Not on file  Stress: Not  on file  Social Connections: Not on file  Intimate Partner Violence: Not on file    Family History  Problem Relation Age of Onset   Cancer Maternal Grandfather        prostate   Cancer Maternal Aunt        breast   Breast cancer Maternal Aunt    Breast cancer Paternal Aunt     Allergies  Allergen Reactions   Hand Sanitizer [Flavoring Agent] Hives   Codeine     HALLUCINATIONS   Morphine  And Codeine Itching   Phenergan [Promethazine Hcl] Nausea And Vomiting    Outpatient Medications Prior to Visit  Medication Sig   acetaminophen  (TYLENOL ) 160 MG/5ML liquid Take 500 mg by mouth every 4 (four) hours as needed for pain.   atorvastatin  (LIPITOR) 40 MG tablet TAKE ONE TABLET BY MOUTH ONCE A DAY IN THE EVENING FOR CHOLESTEROL, FASTING LABS NEEDED.   CALCIUM  PO Take 650 mg by mouth daily.   Cholecalciferol (VITAMIN D) 50 MCG (2000 UT) tablet Take 2,000 Units by mouth daily.   Fluticasone -Salmeterol (ADVAIR) 250-50 MCG/DOSE AEPB Inhale 1 puff into the lungs 2 (two) times daily.   gabapentin  (NEURONTIN ) 300 MG capsule Take 300 mg by mouth at bedtime.   hydrOXYzine (ATARAX) 10 MG tablet TAKE ONE HALF (1/2) TO ONE TABLET BY MOUTH TWO TIMES DAILY AS NEEDED FOR ANXIETY/MOMENTS OF HIGH ANXIETY   lamoTRIgine  (LAMICTAL ) 200 MG tablet Take 1 tablet (200 mg total) by mouth 3 (three) times daily.   levETIRAcetam  (KEPPRA ) 500 MG tablet TAKE ONE TABLET BY MOUTH EVERY NIGHT AT BEDTIME   losartan  (COZAAR ) 25 MG tablet TAKE ONE TABLET BY MOUTH ONCE A DAY FOR KIDNEY PROTECTION   Multiple Vitamins-Minerals (BARIATRIC MULTIVITAMINS/IRON PO) Take 1 tablet by mouth daily.   ondansetron  (ZOFRAN -ODT) 4 MG disintegrating tablet Take 1 tablet (4 mg total) by mouth every 8 (eight) hours as needed for nausea or vomiting.   PROAIR  HFA 108 (90 Base) MCG/ACT inhaler Inhale 2 puffs into the lungs every 6 (six) hours as needed for wheezing or shortness of breath.   VITAMIN A PO Take 6,000 Units by mouth daily.    vitamin B-12 (CYANOCOBALAMIN) 1000 MCG tablet Take 1,000 mcg by mouth 2 (two) times daily.   vitamin C (ASCORBIC ACID) 500 MG tablet Take 500 mg by mouth daily.   [DISCONTINUED] cetirizine  (ZYRTEC ) 10 MG tablet TAKE ONE TABLET BY MOUTH EVERY NIGHT AT BEDTIME FOR ALLERGIES   [DISCONTINUED] citalopram  (CELEXA ) 40 MG tablet Take 1 tablet (40 mg total) by mouth daily.   No facility-administered medications prior to visit.    Review of Systems  Constitutional: Negative.   HENT:  Positive for congestion, ear pain, rhinorrhea and sneezing. Negative for sinus pain and sore throat.   Eyes: Negative.   Respiratory:  Positive for cough, sputum production and shortness of breath.  Cardiovascular: Negative.  Negative for chest pain.  Gastrointestinal: Negative.  Negative for abdominal pain, constipation and diarrhea.  Genitourinary: Negative.   Musculoskeletal:  Negative for joint pain and myalgias.  Skin: Negative.   Neurological:  Positive for headaches. Negative for dizziness.  Endo/Heme/Allergies: Negative.   All other systems reviewed and are negative.      Objective:   BP 124/62   Pulse (!) 101   Ht 5' 5 (1.651 m)   Wt 224 lb (101.6 kg)   SpO2 96%   BMI 37.28 kg/m   Vitals:   12/23/23 1312  BP: 124/62  Pulse: (!) 101  Height: 5' 5 (1.651 m)  Weight: 224 lb (101.6 kg)  SpO2: 96%  BMI (Calculated): 37.28    Physical Exam Vitals and nursing note reviewed.  Constitutional:      Appearance: Normal appearance. She is normal weight.  HENT:     Head: Normocephalic and atraumatic.     Nose: Nose normal.     Mouth/Throat:     Mouth: Mucous membranes are moist.  Eyes:     Extraocular Movements: Extraocular movements intact.     Conjunctiva/sclera: Conjunctivae normal.     Pupils: Pupils are equal, round, and reactive to light.  Cardiovascular:     Rate and Rhythm: Normal rate and regular rhythm.     Pulses: Normal pulses.     Heart sounds: Normal heart sounds.   Pulmonary:     Effort: Pulmonary effort is normal.     Breath sounds: Stridor present. Wheezing present.  Abdominal:     General: Abdomen is flat. Bowel sounds are normal.     Palpations: Abdomen is soft.  Musculoskeletal:        General: Normal range of motion.     Cervical back: Normal range of motion.  Skin:    General: Skin is warm and dry.  Neurological:     General: No focal deficit present.     Mental Status: She is alert and oriented to person, place, and time.  Psychiatric:        Mood and Affect: Mood normal.        Behavior: Behavior normal.        Thought Content: Thought content normal.        Judgment: Judgment normal.      No results found for any visits on 12/23/23.  No results found for this or any previous visit (from the past 2160 hours).    Assessment & Plan:  Chest xray Doxycycline  Prednisone Mucinex  Tessalon  pearls Increase fluid intake  Problem List Items Addressed This Visit       Respiratory   Acute URI - Primary   Relevant Orders   DG Chest 2 View   Other Visit Diagnoses       Anxiety       Relevant Medications   citalopram  (CELEXA ) 40 MG tablet       Return if symptoms worsen or fail to improve.   Total time spent: 25 minutes  Google, NP  12/23/2023   This document may have been prepared by Dragon Voice Recognition software and as such may include unintentional dictation errors.

## 2023-12-30 ENCOUNTER — Encounter: Payer: Self-pay | Admitting: Cardiology

## 2023-12-30 ENCOUNTER — Ambulatory Visit (INDEPENDENT_AMBULATORY_CARE_PROVIDER_SITE_OTHER): Payer: Medicare Other | Admitting: Cardiology

## 2023-12-30 VITALS — BP 118/70 | HR 72 | Ht 65.0 in | Wt 224.6 lb

## 2023-12-30 DIAGNOSIS — Z1231 Encounter for screening mammogram for malignant neoplasm of breast: Secondary | ICD-10-CM

## 2023-12-30 DIAGNOSIS — F419 Anxiety disorder, unspecified: Secondary | ICD-10-CM | POA: Diagnosis not present

## 2023-12-30 DIAGNOSIS — Z1329 Encounter for screening for other suspected endocrine disorder: Secondary | ICD-10-CM | POA: Diagnosis not present

## 2023-12-30 DIAGNOSIS — E782 Mixed hyperlipidemia: Secondary | ICD-10-CM | POA: Diagnosis not present

## 2023-12-30 DIAGNOSIS — Z131 Encounter for screening for diabetes mellitus: Secondary | ICD-10-CM

## 2023-12-30 DIAGNOSIS — Z0130 Encounter for examination of blood pressure without abnormal findings: Secondary | ICD-10-CM

## 2023-12-30 MED ORDER — FLUTICASONE-SALMETEROL 250-50 MCG/ACT IN AEPB: 1.0000 | INHALATION_SPRAY | Freq: Two times a day (BID) | RESPIRATORY_TRACT | 3 refills | Status: AC

## 2023-12-30 MED ORDER — CITALOPRAM HYDROBROMIDE 40 MG PO TABS: 40.0000 mg | ORAL_TABLET | Freq: Every day | ORAL | 1 refills | Status: DC

## 2023-12-30 MED ORDER — ATORVASTATIN CALCIUM 40 MG PO TABS: 40.0000 mg | ORAL_TABLET | Freq: Every day | ORAL | 1 refills | Status: DC

## 2023-12-30 NOTE — Progress Notes (Signed)
Established Patient Office Visit  Subjective:  Patient ID: Rachel [name], female    DOB: February 17, 1981  Age: 43 y.o. MRN: [id number]  Chief Complaint  Patient presents with   Follow-up    1 week follow up    Patient in office for routine follow up. Has not had blood work done recently. Will return for fasting lab work. Over due for mammogram, order placed. Sees GYN for her pap smears.     No other concerns at this time.   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   CARPAL TUNNEL RELEASE Right 01/27/2017   Procedure: CARPAL TUNNEL RELEASE;  Surgeon: Erin Sons, MD;  Location: [facility];  Service: Orthopedics;  Laterality: Right;   COLON SURGERY  2014   bowel resection/colostomy   COLOSTOMY REVISION N/A 11/07/2013   Procedure: COLON RESECTION SIGMOID;  Surgeon: [name], MD;  Location: [facility];  Service: General;  Laterality: N/A;   GASTRIC ROUX-EN-Y N/A 01/29/2020   Procedure: LAPAROSCOPIC ROUX-EN-Y GASTRIC BYPASS WITH UPPER ENDOSCOPY, REDUCTION of Incarcerated Hernia, Laproscopic Open Repair of Large Lower 10 cm hernia;  Surgeon: Luretha Murphy, MD;  Location: [facility];  Service: General;  Laterality: N/A;   INCISIONAL HERNIA REPAIR Bilateral 04/03/2022   Procedure: OPEN RECURRENT INCISIONAL HERNIA REPAIR WITH AND BILATERAL POSTERIOR RECTUS MYOFASCIAL RELEASE;  Surgeon: Quentin Ore, MD;  Location: [facility];  Service: General;  Laterality: Bilateral;   INSERTION OF MESH N/A 04/03/2022   Procedure: INSERTION  OF MESH;  Surgeon: Quentin Ore, MD;  Location: [facility];  Service: General;  Laterality: N/A;   PANNICULECTOMY N/A 04/03/2022   Procedure: PANNICULECTOMY;  Surgeon: Allena Napoleon, MD;  Location: [facility];  Service: Plastics;  Laterality: N/A;    Social History   Socioeconomic History   Marital status: Married    Spouse name: Not on file   Number of children: 1   Years of education: Not on file   Highest education level: Not on file  Occupational History   Not on file  Tobacco Use   Smoking status: Former    Current packs/day: 0.00    Average packs/day: 1 pack/day for 18.0 years (18.0 ttl pk-yrs)    Types: Cigarettes    Start date: 11/18/2003    Quit date: 11/17/2021    Years since quitting: 2.1   Smokeless tobacco: Never  Vaping Use   Vaping status: Never Used  Substance and Sexual Activity   Alcohol use: No   Drug use: No   Sexual activity: Yes    Birth control/protection: I.U.D.  Other Topics Concern   Not on file  Social History Narrative   Not on file   Social Drivers of Health   Financial Resource Strain: Not on file  Food Insecurity: Not on file  Transportation Needs: Not on file  Physical Activity: Not on file  Stress: Not on file  Social Connections:  Not on file  Intimate Partner Violence: Not on file    Family History  Problem Relation Age of Onset   Cancer Maternal Grandfather        prostate   Cancer Maternal Aunt        breast   Breast cancer Maternal Aunt    Breast cancer Paternal Aunt     Allergies  Allergen Reactions   Hand Sanitizer [Flavoring Agent] Hives   Codeine     HALLUCINATIONS   Morphine And Codeine Itching   Phenergan [Promethazine Hcl] Nausea And Vomiting    Outpatient Medications Prior to Visit  Medication Sig   acetaminophen (TYLENOL) 160 MG/5ML liquid Take 500 mg by mouth every 4 (four) hours as needed for pain.   CALCIUM PO Take 650 mg by mouth daily.   cetirizine (ZYRTEC) 10 MG tablet Take 1 tablet (10 mg total) by  mouth daily.   Cholecalciferol (VITAMIN D) 50 MCG (2000 UT) tablet Take 2,000 Units by mouth daily.   gabapentin (NEURONTIN) 300 MG capsule Take 300 mg by mouth at bedtime.   hydrOXYzine (ATARAX) 10 MG tablet TAKE ONE HALF (1/2) TO ONE TABLET BY MOUTH TWO TIMES DAILY AS NEEDED FOR ANXIETY/MOMENTS OF HIGH ANXIETY   lamoTRIgine (LAMICTAL) 200 MG tablet Take 1 tablet (200 mg total) by mouth 3 (three) times daily.   levETIRAcetam (KEPPRA) 500 MG tablet TAKE ONE TABLET BY MOUTH EVERY NIGHT AT BEDTIME   losartan (COZAAR) 25 MG tablet TAKE ONE TABLET BY MOUTH ONCE A DAY FOR KIDNEY PROTECTION   Multiple Vitamins-Minerals (BARIATRIC MULTIVITAMINS/IRON PO) Take 1 tablet by mouth daily.   PROAIR HFA 108 (90 Base) MCG/ACT inhaler Inhale 2 puffs into the lungs every 6 (six) hours as needed for wheezing or shortness of breath.   VITAMIN A PO Take 6,000 Units by mouth daily.   vitamin B-12 (CYANOCOBALAMIN) 1000 MCG tablet Take 1,000 mcg by mouth 2 (two) times daily.   vitamin C (ASCORBIC ACID) 500 MG tablet Take 500 mg by mouth daily.   [DISCONTINUED] atorvastatin (LIPITOR) 40 MG tablet TAKE ONE TABLET BY MOUTH ONCE A DAY IN THE EVENING FOR CHOLESTEROL, FASTING LABS NEEDED.   [DISCONTINUED] benzonatate (TESSALON PERLES) 100 MG capsule Take 1 capsule (100 mg total) by mouth 3 (three) times daily as needed for cough.   [DISCONTINUED] citalopram (CELEXA) 40 MG tablet Take 1 tablet (40 mg total) by mouth daily.   [DISCONTINUED] doxycycline (VIBRA-TABS) 100 MG tablet Take 1 tablet (100 mg total) by mouth 2 (two) times daily for 7 days.   [DISCONTINUED] Fluticasone-Salmeterol (ADVAIR) 250-50 MCG/DOSE AEPB Inhale 1 puff into the lungs 2 (two) times daily.   [DISCONTINUED] methylPREDNISolone (MEDROL DOSEPAK) 4 MG TBPK tablet As directed   [DISCONTINUED] ondansetron (ZOFRAN-ODT) 4 MG disintegrating tablet Take 1 tablet (4 mg total) by mouth every 8 (eight) hours as needed for nausea or vomiting. (Patient not taking:  Reported on 12/30/2023)   No facility-administered medications prior to visit.    Review of Systems  Constitutional: Negative.   HENT: Negative.    Eyes: Negative.   Respiratory: Negative.  Negative for shortness of breath.   Cardiovascular: Negative.  Negative for chest pain.  Gastrointestinal: Negative.  Negative for abdominal pain, constipation and diarrhea.  Genitourinary: Negative.   Musculoskeletal:  Negative for joint pain and myalgias.  Skin: Negative.   Neurological: Negative.  Negative for dizziness and headaches.  Endo/Heme/Allergies: Negative.   All other systems reviewed and are negative.      Objective:  BP 118/70   Pulse 72   Ht 5\' 5"  (1.651 m)   Wt 224 lb 9.6 oz (101.9 kg)   SpO2 93%   BMI 37.38 kg/m   Vitals:   12/30/23 0903  BP: 118/70  Pulse: 72  Height: 5\' 5"  (1.651 m)  Weight: 224 lb 9.6 oz (101.9 kg)  SpO2: 93%  BMI (Calculated): 37.38    Physical Exam Vitals and nursing note reviewed.  Constitutional:      Appearance: Normal appearance. She is normal weight.  HENT:     Head: Normocephalic and atraumatic.     Nose: Nose normal.     Mouth/Throat:     Mouth: Mucous membranes are moist.  Eyes:     Extraocular Movements: Extraocular movements intact.     Conjunctiva/sclera: Conjunctivae normal.     Pupils: Pupils are equal, round, and reactive to light.  Cardiovascular:     Rate and Rhythm: Normal rate and regular rhythm.     Pulses: Normal pulses.     Heart sounds: Normal heart sounds.  Pulmonary:     Effort: Pulmonary effort is normal.     Breath sounds: Normal breath sounds. Stridor present.  Abdominal:     General: Abdomen is flat. Bowel sounds are normal.     Palpations: Abdomen is soft.  Musculoskeletal:        General: Normal range of motion.     Cervical back: Normal range of motion.  Skin:    General: Skin is warm and dry.  Neurological:     General: No focal deficit present.     Mental Status: She is alert and  oriented to person, place, and time.  Psychiatric:        Mood and Affect: Mood normal.        Behavior: Behavior normal.        Thought Content: Thought content normal.        Judgment: Judgment normal.      No results found for any visits on 12/30/23.  No results found for this or any previous visit (from the past 2160 hours).    Assessment & Plan:  Return for fasting lab work, will call with results. Mammogram order placed.  Problem List Items Addressed This Visit       Other   Mixed hyperlipidemia   Relevant Medications   atorvastatin (LIPITOR) 40 MG tablet   Other Relevant Orders   Lipid Profile   CMP14+EGFR   CBC with Differential/Platelet   Other Visit Diagnoses       Breast cancer screening by mammogram    -  Primary   Relevant Orders   MM 3D SCREENING MAMMOGRAM BILATERAL BREAST     Anxiety       Relevant Medications   citalopram (CELEXA) 40 MG tablet     Thyroid disorder screening       Relevant Orders   TSH   CBC with Differential/Platelet     Diabetes mellitus screening       Relevant Orders   Hemoglobin A1c   CMP14+EGFR   CBC with Differential/Platelet       Return in about 4 months (around 04/28/2024).   Total time spent: 25 minutes  Google, NP  12/30/2023   This document may have been prepared by Dragon Voice Recognition software and as such may include unintentional dictation errors.

## 2024-01-04 DIAGNOSIS — G4733 Obstructive sleep apnea (adult) (pediatric): Secondary | ICD-10-CM | POA: Diagnosis not present

## 2024-01-13 ENCOUNTER — Other Ambulatory Visit: Payer: Self-pay | Admitting: Cardiology

## 2024-05-12 DIAGNOSIS — G4733 Obstructive sleep apnea (adult) (pediatric): Secondary | ICD-10-CM | POA: Diagnosis not present

## 2024-06-28 ENCOUNTER — Ambulatory Visit
Admission: RE | Admit: 2024-06-28 | Discharge: 2024-06-28 | Disposition: A | Discharge status: Home / Self Care | Source: Ambulatory Visit | Attending: Cardiology | Admitting: Cardiology

## 2024-06-28 DIAGNOSIS — Z1231 Encounter for screening mammogram for malignant neoplasm of breast: Secondary | ICD-10-CM | POA: Insufficient documentation

## 2024-07-03 ENCOUNTER — Other Ambulatory Visit: Payer: Self-pay | Admitting: Cardiology

## 2024-07-03 DIAGNOSIS — R928 Other abnormal and inconclusive findings on diagnostic imaging of breast: Secondary | ICD-10-CM

## 2024-07-05 ENCOUNTER — Encounter: Payer: Self-pay | Admitting: Internal Medicine

## 2024-07-11 ENCOUNTER — Encounter: Payer: Self-pay | Admitting: Family Medicine

## 2024-07-14 ENCOUNTER — Other Ambulatory Visit: Payer: Self-pay | Admitting: Cardiology

## 2024-07-21 ENCOUNTER — Other Ambulatory Visit: Payer: Self-pay | Admitting: Cardiology

## 2024-07-21 DIAGNOSIS — F419 Anxiety disorder, unspecified: Secondary | ICD-10-CM

## 2024-07-31 ENCOUNTER — Other Ambulatory Visit: Payer: Self-pay | Admitting: Cardiology

## 2024-08-15 ENCOUNTER — Encounter (HOSPITAL_COMMUNITY): Payer: Self-pay | Admitting: *Deleted

## 2024-09-28 ENCOUNTER — Ambulatory Visit
Admission: EM | Admit: 2024-09-28 | Discharge: 2024-09-28 | Disposition: A | Discharge status: Other Acute Inpatient Hospital | Attending: Emergency Medicine | Admitting: Emergency Medicine

## 2024-09-28 DIAGNOSIS — R079 Chest pain, unspecified: Secondary | ICD-10-CM | POA: Diagnosis not present

## 2024-09-28 DIAGNOSIS — R0902 Hypoxemia: Secondary | ICD-10-CM

## 2024-09-28 DIAGNOSIS — R062 Wheezing: Secondary | ICD-10-CM | POA: Diagnosis not present

## 2024-09-28 DIAGNOSIS — R0602 Shortness of breath: Secondary | ICD-10-CM | POA: Diagnosis not present

## 2024-09-28 MED ORDER — IPRATROPIUM-ALBUTEROL 0.5-2.5 (3) MG/3ML IN SOLN
3.0000 mL | Freq: Once | RESPIRATORY_TRACT | Status: AC
  Administered 2024-09-28: 3 mL via RESPIRATORY_TRACT

## 2024-09-28 MED ORDER — ALBUTEROL SULFATE HFA 108 (90 BASE) MCG/ACT IN AERS
2.0000 | INHALATION_SPRAY | Freq: Once | RESPIRATORY_TRACT | Status: AC
  Administered 2024-09-28: 2 via RESPIRATORY_TRACT

## 2024-09-28 NOTE — ED Provider Notes (Signed)
 Patient presents for evaluation of chest pain to the center described as a stabbing, shortness of breath and wheezing beginning 2 days ago.  Pain does not radiate.  Denies fever, cough congestion, dizziness, lightheadedness.  Endorses a history of COPD, not documented on chart, documentation endorses asthma but patient denies.  Former smoker.  On initial triage O2 saturation 85% on room air, patient having difficulty speaking, 2 puffs of albuterol  inhaler given and placed on 2 L of oxygen with no improvement, oxygen increased to 6 L, oxygen level 94% at this time, no change in wheezing heard to auscultation, EKG showing normal sinus rhythm and EMS called for emergency department transport.    Teresa Shelba SAUNDERS, NP 09/28/24 1425

## 2024-09-28 NOTE — ED Notes (Signed)
 Patient is being discharged from the Urgent Care and sent to the Emergency Department via EMS . Per A. White, NP, patient is in need of higher level of care due to chest pain and SOB. Patient is aware and verbalizes understanding of plan of care.  Vitals:   09/28/24 1414  BP: 109/71  Pulse: 98  Resp: (!) 22  Temp: (!) 97.4 F (36.3 C)  SpO2: (!) 85%

## 2024-09-28 NOTE — ED Notes (Signed)
 Patient triage by A. White, NP

## 2024-10-02 ENCOUNTER — Ambulatory Visit: Payer: Self-pay | Admitting: Cardiology

## 2024-10-02 ENCOUNTER — Encounter: Payer: Self-pay | Admitting: Cardiology

## 2024-10-02 ENCOUNTER — Ambulatory Visit (INDEPENDENT_AMBULATORY_CARE_PROVIDER_SITE_OTHER): Admitting: Cardiology

## 2024-10-02 VITALS — BP 118/62 | HR 88 | Ht 65.0 in | Wt 224.0 lb

## 2024-10-02 DIAGNOSIS — J029 Acute pharyngitis, unspecified: Secondary | ICD-10-CM | POA: Insufficient documentation

## 2024-10-02 DIAGNOSIS — J351 Hypertrophy of tonsils: Secondary | ICD-10-CM | POA: Diagnosis not present

## 2024-10-02 DIAGNOSIS — J0301 Acute recurrent streptococcal tonsillitis: Secondary | ICD-10-CM | POA: Diagnosis not present

## 2024-10-02 DIAGNOSIS — Z0130 Encounter for examination of blood pressure without abnormal findings: Secondary | ICD-10-CM

## 2024-10-02 LAB — POCT RAPID STREP A (OFFICE): Rapid Strep A Screen: NEGATIVE

## 2024-10-02 MED ORDER — AMOXICILLIN 250 MG/5ML PO SUSR
500.0000 mg | Freq: Two times a day (BID) | ORAL | 0 refills | Status: AC
Start: 2024-10-02 — End: 2024-10-09

## 2024-10-02 NOTE — Progress Notes (Signed)
 Established Patient Office Visit  Subjective:  Patient ID: Rachel [name], female    DOB: [birth year]  Age: 43 y.o. MRN: [id number]  Chief Complaint  Patient presents with   Acute Visit    Possible strep throat    Patient in office for an acute visit, patient complaining of a sore throat. Rapid strep test today negative. Patient reports having a fever, did not check with a thermometer. Denies cough, runny nose or PND. Taking liquid Tylenol  with no relief.  Patient went to urgent care on 09/28/24 with chest pain to the center described as a stabbing, shortness of breath and wheezing beginning 2 days ago.  Pain does not radiate.  Denies fever, cough congestion, dizziness, lightheadedness.  Endorses a history of COPD, not documented on chart, documentation endorses asthma but patient denies.  Former smoker. On initial triage O2 saturation 85% on room air, patient having difficulty speaking, 2 puffs of albuterol  inhaler given and placed on 2 L of oxygen with no improvement, oxygen increased to 6 L, oxygen level 94% at this time, no change in wheezing heard to auscultation, EKG showing normal sinus rhythm and EMS called for emergency department transport. Patient left ED without being seen.  O2 today 90%. Patient reports having difficult swallowing due to sore throat. Tonsil swollen and erythematous. Will send in Amoxicillin . Patient reports frequent episodes of strep throat as a child. Will send referral to ENT.   Sore Throat  This is a new problem. The current episode started in the past 7 days. The problem has been gradually worsening. Neither side of throat is experiencing more pain than the other. Pertinent negatives include no abdominal pain, congestion, coughing, diarrhea, ear pain, headaches or shortness of breath. She has tried acetaminophen  for the symptoms. The treatment provided no relief.    No other concerns at this time.   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   CARPAL TUNNEL RELEASE Right 01/27/2017   Procedure: CARPAL TUNNEL RELEASE;  Surgeon: Helayne Glenn, MD;  Location: [facility];  Service: Orthopedics;  Laterality: Right;   COLON SURGERY  2014   bowel resection/colostomy   COLOSTOMY REVISION N/A 11/07/2013   Procedure: COLON RESECTION SIGMOID;  Surgeon: Lynwood MALVA Pina, MD;  Location: [facility];  Service: General;  Laterality: N/A;   GASTRIC ROUX-EN-Y N/A 01/29/2020   Procedure: LAPAROSCOPIC ROUX-EN-Y GASTRIC BYPASS WITH UPPER ENDOSCOPY, REDUCTION of Incarcerated Hernia, Laproscopic Open Repair of Large Lower 10 cm hernia;  Surgeon: Gladis Cough, MD;  Location: [facility];  Service: General;  Laterality: N/A;   INCISIONAL HERNIA REPAIR Bilateral 04/03/2022   Procedure: OPEN RECURRENT INCISIONAL HERNIA REPAIR WITH AND BILATERAL POSTERIOR RECTUS MYOFASCIAL RELEASE;  Surgeon: Lyndel Deward PARAS, MD;  Location: [facility];  Service: General;  Laterality: Bilateral;   INSERTION OF MESH N/A 04/03/2022   Procedure: INSERTION OF MESH;  Surgeon: Lyndel Deward PARAS, MD;  Location: [facility];  Service: General;  Laterality: N/A;   PANNICULECTOMY N/A 04/03/2022   Procedure: PANNICULECTOMY;  Surgeon: Elisabeth Craig RAMAN, MD;  Location: [facility];  Service: Plastics;  Laterality: N/A;    Social History   Socioeconomic History   Marital status: Married    Spouse name: Not on file   Number of children: 1   Years of education: Not on file   Highest education level: Not on file  Occupational History   Not on file  Tobacco Use    Smoking status: Former    Current packs/day: 0.00    Average packs/day: 1 pack/day for 18.0 years (18.0 ttl pk-yrs)    Types: Cigarettes    Start date: 11/18/2003    Quit date: 11/17/2021    Years since quitting: 2.8   Smokeless tobacco: Never  Vaping Use   Vaping status: Never Used  Substance and Sexual Activity   Alcohol use: No   Drug use: No   Sexual activity: Yes    Birth control/protection: I.U.D.  Other Topics Concern   Not on file  Social History Narrative   Not on file   Social Drivers of Health   Financial Resource Strain: Not on file  Food Insecurity: Not on file  Transportation Needs: Not on file  Physical Activity: Not on file  Stress: Not on file  Social Connections: Not on file  Intimate Partner Violence: Not on file    Family History  Problem Relation Age of Onset   Cancer Maternal Grandfather        prostate   Cancer Maternal Aunt        breast   Breast cancer Maternal Aunt    Breast cancer Paternal Aunt     Allergies  Allergen Reactions   Hand Sanitizer [Flavoring Agent] Hives   Codeine     HALLUCINATIONS   Morphine  And Codeine Itching   Phenergan [Promethazine Hcl] Nausea And Vomiting    Outpatient Medications Prior to Visit  Medication Sig   acetaminophen  (TYLENOL ) 160 MG/5ML liquid Take 500 mg by mouth every 4 (four) hours as needed for pain.   atorvastatin  (LIPITOR) 40 MG tablet TAKE ONE TABLET (40 MG TOTAL) BY MOUTH DAILY.   CALCIUM  PO Take 650 mg by mouth daily.   cetirizine  (ZYRTEC ) 10 MG tablet TAKE ONE TABLET BY MOUTH EVERY NIGHT AT BEDTIME FOR ALLERGIES   Cholecalciferol (VITAMIN D) 50 MCG (2000 UT) tablet Take 2,000 Units by mouth daily.   citalopram  (CELEXA ) 40 MG tablet TAKE ONE TABLET (40 MG TOTAL) BY MOUTH DAILY.   fluticasone -salmeterol (ADVAIR DISKUS) 250-50 MCG/ACT AEPB Inhale 1 puff into the lungs in the morning and at bedtime.   gabapentin  (NEURONTIN ) 300 MG capsule Take 300 mg by mouth at bedtime.   hydrOXYzine  (ATARAX) 10 MG tablet TAKE ONE HALF (1/2) TO ONE TABLET BY MOUTH TWO TIMES DAILY AS NEEDED FOR ANXIETY/MOMENTS OF HIGH ANXIETY   lamoTRIgine  (LAMICTAL ) 200 MG tablet TAKE ONE TABLET (200 MG TOTAL) BY MOUTH THREE TIMES DAILY.   levETIRAcetam  (KEPPRA ) 500 MG tablet TAKE ONE TABLET BY MOUTH EVERY NIGHT AT BEDTIME   losartan  (COZAAR ) 25 MG tablet TAKE ONE TABLET BY MOUTH ONCE A DAY FOR KIDNEY PROTECTION   Multiple Vitamins-Minerals (BARIATRIC MULTIVITAMINS/IRON PO) Take 1 tablet by mouth daily.   PROAIR  HFA 108 (90 Base) MCG/ACT inhaler Inhale 2 puffs into the lungs every 6 (six) hours as needed for wheezing or shortness of breath.   VITAMIN A PO Take 6,000 Units by mouth daily.  vitamin B-12 (CYANOCOBALAMIN) 1000 MCG tablet Take 1,000 mcg by mouth 2 (two) times daily.   vitamin C (ASCORBIC ACID) 500 MG tablet Take 500 mg by mouth daily.   No facility-administered medications prior to visit.    Review of Systems  Constitutional: Negative.   HENT:  Positive for sore throat. Negative for congestion, ear pain and sinus pain.   Eyes: Negative.   Respiratory: Negative.  Negative for cough and shortness of breath.   Cardiovascular: Negative.  Negative for chest pain.  Gastrointestinal: Negative.  Negative for abdominal pain, constipation and diarrhea.  Genitourinary: Negative.   Musculoskeletal:  Negative for joint pain and myalgias.  Skin: Negative.   Neurological: Negative.  Negative for dizziness and headaches.  Endo/Heme/Allergies: Negative.   All other systems reviewed and are negative.      Objective:   BP 118/62   Pulse 88   Ht 5' 5 (1.651 m)   Wt 224 lb (101.6 kg)   SpO2 90%   BMI 37.28 kg/m   Vitals:   10/02/24 1030  BP: 118/62  Pulse: 88  Height: 5' 5 (1.651 m)  Weight: 224 lb (101.6 kg)  SpO2: 90%  BMI (Calculated): 37.28    Physical Exam Vitals and nursing note reviewed.  Constitutional:      Appearance: Normal appearance. She is normal weight.  HENT:      Head: Normocephalic and atraumatic.     Nose: Nose normal.     Mouth/Throat:     Mouth: Mucous membranes are moist.     Pharynx: Pharyngeal swelling and posterior oropharyngeal erythema present.     Tonsils: Tonsillar exudate and tonsillar abscess present. 2+ on the right. 2+ on the left.  Eyes:     Extraocular Movements: Extraocular movements intact.     Conjunctiva/sclera: Conjunctivae normal.     Pupils: Pupils are equal, round, and reactive to light.  Cardiovascular:     Rate and Rhythm: Normal rate and regular rhythm.     Pulses: Normal pulses.     Heart sounds: Normal heart sounds.  Pulmonary:     Effort: Pulmonary effort is normal.     Breath sounds: Normal breath sounds.  Abdominal:     General: Abdomen is flat. Bowel sounds are normal.     Palpations: Abdomen is soft.  Musculoskeletal:        General: Normal range of motion.     Cervical back: Normal range of motion.  Skin:    General: Skin is warm and dry.  Neurological:     General: No focal deficit present.     Mental Status: She is alert and oriented to person, place, and time.  Psychiatric:        Mood and Affect: Mood normal.        Behavior: Behavior normal.        Thought Content: Thought content normal.        Judgment: Judgment normal.      Results for orders placed or performed in visit on 10/02/24  POCT rapid strep A  Result Value Ref Range   Rapid Strep A Screen Negative Negative    Recent Results (from the past 2160 hours)  POCT rapid strep A     Status: Normal   Collection Time: 10/02/24 10:41 AM  Result Value Ref Range   Rapid Strep A Screen Negative Negative      Assessment & Plan:  Amoxicillin  Referral to ENT  Problem List Items Addressed This Visit  Respiratory   Recurrent streptococcal tonsillitis   Relevant Orders   Ambulatory referral to ENT     Other   Sore throat - Primary   Relevant Orders   POCT rapid strep A (Completed)   Ambulatory referral to ENT   Swollen  tonsil   Relevant Orders   Ambulatory referral to ENT    Return in about 4 weeks (around 10/30/2024).   Total time spent: 25 minutes. This time includes review of previous notes and results and patient face to face interaction during today's visit.    Jeoffrey Pollen, NP  10/02/2024   This document may have been prepared by Dragon Voice Recognition software and as such may include unintentional dictation errors.

## 2024-10-19 ENCOUNTER — Other Ambulatory Visit: Payer: Self-pay | Admitting: Cardiology

## 2024-10-19 DIAGNOSIS — F419 Anxiety disorder, unspecified: Secondary | ICD-10-CM

## 2024-10-20 ENCOUNTER — Other Ambulatory Visit: Payer: Self-pay | Admitting: Cardiology

## 2024-10-24 ENCOUNTER — Other Ambulatory Visit: Payer: Self-pay | Admitting: Unknown Physician Specialty

## 2024-10-24 ENCOUNTER — Encounter: Payer: Self-pay | Admitting: Anesthesiology

## 2025-01-02 ENCOUNTER — Encounter
Admission: RE | Admit: 2025-01-02 | Discharge: 2025-01-02 | Disposition: A | Discharge status: Home / Self Care | Source: Ambulatory Visit | Attending: Unknown Physician Specialty | Admitting: Unknown Physician Specialty

## 2025-01-02 ENCOUNTER — Other Ambulatory Visit: Payer: Self-pay

## 2025-01-02 DIAGNOSIS — E119 Type 2 diabetes mellitus without complications: Secondary | ICD-10-CM | POA: Insufficient documentation

## 2025-01-02 DIAGNOSIS — Z01812 Encounter for preprocedural laboratory examination: Secondary | ICD-10-CM | POA: Diagnosis not present

## 2025-01-02 DIAGNOSIS — J351 Hypertrophy of tonsils: Secondary | ICD-10-CM

## 2025-01-02 DIAGNOSIS — J984 Other disorders of lung: Secondary | ICD-10-CM

## 2025-01-02 DIAGNOSIS — Z01818 Encounter for other preprocedural examination: Secondary | ICD-10-CM | POA: Diagnosis present

## 2025-01-02 HISTORY — DX: Pneumonia, unspecified organism: J18.9

## 2025-01-02 HISTORY — DX: Dyspnea, unspecified: R06.00

## 2025-01-02 LAB — BASIC METABOLIC PANEL WITH GFR
Anion gap: 12 (ref 5–15)
BUN: 6 mg/dL (ref 6–20)
CO2: 24 mmol/L (ref 22–32)
Calcium: 9.2 mg/dL (ref 8.9–10.3)
Chloride: 101 mmol/L (ref 98–111)
Creatinine, Ser: 0.59 mg/dL (ref 0.44–1.00)
GFR, Estimated: >60 mL/min
Glucose, Bld: 101 mg/dL — ABNORMAL HIGH (ref 70–99)
Potassium: 3.8 mmol/L (ref 3.5–5.1)
Sodium: 137 mmol/L (ref 135–145)

## 2025-01-02 LAB — CBC
HCT: 45.7 % (ref 36.0–46.0)
Hemoglobin: 15 g/dL (ref 12.0–15.0)
MCH: 28.5 pg (ref 26.0–34.0)
MCHC: 32.8 g/dL (ref 30.0–36.0)
MCV: 86.9 fL (ref 80.0–100.0)
Platelets: 350 K/uL (ref 150–400)
RBC: 5.26 MIL/uL — ABNORMAL HIGH (ref 3.87–5.11)
RDW: 13.5 % (ref 11.5–15.5)
WBC: 9.4 K/uL (ref 4.0–10.5)
nRBC: 0 % (ref 0.0–0.2)

## 2025-01-02 NOTE — Patient Instructions (Addendum)
 Your procedure is scheduled on: 01/09/25 - Tuesday Report to the Registration Desk on the 1st floor of the [facility]. To find out your arrival time, please call [phone] between 1PM - 3PM on: 01/08/25 - Monday If your arrival time is 6:00 am, do not arrive before that time as the [facility] entrance doors do not open until 6:00 am.  REMEMBER: Instructions that are not followed completely may result in serious medical risk, up to and including death; or upon the discretion of your surgeon and anesthesiologist your surgery may need to be rescheduled.  Do not eat food or drink any liquids after midnight the night before surgery.  No gum chewing or hard candies.  One week prior to surgery: Stop Anti-inflammatories (NSAIDS) such as Advil, Aleve, Ibuprofen, Motrin, Naproxen, Naprosyn and Aspirin based products such as Excedrin, Goody's Powder, BC Powder. You may continue to take Tylenol  if needed for pain up until the day of surgery.  Stop ANY OVER THE COUNTER supplements until after surgery.  ON THE DAY OF SURGERY ONLY TAKE THESE MEDICATIONS WITH SIPS OF WATER:  lamoTRIgine  (LAMICTAL )  ADVAIR DISKUS   Use inhalers on the day of surgery and bring to the hospital.  No Alcohol for 24 hours before or after surgery.  No Smoking including e-cigarettes for 24 hours before surgery.  No chewable tobacco products for at least 6 hours before surgery.  No nicotine patches on the day of surgery.  Do not use any recreational drugs for at least a week (preferably 2 weeks) before your surgery.  Please be advised that the combination of cocaine and anesthesia may have negative outcomes, up to and including death. If you test positive for cocaine, your surgery will be cancelled.  On the morning of surgery brush your teeth with toothpaste and water, you may rinse your mouth with mouthwash if you wish. Do not swallow any toothpaste or mouthwash.  Do not wear jewelry, make-up, hairpins, clips  or nail polish.  For welded (permanent) jewelry: bracelets, anklets, waist bands, etc.  Please have this removed prior to surgery.  If it is not removed, there is a chance that hospital personnel will need to cut it off on the day of surgery.  Do not wear lotions, powders, or perfumes.   Do not shave body hair from the neck down 48 hours before surgery.  Contact lenses, hearing aids and dentures may not be worn into surgery.  Do not bring valuables to the hospital. [facility] is not responsible for any missing/lost belongings or valuables.   Bring your C-PAP to the hospital in case you may have to spend the night.   Notify your doctor if there is any change in your medical condition (cold, fever, infection).  Wear comfortable clothing (specific to your surgery type) to the hospital.  After surgery, you can help prevent lung complications by doing breathing exercises.  Take deep breaths and cough every 1-2 hours. Your doctor may order a device called an Incentive Spirometer to help you take deep breaths.  If you are being admitted to the hospital overnight, leave your suitcase in the car. After surgery it may be brought to your room.  In case of increased patient census, it may be necessary for you, the patient, to continue your postoperative care in the Same Day Surgery department.  If you are being discharged the day of surgery, you will not be allowed to drive home. You will need a responsible individual to drive you  home and stay with you for 24 hours after surgery.   If you are taking public transportation, you will need to have a responsible individual with you.  Please call the Pre-admissions Testing Dept. at [phone] if you have any questions about these instructions.  Surgery Visitation Policy:  Patients having surgery or a procedure may have two visitors.  Children under the age of 23 must have an adult with them who is not the patient.  Inpatient Visitation:     Visiting hours are 7 a.m. to 8 p.m. Up to four visitors are allowed at one time in a patient room. The visitors may rotate out with other people during the day.  One visitor age 16 or older may stay with the patient overnight and must be in the room by 8 p.m.   Merchandiser, Retail to address health-related social needs:  [email or web address]

## 2025-01-08 MED ORDER — SODIUM CHLORIDE 0.9 % IV SOLN: INTRAVENOUS | Status: DC

## 2025-01-08 MED ORDER — ACETAMINOPHEN 500 MG PO TABS: 1000.0000 mg | ORAL_TABLET | Freq: Once | ORAL | Status: DC

## 2025-01-09 ENCOUNTER — Encounter: Admitting: Anesthesiology

## 2025-01-09 ENCOUNTER — Encounter: Payer: Self-pay | Admitting: Unknown Physician Specialty

## 2025-01-09 ENCOUNTER — Other Ambulatory Visit: Payer: Self-pay

## 2025-01-09 ENCOUNTER — Encounter
Admission: RE | Disposition: A | Discharge status: Home / Self Care | Payer: Self-pay | Source: Home / Self Care | Attending: Unknown Physician Specialty

## 2025-01-09 ENCOUNTER — Ambulatory Visit: Admission: RE | Admit: 2025-01-09 | Source: Home / Self Care | Admitting: Unknown Physician Specialty

## 2025-01-09 ENCOUNTER — Ambulatory Visit: Payer: Self-pay | Admitting: Urgent Care

## 2025-01-09 DIAGNOSIS — J984 Other disorders of lung: Secondary | ICD-10-CM

## 2025-01-09 DIAGNOSIS — J358 Other chronic diseases of tonsils and adenoids: Secondary | ICD-10-CM | POA: Diagnosis present

## 2025-01-09 DIAGNOSIS — J3501 Chronic tonsillitis: Secondary | ICD-10-CM | POA: Insufficient documentation

## 2025-01-09 DIAGNOSIS — Z01812 Encounter for preprocedural laboratory examination: Secondary | ICD-10-CM

## 2025-01-09 DIAGNOSIS — E119 Type 2 diabetes mellitus without complications: Secondary | ICD-10-CM

## 2025-01-09 LAB — GLUCOSE, CAPILLARY
Glucose-Capillary: 99 mg/dL (ref 70–99)
Glucose-Capillary: 122 mg/dL — ABNORMAL HIGH (ref 70–99)

## 2025-01-09 LAB — POCT PREGNANCY, URINE: Preg Test, Ur: NEGATIVE

## 2025-01-09 MED ORDER — VASOPRESSIN 20 UNIT/ML IV SOLN
INTRAVENOUS | Status: AC
  Filled 2025-01-09: qty 1

## 2025-01-09 MED ORDER — HYDROCODONE-ACETAMINOPHEN 5-325 MG PO TABS
ORAL_TABLET | ORAL | Status: AC
  Filled 2025-01-09: qty 2

## 2025-01-09 MED ORDER — ROCURONIUM BROMIDE 100 MG/10ML IV SOLN
INTRAVENOUS | Status: DC | PRN
  Administered 2025-01-09: 30 mg via INTRAVENOUS

## 2025-01-09 MED ORDER — ACETAMINOPHEN 10 MG/ML IV SOLN
INTRAVENOUS | Status: AC
  Filled 2025-01-09: qty 100

## 2025-01-09 MED ORDER — ALBUTEROL SULFATE HFA 108 (90 BASE) MCG/ACT IN AERS
INHALATION_SPRAY | RESPIRATORY_TRACT | Status: DC | PRN
  Administered 2025-01-09 (×2): 2 via RESPIRATORY_TRACT

## 2025-01-09 MED ORDER — 0.9 % SODIUM CHLORIDE (POUR BTL) OPTIME
TOPICAL | Status: DC | PRN
  Administered 2025-01-09: 120 mL

## 2025-01-09 MED ORDER — PROPOFOL 10 MG/ML IV BOLUS
INTRAVENOUS | Status: DC | PRN
  Administered 2025-01-09: 200 mg via INTRAVENOUS

## 2025-01-09 MED ORDER — PHENYLEPHRINE 80 MCG/ML (10ML) SYRINGE FOR IV PUSH (FOR BLOOD PRESSURE SUPPORT)
PREFILLED_SYRINGE | INTRAVENOUS | Status: AC
  Filled 2025-01-09: qty 10

## 2025-01-09 MED ORDER — OXYCODONE HCL 5 MG PO TABS: 5.0000 mg | ORAL_TABLET | Freq: Once | ORAL | Status: DC | PRN

## 2025-01-09 MED ORDER — ROCURONIUM BROMIDE 10 MG/ML (PF) SYRINGE
PREFILLED_SYRINGE | INTRAVENOUS | Status: AC
  Filled 2025-01-09: qty 10

## 2025-01-09 MED ORDER — DEXAMETHASONE SOD PHOSPHATE PF 10 MG/ML IJ SOLN
INTRAMUSCULAR | Status: DC | PRN
  Administered 2025-01-09: 10 mg via INTRAVENOUS

## 2025-01-09 MED ORDER — GLYCOPYRROLATE 0.2 MG/ML IJ SOLN
INTRAMUSCULAR | Status: AC
  Filled 2025-01-09: qty 1

## 2025-01-09 MED ORDER — ACETAMINOPHEN 10 MG/ML IV SOLN
INTRAVENOUS | Status: DC | PRN
  Administered 2025-01-09: 1000 mg via INTRAVENOUS

## 2025-01-09 MED ORDER — HYDROCODONE-ACETAMINOPHEN 7.5-325 MG PO TABS
1.0000 | ORAL_TABLET | Freq: Four times a day (QID) | ORAL | Status: DC | PRN
  Administered 2025-01-09: 1 via ORAL

## 2025-01-09 MED ORDER — FENTANYL CITRATE (PF) 100 MCG/2ML IJ SOLN: 25.0000 ug | INTRAMUSCULAR | Status: DC | PRN

## 2025-01-09 MED ORDER — ALBUTEROL SULFATE HFA 108 (90 BASE) MCG/ACT IN AERS
INHALATION_SPRAY | RESPIRATORY_TRACT | Status: AC
  Filled 2025-01-09: qty 6.7

## 2025-01-09 MED ORDER — MIDAZOLAM HCL (PF) 2 MG/2ML IJ SOLN
INTRAMUSCULAR | Status: DC | PRN
  Administered 2025-01-09: 2 mg via INTRAVENOUS

## 2025-01-09 MED ORDER — PROPOFOL 10 MG/ML IV BOLUS
INTRAVENOUS | Status: AC
  Filled 2025-01-09: qty 20

## 2025-01-09 MED ORDER — FENTANYL CITRATE (PF) 100 MCG/2ML IJ SOLN
INTRAMUSCULAR | Status: AC
  Filled 2025-01-09: qty 2

## 2025-01-09 MED ORDER — ACETAMINOPHEN 10 MG/ML IV SOLN: 1000.0000 mg | Freq: Once | INTRAVENOUS | Status: DC | PRN

## 2025-01-09 MED ORDER — MIDAZOLAM HCL 2 MG/2ML IJ SOLN
INTRAMUSCULAR | Status: AC
  Filled 2025-01-09: qty 2

## 2025-01-09 MED ORDER — LIDOCAINE HCL (PF) 2 % IJ SOLN
INTRAMUSCULAR | Status: AC
  Filled 2025-01-09: qty 15

## 2025-01-09 MED ORDER — DEXAMETHASONE SOD PHOSPHATE PF 10 MG/ML IJ SOLN
INTRAMUSCULAR | Status: AC
  Filled 2025-01-09: qty 4

## 2025-01-09 MED ORDER — ROCURONIUM BROMIDE 10 MG/ML (PF) SYRINGE
PREFILLED_SYRINGE | INTRAVENOUS | Status: AC
  Filled 2025-01-09: qty 40

## 2025-01-09 MED ORDER — BUPIVACAINE HCL (PF) 0.5 % IJ SOLN
INTRAMUSCULAR | Status: AC
  Filled 2025-01-09: qty 30

## 2025-01-09 MED ORDER — DROPERIDOL 2.5 MG/ML IJ SOLN: 0.6250 mg | Freq: Once | INTRAMUSCULAR | Status: DC | PRN

## 2025-01-09 MED ORDER — BUPIVACAINE HCL (PF) 0.5 % IJ SOLN
INTRAMUSCULAR | Status: DC | PRN
  Administered 2025-01-09: 6 mL

## 2025-01-09 MED ORDER — LIDOCAINE HCL (CARDIAC) PF 100 MG/5ML IV SOSY
PREFILLED_SYRINGE | INTRAVENOUS | Status: DC | PRN
  Administered 2025-01-09: 100 mg via INTRAVENOUS

## 2025-01-09 MED ORDER — DEXMEDETOMIDINE HCL IN NACL 80 MCG/20ML IV SOLN
INTRAVENOUS | Status: DC | PRN
  Administered 2025-01-09: 12 ug via INTRAVENOUS

## 2025-01-09 MED ORDER — DEXMEDETOMIDINE HCL IN NACL 80 MCG/20ML IV SOLN
INTRAVENOUS | Status: AC
  Filled 2025-01-09: qty 20

## 2025-01-09 MED ORDER — SUCCINYLCHOLINE CHLORIDE 200 MG/10ML IV SOSY
PREFILLED_SYRINGE | INTRAVENOUS | Status: DC | PRN
  Administered 2025-01-09: 100 mg via INTRAVENOUS

## 2025-01-09 MED ORDER — HYDROCODONE-ACETAMINOPHEN 7.5-325 MG/15ML PO SOLN: 15.0000 mL | Freq: Four times a day (QID) | ORAL | 0 refills | Status: AC | PRN

## 2025-01-09 MED ORDER — SUCCINYLCHOLINE CHLORIDE 200 MG/10ML IV SOSY
PREFILLED_SYRINGE | INTRAVENOUS | Status: AC
  Filled 2025-01-09: qty 10

## 2025-01-09 MED ORDER — GLYCOPYRROLATE 0.2 MG/ML IJ SOLN
INTRAMUSCULAR | Status: DC | PRN
  Administered 2025-01-09: .2 mg via INTRAVENOUS

## 2025-01-09 MED ORDER — SUGAMMADEX SODIUM 200 MG/2ML IV SOLN
INTRAVENOUS | Status: DC | PRN
  Administered 2025-01-09: 400 mg via INTRAVENOUS

## 2025-01-09 MED ORDER — ONDANSETRON HCL 4 MG/2ML IJ SOLN
INTRAMUSCULAR | Status: DC | PRN
  Administered 2025-01-09: 4 mg via INTRAVENOUS

## 2025-01-09 MED ORDER — EPHEDRINE 5 MG/ML INJ
INTRAVENOUS | Status: AC
  Filled 2025-01-09: qty 10

## 2025-01-09 MED ORDER — OXYCODONE HCL 5 MG/5ML PO SOLN: 5.0000 mg | Freq: Once | ORAL | Status: DC | PRN

## 2025-01-09 MED ORDER — FENTANYL CITRATE (PF) 100 MCG/2ML IJ SOLN
INTRAMUSCULAR | Status: DC | PRN
  Administered 2025-01-09 (×2): 50 ug via INTRAVENOUS

## 2025-01-09 MED ORDER — ONDANSETRON HCL 4 MG/2ML IJ SOLN
INTRAMUSCULAR | Status: AC
  Filled 2025-01-09: qty 6

## 2025-01-09 NOTE — Op Note (Signed)
 PREOPERATIVE DIAGNOSIS:  Tonsillitis, chronic  POSTOPERATIVE DIAGNOSIS:  Chronic Tonsillitis  OPERATION:  Tonsillectomy.  SURGEON:  Chinita IVAR Hasten, MD  ANESTHESIA:  General endotracheal.  OPERATIVE FINDINGS:  Large tonsils.  DESCRIPTION OF THE PROCEDURE: Rachel [name] was identified in the holding area and taken to the operating room and placed in the supine position.  After general endotracheal anesthesia, the table was turned 45 degrees and the patient was draped in the usual fashion for a tonsillectomy.  A mouth gag was inserted into the oral cavity.  There were large tonsils.  Beginning on the left-hand side a tenaculum was used to grasp the tonsil and the Bovie cautery was used to dissect it free from the fossa.  In a similar fashion, the right tonsil was removed.  Meticulous hemostasis was achieved using the Bovie cautery.  With both tonsils removed and no active bleeding, 0.5% plain Marcaine  was used to inject the anterior and posterior tonsillar pillars bilaterally.  A total of 6ml was used.  The patient tolerated the procedure well and was awakened in the operating room and taken to the recovery room in stable condition.   CULTURES:  None.  SPECIMENS:  Tonsils.  ESTIMATED BLOOD LOSS:  Less than 10 ml.  Chinita ONEIDA Hasten  01/09/2025  12:45 PM

## 2025-01-09 NOTE — Anesthesia Postprocedure Evaluation (Signed)
"   Anesthesia Post Note  Patient: Rachel [name]  Procedure(s) Performed: TONSILLECTOMY (Bilateral)  Patient location during evaluation: PACU Anesthesia Type: General Level of consciousness: awake and alert Pain management: pain level controlled Vital Signs Assessment: post-procedure vital signs reviewed and stable Respiratory status: spontaneous breathing, nonlabored ventilation and respiratory function stable Cardiovascular status: blood pressure returned to baseline and stable Postop Assessment: no apparent nausea or vomiting Anesthetic complications: no   No notable events documented.   Last Vitals:  Vitals:   01/09/25 1315 01/09/25 1330  BP: 102/70 99/60  Pulse: 78 76  Resp: 14 14  Temp:  36.6 C  SpO2: 93% 94%    Last Pain:  Vitals:   01/09/25 1330  TempSrc:   PainSc: 6                  Zoriyah Scheidegger [name]      "

## 2025-01-09 NOTE — Transfer of Care (Signed)
 Immediate Anesthesia Transfer of Care Note  Patient: Rachel [name]  Procedure(s) Performed: TONSILLECTOMY (Bilateral)  Patient Location: PACU  Anesthesia Type:General  Level of Consciousness: drowsy and patient cooperative  Airway & Oxygen Therapy: Patient Spontanous Breathing and Patient connected to face mask oxygen  Post-op Assessment: Report given to RN, Post -op Vital signs reviewed and stable, and Patient moving all extremities X 4  Post vital signs: Reviewed and stable  Last Vitals:  Vitals Value Taken Time  BP 121/54 01/09/25 12:56  Temp    Pulse 86 01/09/25 12:59  Resp 16 01/09/25 12:59  SpO2 94 % 01/09/25 12:59  Vitals shown include unfiled device data.  Last Pain:  Vitals:   01/09/25 1026  TempSrc: Temporal  PainSc: 0-No pain         Complications: No notable events documented.

## 2025-01-09 NOTE — Anesthesia Procedure Notes (Signed)
 Procedure Name: Intubation Date/Time: 01/09/2025 12:27 PM  Performed by: Lennie Lamarr HERO, CRNAPre-anesthesia Checklist: Patient identified, Emergency Drugs available, Suction available and Patient being monitored Patient Re-evaluated:Patient Re-evaluated prior to induction Oxygen Delivery Method: Circle System Utilized Preoxygenation: Pre-oxygenation with 100% oxygen Induction Type: IV induction Ventilation: Mask ventilation without difficulty Laryngoscope Size: McGrath and 4 Grade View: Grade I Tube type: Oral Rae Tube size: 7.0 mm Number of attempts: 1 Airway Equipment and Method: Stylet and Oral airway Placement Confirmation: ETT inserted through vocal cords under direct vision, positive ETCO2 and breath sounds checked- equal and bilateral Secured at: 22 cm Tube secured with: Tape Dental Injury: Teeth and Oropharynx as per pre-operative assessment

## 2025-01-09 NOTE — H&P (Signed)
 The patient's history has been reviewed, patient examined, no change in status, stable for surgery.  Questions were answered to the patients satisfaction.

## 2025-01-09 NOTE — Anesthesia Preprocedure Evaluation (Addendum)
"                                    Anesthesia Evaluation  Patient identified by MRN, date of birth, ID band Patient awake    Reviewed: Allergy & Precautions, NPO status , Patient's Chart, lab work & pertinent test results  Airway Mallampati: I  TM Distance: >3 FB Neck ROM: Full    Dental no notable dental hx. (+) Teeth Intact, Dental Advisory Given   Pulmonary asthma , sleep apnea , COPD,  COPD inhaler, Current SmokerPatient did not abstain from smoking.   Pulmonary exam normal breath sounds clear to auscultation       Cardiovascular negative cardio ROS Normal cardiovascular exam Rhythm:Regular Rate:Normal     Neuro/Psych  Headaches, Seizures -, Well Controlled,  PSYCHIATRIC DISORDERS  Depression Bipolar Disorder      GI/Hepatic Neg liver ROS,,,History of Roux-en-Y gastric bypass   Endo/Other  diabetes    Renal/GU negative Renal ROS  negative genitourinary   Musculoskeletal negative musculoskeletal ROS (+)    Abdominal   Peds  Hematology negative hematology ROS (+)   Anesthesia Other Findings   Reproductive/Obstetrics                              Anesthesia Physical Anesthesia Plan  ASA: 3  Anesthesia Plan: General   Post-op Pain Management: Minimal or no pain anticipated   Induction: Intravenous  PONV Risk Score and Plan: 3 and Midazolam , Dexamethasone  and Ondansetron   Airway Management Planned: Oral ETT  Additional Equipment:   Intra-op Plan:   Post-operative Plan: Extubation in OR  Informed Consent: I have reviewed the patients History and Physical, chart, labs and discussed the procedure including the risks, benefits and alternatives for the proposed anesthesia with the patient or authorized representative who has indicated his/her understanding and acceptance.     Dental advisory given  Plan Discussed with: CRNA  Anesthesia Plan Comments: ( )         Anesthesia Quick Evaluation  "

## 2025-01-10 ENCOUNTER — Encounter: Payer: Self-pay | Admitting: Unknown Physician Specialty

## 2025-01-11 LAB — SURGICAL PATHOLOGY

## 2025-01-15 NOTE — Addendum Note (Signed)
 Addendum  created 01/15/25 1705 by Vicci Camellia Glatter, MD   Attestation recorded in Intraprocedure, Intraprocedure Attestations deleted, Intraprocedure Attestations filed

## 2025-01-22 ENCOUNTER — Ambulatory Visit: Admitting: Cardiology
